# Patient Record
Sex: Male | Born: 1980 | Race: White | Hispanic: No | Marital: Single | Smoking: Current every day smoker
Health system: Southern US, Community
[De-identification: ages and names within clinical notes are randomized; demographics above are authoritative.]

## PROBLEM LIST (undated history)

## (undated) DIAGNOSIS — IMO0001 Reserved for inherently not codable concepts without codable children: Secondary | ICD-10-CM

## (undated) DIAGNOSIS — I10 Essential (primary) hypertension: Secondary | ICD-10-CM

## (undated) DIAGNOSIS — M545 Low back pain, unspecified: Secondary | ICD-10-CM

## (undated) DIAGNOSIS — J449 Chronic obstructive pulmonary disease, unspecified: Secondary | ICD-10-CM

## (undated) DIAGNOSIS — M47814 Spondylosis without myelopathy or radiculopathy, thoracic region: Secondary | ICD-10-CM

## (undated) DIAGNOSIS — F411 Generalized anxiety disorder: Secondary | ICD-10-CM

## (undated) DIAGNOSIS — E781 Pure hyperglyceridemia: Secondary | ICD-10-CM

## (undated) DIAGNOSIS — L4050 Arthropathic psoriasis, unspecified: Secondary | ICD-10-CM

## (undated) DIAGNOSIS — M47817 Spondylosis without myelopathy or radiculopathy, lumbosacral region: Secondary | ICD-10-CM

## (undated) DIAGNOSIS — F17200 Nicotine dependence, unspecified, uncomplicated: Secondary | ICD-10-CM

## (undated) DIAGNOSIS — M5380 Other specified dorsopathies, site unspecified: Secondary | ICD-10-CM

## (undated) DIAGNOSIS — M542 Cervicalgia: Secondary | ICD-10-CM

## (undated) DIAGNOSIS — M87051 Idiopathic aseptic necrosis of right femur: Secondary | ICD-10-CM

## (undated) HISTORY — DX: Spondylosis without myelopathy or radiculopathy, lumbosacral region: M47.817

## (undated) HISTORY — DX: Cervicalgia: M54.2

## (undated) HISTORY — DX: Reserved for inherently not codable concepts without codable children: IMO0001

## (undated) HISTORY — DX: Generalized anxiety disorder: F41.1

## (undated) HISTORY — DX: Spondylosis without myelopathy or radiculopathy, thoracic region: M47.814

## (undated) HISTORY — DX: Other specified dorsopathies, site unspecified: M53.80

## (undated) HISTORY — DX: Low back pain, unspecified: M54.50

## (undated) HISTORY — PX: TONSILLECTOMY AND ADENOIDECTOMY: SUR1326

## (undated) HISTORY — DX: Pure hyperglyceridemia: E78.1

## (undated) HISTORY — DX: Arthropathic psoriasis, unspecified: L40.50

## (undated) HISTORY — DX: Idiopathic aseptic necrosis of right femur: M87.051

## (undated) HISTORY — DX: Nicotine dependence, unspecified, uncomplicated: F17.200

## (undated) HISTORY — DX: Low back pain: M54.5

## (undated) HISTORY — DX: Essential (primary) hypertension: I10

---

## 2005-02-24 ENCOUNTER — Emergency Department (HOSPITAL_COMMUNITY): Admission: EM | Admit: 2005-02-24 | Discharge: 2005-02-24 | Payer: Self-pay | Admitting: *Deleted

## 2007-04-20 ENCOUNTER — Encounter: Admission: RE | Admit: 2007-04-20 | Discharge: 2007-04-20 | Payer: Self-pay | Admitting: Orthopedic Surgery

## 2008-04-27 ENCOUNTER — Ambulatory Visit (HOSPITAL_COMMUNITY): Admission: RE | Admit: 2008-04-27 | Discharge: 2008-04-28 | Payer: Self-pay | Admitting: Orthopedic Surgery

## 2008-04-29 HISTORY — PX: ROTATOR CUFF REPAIR: SHX139

## 2010-09-11 NOTE — Op Note (Signed)
[name], Tim                ACCOUNT NO.:  [id number]   MEDICAL RECORD NO.:  [id number]          PATIENT TYPE:  AMB   LOCATION:  DAY                          FACILITY:  [facility]   PHYSICIAN:  Ronald A. Gioffre, M.D.DATE OF BIRTH:  1980-05-07   DATE OF PROCEDURE:  04/27/2008  DATE OF DISCHARGE:                               OPERATIVE REPORT   SURGEON:  Georges Lynch. Darrelyn Hillock, M.D.   ASSISTANT:  Jamelle Rushing, P.A.   PREOPERATIVE DIAGNOSES:  1. Torn rotator cuff tendon, left shoulder.  2. Severe impingement syndrome, left shoulder.   POSTOPERATIVE DIAGNOSES:  1. Torn rotator cuff tendon, left shoulder.  2. Severe impingement syndrome, left shoulder.   OPERATION:  1. Partial acromionectomy and acromioplasty, open, left shoulder.  2. Repair of a torn rotator cuff tendon utilizing the TissueMend graft      with one anchor.   PROCEDURE:  Under general anesthesia, routine orthopedic prep and drape  of the left shoulder was carried out.  The patient had 650 mg of  clindamycin IV preop.  He also preop had an interscalene nerve block.  An incision was made over the anterior aspect of the left shoulder,  bleeders identified and cauterized.  I then inserted self-retaining  retractors.  I went down and partially detached the deltoid tendon by  sharp dissection from the acromion.  I noted he had really marked  downsloping of the acromion, the acromion was thickened as well.  I  split a small proximal part of the deltoid tendon and muscle as well.  At this time the [facility] retractor was inserted under the  acromion.  The rotator cuff was protected and I then utilized the oscillating saw  and did a partial acromionectomy and utilized the bur to even out the  undersurface of the acromion.  I then thoroughly irrigated the area and  then bone waxed the undersurface of the acromion.  I went down and  inspected the tendon.  The tendon was markedly thinned out as the MRI  stated.  There was at  least 50% thickness literally absent in regards to  the tendon.  Because of this I then utilized an overlay TissueMend graft  with 1 anchor and had a nice reinforcement of the tendon.  We did  reestablish the subacromial space.  We irrigated the area.  I then  loosely packed some thrombin-soaked Gelfoam up into the subacromial  space.  I reapproximated the deltoid tendon and muscle in the usual  fashion.  Subcu was closed with 0 Vicryl, the skin with metal staples.  Sterile Neosporin dressing was applied.  He was placed in a shoulder  immobilizer.           ______________________________  Georges Lynch Darrelyn Hillock, M.D.     RAG/MEDQ  D:  04/27/2008  T:  04/27/2008  Job:  [id number]

## 2010-10-11 ENCOUNTER — Other Ambulatory Visit (HOSPITAL_COMMUNITY): Payer: Self-pay | Admitting: Neurosurgery

## 2010-10-11 DIAGNOSIS — M542 Cervicalgia: Secondary | ICD-10-CM

## 2010-10-11 DIAGNOSIS — M545 Low back pain, unspecified: Secondary | ICD-10-CM

## 2010-10-11 DIAGNOSIS — M546 Pain in thoracic spine: Secondary | ICD-10-CM

## 2010-10-16 ENCOUNTER — Ambulatory Visit (HOSPITAL_COMMUNITY): Admission: RE | Admit: 2010-10-16 | Payer: Self-pay | Source: Ambulatory Visit

## 2010-10-16 ENCOUNTER — Ambulatory Visit (HOSPITAL_COMMUNITY)
Admission: RE | Admit: 2010-10-16 | Discharge: 2010-10-16 | Disposition: A | Discharge status: Home / Self Care | Payer: BC Managed Care – PPO | Source: Ambulatory Visit | Attending: Neurosurgery | Admitting: Neurosurgery

## 2010-10-16 ENCOUNTER — Ambulatory Visit (HOSPITAL_COMMUNITY): Payer: Self-pay

## 2010-10-16 DIAGNOSIS — M5020 Other cervical disc displacement, unspecified cervical region: Secondary | ICD-10-CM | POA: Insufficient documentation

## 2010-10-16 DIAGNOSIS — M542 Cervicalgia: Secondary | ICD-10-CM | POA: Insufficient documentation

## 2010-10-16 DIAGNOSIS — R209 Unspecified disturbances of skin sensation: Secondary | ICD-10-CM | POA: Insufficient documentation

## 2010-10-16 DIAGNOSIS — M545 Low back pain, unspecified: Secondary | ICD-10-CM

## 2010-10-16 DIAGNOSIS — M546 Pain in thoracic spine: Secondary | ICD-10-CM

## 2010-10-16 DIAGNOSIS — M4120 Other idiopathic scoliosis, site unspecified: Secondary | ICD-10-CM | POA: Insufficient documentation

## 2010-10-16 DIAGNOSIS — M5137 Other intervertebral disc degeneration, lumbosacral region: Secondary | ICD-10-CM | POA: Insufficient documentation

## 2010-10-16 DIAGNOSIS — M48061 Spinal stenosis, lumbar region without neurogenic claudication: Secondary | ICD-10-CM | POA: Insufficient documentation

## 2010-10-16 DIAGNOSIS — M4804 Spinal stenosis, thoracic region: Secondary | ICD-10-CM | POA: Insufficient documentation

## 2010-10-16 DIAGNOSIS — M5090 Cervical disc disorder, unspecified, unspecified cervical region: Secondary | ICD-10-CM | POA: Insufficient documentation

## 2010-10-16 DIAGNOSIS — R0789 Other chest pain: Secondary | ICD-10-CM | POA: Insufficient documentation

## 2010-10-16 DIAGNOSIS — M51379 Other intervertebral disc degeneration, lumbosacral region without mention of lumbar back pain or lower extremity pain: Secondary | ICD-10-CM | POA: Insufficient documentation

## 2010-10-16 DIAGNOSIS — M5124 Other intervertebral disc displacement, thoracic region: Secondary | ICD-10-CM | POA: Insufficient documentation

## 2010-10-16 DIAGNOSIS — M519 Unspecified thoracic, thoracolumbar and lumbosacral intervertebral disc disorder: Secondary | ICD-10-CM | POA: Insufficient documentation

## 2010-10-16 DIAGNOSIS — D1779 Benign lipomatous neoplasm of other sites: Secondary | ICD-10-CM | POA: Insufficient documentation

## 2010-10-18 ENCOUNTER — Encounter: Payer: Self-pay | Admitting: Family Medicine

## 2010-10-18 DIAGNOSIS — I10 Essential (primary) hypertension: Secondary | ICD-10-CM

## 2010-10-18 DIAGNOSIS — L4050 Arthropathic psoriasis, unspecified: Secondary | ICD-10-CM | POA: Insufficient documentation

## 2010-10-18 DIAGNOSIS — F411 Generalized anxiety disorder: Secondary | ICD-10-CM | POA: Insufficient documentation

## 2010-11-16 ENCOUNTER — Ambulatory Visit: Payer: BC Managed Care – PPO | Admitting: Neurosurgery

## 2010-11-21 ENCOUNTER — Encounter: Payer: BC Managed Care – PPO | Attending: Neurosurgery | Admitting: Neurosurgery

## 2010-11-21 DIAGNOSIS — M5020 Other cervical disc displacement, unspecified cervical region: Secondary | ICD-10-CM | POA: Insufficient documentation

## 2010-11-21 DIAGNOSIS — M545 Low back pain, unspecified: Secondary | ICD-10-CM | POA: Insufficient documentation

## 2010-11-21 DIAGNOSIS — M542 Cervicalgia: Secondary | ICD-10-CM | POA: Insufficient documentation

## 2010-11-21 DIAGNOSIS — M546 Pain in thoracic spine: Secondary | ICD-10-CM | POA: Insufficient documentation

## 2010-11-21 NOTE — Progress Notes (Signed)
ACCOUNT:  [id number].  Tim [name] is a 30 year old gentleman referred to [place] through Dr. Lowry Bowl office.  He was evaluated by Dr. Franky Macho for surgical intervention for complaints of back and neck pain.  He has been treated in the past at [facility] by Dr. Ethelene Hal where he received epidural steroid injections.  He tells me he got about a months' relief there, but no more.  Dr. Franky Macho evaluated him and referred him over for pain management due to the fact that he has been treated with narcotics in the past up to and including Dilaudid to another doctor and at some point in the past it had positive drug screen for cocaine.  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  He states that the last drug screen was just before his previous job.  He had passed the drug screen for his job and subsequently was given another drug screen by someone that showed cocaine, and the patient states he does not know how it got into his system.  Therefore, Dr. Franky Macho in conjunction with telling the patient he did not have anything surgical often that he also did not conduct pain management and would not prescribed narcotics.  He was accepted to this clinic on a nonnarcotic basis only.  The patient tells me today that he was not aware of nonnarcotic treatment and he feels like that narcotics should the only thing that helps.  The patient describes pain in his thoracic spine as well as his mid back and low back that radiates into his shoulders.  He rates his pain about 8.  It is a sharp, stabbing, constant pain, with paresthesia like activity.  He rates his activity level as 7.  Pain is the same 24 hours a day.  Sleep patterns are poor.  Bending, sitting, and most activities aggravate.  Heat and medication tend to help along with some injection.  Mobility, walks without assistance.  He climb steps and drive.   He does appear to be able to slowly rising from a seated position.  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  REVIEW OF SYSTEMS:  Notable for the difficulties as described above as well as some weakness, spasms, anxiety.  No suicidal thoughts.  His past physicians are Dr. Tanya Nones, his primary care at [facility], Dr. Franky Macho at [facility], as well as Dr. Ethelene Hal at [facility].  PAST MEDICAL HISTORY:  Significant for high blood pressure.  He has had trigger point injections in the past, epidurals, as well as rotator cuff surgery with Dr. Tinnie Gens in 2007.  SOCIAL HISTORY:  Single.  He drinks alcohol in the amount of one to two beverages a month.  He smokes about pack a day.  FAMILY HISTORY:  Significant for hypertension.  PHYSICAL EXAM:  VITAL SIGNS:  His blood pressure 161/85, pulse 90, respirations 18, O2 sats 99 on room air. CONSTITUTIONAL:  He appears to be within normal limits.  He is alert and oriented x3.  He has a normal gait even though it is a little slow to arise.  He does show me a ganglion cyst on his left wrist that he states it is fairly new and not bothersome to him. NEUROLOGIC:  His motor strength is 5/5 in the upper and lower extremities including deltoid, biceps, triceps, intrinsic,  grip, iliopsoas, quadriceps, dorsiflexors, EHL, and plantar flexors.  He can flex to about 60 degrees, extend about 10 degrees with pain.  His sensation is intact in the upper and lower extremities. He has point tender throughout the entire spine and the paraspinous musculature.  His reflexes are trace at the triceps; absent at the brachioradialis, triceps, biceps bilaterally; he has 2 at the quadriceps, 1 at the gastrocs.  Toes are downgoing.  He has a normal gait and stance.  ASSESSMENT: 1. Cervicalgia, unknown etiology. 2. Thoracic and low back pain.  MRIs dated October 16, 2010  were     reviewed.  Cervical spine shows small central protrusion at C5-6     with no annular tear.  No stenosis and neural foraminal     compression.  Thoracic spine is also negative.  He has a mild     thoracic scoliosis without any acute osseous abnormalities.  Lumbar     spine shows mild lumbar disease and facet degeneration, no spinal     stenosis, recess stenosis, or compression.  RECOMMENDATIONS: 1. We did talk about treatment options that are nonnarcotic.  The     patient states that he was unaware that he was going to be under     nonnarcotic treatment and the narcotics are the only thing that     helps.  We did talk about the future injections as well as     medications. The patient states that if I am not going to prescribe     narcotics "can I give him the name of the pain clinic in town."  I     told the patient that I do not know such an establishment, so I     would not give him the name. 2. He has agreed to nonnarcotic treatment for the time being, so I did     prescribe Soma 350 mg one p.o. b.i.d., #60 with no refill. 3. Celebrex 200 mg one p.o. daily, #30 with no refill, and we are     going to obtain urinary drug screen even though he is nonnarcotic     management.  The patient's questions were encouraged and answered.     We will see him back in the clinic with Dr. Wynn [name] in 1 month.     Tami Barren L. Blima Dessert Electronically Signed    RLW/MedQ D:  11/21/2010 09:50:05  T:  11/21/2010 10:57:51  Job #:  [id number]

## 2010-11-21 NOTE — Progress Notes (Signed)
ADDENDUM  Please add under his current medications amlodipine 10 mg daily and he was also prescribed hydrocodone 5/325 daily and Soma 350 mg 3 times a day.  He states the Tresa Garter was through Dr. Tanya Nones and the hydrocodone was through Dr. Franky Macho and neither physician will prescribe either drug again.  He does report drug allergies to SULFA drugs and it is hard to read this, but it looks like Ceclor.     Darlene Brozowski L. Blima Dessert Electronically Signed    RLW/MedQ D:  11/21/2010 09:51:30  T:  11/21/2010 10:53:27  Job #:  [id number]

## 2010-11-28 ENCOUNTER — Emergency Department (HOSPITAL_COMMUNITY)
Admission: EM | Admit: 2010-11-28 | Discharge: 2010-11-28 | Disposition: A | Discharge status: Home / Self Care | Payer: Worker's Compensation | Attending: Emergency Medicine | Admitting: Emergency Medicine

## 2010-11-28 DIAGNOSIS — R110 Nausea: Secondary | ICD-10-CM | POA: Insufficient documentation

## 2010-11-28 DIAGNOSIS — Z77098 Contact with and (suspected) exposure to other hazardous, chiefly nonmedicinal, chemicals: Secondary | ICD-10-CM | POA: Insufficient documentation

## 2010-11-28 DIAGNOSIS — I10 Essential (primary) hypertension: Secondary | ICD-10-CM | POA: Insufficient documentation

## 2010-12-28 ENCOUNTER — Encounter
Payer: BC Managed Care – PPO | Attending: Physical Medicine & Rehabilitation | Admitting: Physical Medicine & Rehabilitation

## 2010-12-28 DIAGNOSIS — M5380 Other specified dorsopathies, site unspecified: Secondary | ICD-10-CM

## 2010-12-28 DIAGNOSIS — M549 Dorsalgia, unspecified: Secondary | ICD-10-CM | POA: Insufficient documentation

## 2010-12-28 DIAGNOSIS — M5020 Other cervical disc displacement, unspecified cervical region: Secondary | ICD-10-CM | POA: Insufficient documentation

## 2010-12-28 DIAGNOSIS — M51379 Other intervertebral disc degeneration, lumbosacral region without mention of lumbar back pain or lower extremity pain: Secondary | ICD-10-CM | POA: Insufficient documentation

## 2010-12-28 DIAGNOSIS — M5124 Other intervertebral disc displacement, thoracic region: Secondary | ICD-10-CM | POA: Insufficient documentation

## 2010-12-28 DIAGNOSIS — M47814 Spondylosis without myelopathy or radiculopathy, thoracic region: Secondary | ICD-10-CM

## 2010-12-28 DIAGNOSIS — IMO0001 Reserved for inherently not codable concepts without codable children: Secondary | ICD-10-CM

## 2010-12-28 DIAGNOSIS — M47817 Spondylosis without myelopathy or radiculopathy, lumbosacral region: Secondary | ICD-10-CM

## 2010-12-28 DIAGNOSIS — M5137 Other intervertebral disc degeneration, lumbosacral region: Secondary | ICD-10-CM | POA: Insufficient documentation

## 2010-12-28 DIAGNOSIS — M62838 Other muscle spasm: Secondary | ICD-10-CM | POA: Insufficient documentation

## 2010-12-28 NOTE — Assessment & Plan Note (Signed)
HISTORY:  Tim [name] is back regarding his back pain.  He saw my nurse practitioner last month.  Nonnarcotic management was offered due to his past history of cocaine abuse, which was even very recent.  The patient agreed to follow up.  I reviewed his studies and thoracic spine is notable for multilevel facet disease as well as small disk protrusions at the T5-T6, T6-C7 and T8-T9 levels.  The lumbar spine is notable for some mild disk disease, eccentric to the left as the L4-L5 as well as facet hypertrophy at L4-L5, L5-S1.  The patient rates his pain 8/10, states it is stabbing, sharp, and aching.  It is related to his upper mid back as well as his low back. It seems to be worse with working as well as standing and can be worse if he sits for long periods of time.  Sleep is poor.  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  He does not have much time to rest with his work.  He was placed on Celebrex and Soma.  He takes Celebrex once a day and Soma twice a day and states he would do better with more frequent Soma.  REVIEW OF SYSTEMS:  Notable for the above.  He does have some anxiety. Full 12-point review is in the written health and history section of the chart.  SOCIAL HISTORY:  The patient is single.  Working as above.  PHYSICAL EXAMINATION:  VITAL SIGNS:  Blood pressure is 144/79, pulse is 80, respiratory rate 16, and satting 98% on room air. MUSCULOSKELETAL:  The patient has pain to palpation over the mid lumbar spine particularly at the paraspinals.  He has prominence of the paraspinal musculature, particularly at T6, T7, and T8.  He has pain in both paraspinal region at L4-S1.  He has a clockwise rotation of the thoracic and lumbar spine, perhaps by about 10 degrees.  Pain was noted with flexion, although more severe with extension and facet maneuvers of the lumbar spine.  Rotation caused minimal discomfort, but  side bending did cause pain both lower lumbar spine segments.  Strength is 5/5 in all four limbs.  Reflexes are 2+ and sensory exam is intact.  Overall posture is fair except for the spinal rotation.  ASSESSMENT:  Continues thoracic and lumbar spine pain.  It appears to be generally facet mediated in origin, although he has postural issues specifically the spinal clockwise rotation.  There is definite lumbar and thoracic disk disease, although I am not sure how much to role this is playing at this point specially with his lack of response to epidural injections in the past.  PLAN: 1. After informed consent, we injected four areas of spastic     muscle/trigger point in the T6-C7 paraspinals as well as lumbar     paraspinal muscles on either side.  A total of four injections of 2     mL of 1% lidocaine.  The patient tolerated well. 2. Continue his Soma q.8 h and Celebrex to b.i.d. for now. 3. Discussed appropriate stretching techniques particularly bending,     flexion, exercises during work.  He needs to be sensible about his     activities and how he does about things and he is to build in some     time for break some stretching while he works.  The type of work he     is doing is only  going to severely exacerbate the symptoms.     Ideally therapy would be offered here, although the patient states     he has a 50$ copay. 4. We will see the patient back in about 6 weeks' time.  He will call     with any problems or questions.     Ranelle Oyster, M.D. Electronically Signed    ZTS/MedQ D:  12/28/2010 10:30:23  T:  12/28/2010 11:23:30  Job #:  [id number]  cc:   Coletta Memos, M.D. Fax: [phone]

## 2011-02-01 LAB — DIFFERENTIAL
Basophils Absolute: 0 10*3/uL (ref 0.0–0.1)
Basophils Relative: 1 % (ref 0–1)
Eosinophils Absolute: 0.2 10*3/uL (ref 0.0–0.7)
Eosinophils Relative: 3 % (ref 0–5)
Lymphocytes Relative: 29 % (ref 12–46)
Lymphs Abs: 1.9 10*3/uL (ref 0.7–4.0)
Monocytes Absolute: 0.4 10*3/uL (ref 0.1–1.0)
Monocytes Relative: 6 % (ref 3–12)
Neutro Abs: 4 10*3/uL (ref 1.7–7.7)
Neutrophils Relative %: 62 % (ref 43–77)

## 2011-02-01 LAB — APTT: aPTT: 28 seconds (ref 24–37)

## 2011-02-01 LAB — COMPREHENSIVE METABOLIC PANEL
ALT: 82 U/L — ABNORMAL HIGH (ref 0–53)
AST: 44 U/L — ABNORMAL HIGH (ref 0–37)
Albumin: 4.2 g/dL (ref 3.5–5.2)
Alkaline Phosphatase: 79 U/L (ref 39–117)
BUN: 10 mg/dL (ref 6–23)
CO2: 24 mEq/L (ref 19–32)
Calcium: 9.8 mg/dL (ref 8.4–10.5)
Chloride: 103 mEq/L (ref 96–112)
Creatinine, Ser: 0.77 mg/dL (ref 0.4–1.5)
GFR calc Af Amer: >60 mL/min (ref >60)
GFR calc non Af Amer: >60 mL/min (ref >60)
Glucose, Bld: 104 mg/dL — ABNORMAL HIGH (ref 70–99)
Potassium: 3.9 mEq/L (ref 3.5–5.1)
Sodium: 137 mEq/L (ref 135–145)
Total Bilirubin: 0.8 mg/dL (ref 0.3–1.2)
Total Protein: 6.7 g/dL (ref 6.0–8.3)

## 2011-02-01 LAB — URINALYSIS, ROUTINE W REFLEX MICROSCOPIC
Bilirubin Urine: NEGATIVE
Glucose, UA: NEGATIVE mg/dL
Hgb urine dipstick: NEGATIVE
Ketones, ur: NEGATIVE mg/dL
Nitrite: NEGATIVE
Protein, ur: NEGATIVE mg/dL
Specific Gravity, Urine: 1.019 (ref 1.005–1.030)
Urobilinogen, UA: 0.2 mg/dL (ref 0.0–1.0)
pH: 6 (ref 5.0–8.0)

## 2011-02-01 LAB — URINE CULTURE
Colony Count: NO GROWTH
Culture: NO GROWTH
Special Requests: NEGATIVE

## 2011-02-01 LAB — TYPE AND SCREEN
ABO/RH(D): O POS
Antibody Screen: NEGATIVE

## 2011-02-01 LAB — CBC
HCT: 42.5 % (ref 39.0–52.0)
Hemoglobin: 14.9 g/dL (ref 13.0–17.0)
MCHC: 35 g/dL (ref 30.0–36.0)
MCV: 90.3 fL (ref 78.0–100.0)
Platelets: 239 10*3/uL (ref 150–400)
RBC: 4.7 MIL/uL (ref 4.22–5.81)
RDW: 12.6 % (ref 11.5–15.5)
WBC: 6.5 10*3/uL (ref 4.0–10.5)

## 2011-02-01 LAB — ABO/RH: ABO/RH(D): O POS

## 2011-02-01 LAB — PROTIME-INR
INR: 1 (ref 0.00–1.49)
Prothrombin Time: 12.8 seconds (ref 11.6–15.2)

## 2011-02-06 ENCOUNTER — Encounter
Payer: BC Managed Care – PPO | Attending: Physical Medicine & Rehabilitation | Admitting: Physical Medicine & Rehabilitation

## 2011-02-06 DIAGNOSIS — M62838 Other muscle spasm: Secondary | ICD-10-CM | POA: Insufficient documentation

## 2011-02-06 DIAGNOSIS — F411 Generalized anxiety disorder: Secondary | ICD-10-CM | POA: Insufficient documentation

## 2011-02-06 DIAGNOSIS — R110 Nausea: Secondary | ICD-10-CM | POA: Insufficient documentation

## 2011-02-06 DIAGNOSIS — M47814 Spondylosis without myelopathy or radiculopathy, thoracic region: Secondary | ICD-10-CM

## 2011-02-06 DIAGNOSIS — IMO0001 Reserved for inherently not codable concepts without codable children: Secondary | ICD-10-CM

## 2011-02-06 DIAGNOSIS — M545 Low back pain, unspecified: Secondary | ICD-10-CM | POA: Insufficient documentation

## 2011-02-06 DIAGNOSIS — M546 Pain in thoracic spine: Secondary | ICD-10-CM | POA: Insufficient documentation

## 2011-02-06 DIAGNOSIS — M47812 Spondylosis without myelopathy or radiculopathy, cervical region: Secondary | ICD-10-CM

## 2011-02-06 DIAGNOSIS — F17200 Nicotine dependence, unspecified, uncomplicated: Secondary | ICD-10-CM | POA: Insufficient documentation

## 2011-02-06 NOTE — Assessment & Plan Note (Signed)
HISTORY OF PRESENT ILLNESS:  Pilot is back regarding his thoracic low back pain.  We injected the thoracic paraspinals at last visit, as well as lumbar paraspinals and he had good results.  He is having some new neck pain now.  The injections lasted for mid back around 3 weeks.  Low back injection perhaps a bit longer.  Neck spasm when he looks up as well with rotation sometimes.  Pain overall is 9/10.  He is taking his Celebrex twice a day.  He does not notice a dramatic difference with that.  He has had no GI side effects.  REVIEW OF SYSTEMS:  Notable for spasms in the back as well as anxiety issues and nausea.  Full 12-point review is in the written health and history section of the chart.  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  He is smoking 1 pack of cigarettes per day and rarely is drinking.  PHYSICAL EXAMINATION:  VITAL SIGNS:  Blood pressure is 148/81, pulse is 96, respiratory 16, and saturating 100% on room air. GENERAL:  The patient is pleasant, alert, and oriented x3.  He is still large build and abdominally obese. NEUROLOGIC:  Neck is notable for some tightness over the paraspinals at the C6-C7 area.  It is involving perhaps the trapezius at the level as well.  He has familiar areas of spasm along the T6 and T7 paraspinal muscles.  Posture in the sitting was fair.  Strength is 5/5.  Normal sensory function.  Some mild rotator cuff signs in the shoulders. Cognitively, he is alert and appropriate.  Behavior was appropriate as well.  ASSESSMENT:  Axial pain involving the cervical thoracic and lumbar spine.  Some of this again is myofascial.  His MRI of the cervical spine was really unremarkable from June 2012, except for a small disk protrusion at C5-C6.  He had more notable findings in the thoracic spine at multiple levels from T1-T9.  At the T6 level, he has small disk protrusion as well as facet arthropathy.  Currently, his back  pain is multifactorial.  His work is not helping matters.  He has lower lumbar findings on his MRI as well.  PLAN: 1. After informed consent, we injected the T6-T7 paraspinals x2 with 2     mL 1% lidocaine.  Also injected the C6-C7 level with 1 trigger     point injections consisting of 2 mL of 1% lidocaine.  The patient     it tolerated well.  He did have some mild anxiety prior to the     injections but tolerated them nicely as a whole. 2. I placed him on a prednisone taper over 2 weeks time. 3. Consider thoracic epidural versus facet blocks. 4. Consider therapy trial. 5. Continue Soma q.8 hours.  He will still stop Celebrex for the time     being. 6. He is to work on weight loss. 7. He also needs to consider other alternatives for work as his job is     not helping matters. 8. At this point, I will schedule him back for 6 weeks.     Ranelle Oyster, M.D. Electronically Signed    ZTS/MedQ D:  02/06/2011 10:15:39  T:  02/06/2011 11:12:49  Job #:  [id number]

## 2011-03-19 ENCOUNTER — Encounter
Payer: BC Managed Care – PPO | Attending: Physical Medicine & Rehabilitation | Admitting: Physical Medicine & Rehabilitation

## 2011-03-19 DIAGNOSIS — X500XXA Overexertion from strenuous movement or load, initial encounter: Secondary | ICD-10-CM | POA: Insufficient documentation

## 2011-03-19 DIAGNOSIS — M545 Low back pain, unspecified: Secondary | ICD-10-CM

## 2011-03-19 DIAGNOSIS — M7530 Calcific tendinitis of unspecified shoulder: Secondary | ICD-10-CM

## 2011-03-19 DIAGNOSIS — M47814 Spondylosis without myelopathy or radiculopathy, thoracic region: Secondary | ICD-10-CM

## 2011-03-19 DIAGNOSIS — M129 Arthropathy, unspecified: Secondary | ICD-10-CM | POA: Insufficient documentation

## 2011-03-19 DIAGNOSIS — IMO0002 Reserved for concepts with insufficient information to code with codable children: Secondary | ICD-10-CM | POA: Insufficient documentation

## 2011-03-19 DIAGNOSIS — M5380 Other specified dorsopathies, site unspecified: Secondary | ICD-10-CM

## 2011-03-19 NOTE — Assessment & Plan Note (Signed)
HISTORY:  Tim [name] is back regarding his back pain.  He was putting up Christmas lights this weekend and strained his right shoulder.  His mid back has been doing a bit better after trigger point injections.  His low back is acting up again.  At last visit, we injected multiple spots on the mid thoracic and lower cervical spine and paraspinals.  He has responded well to these.  He has had problems with left shoulder before with rotator cuff labrum.  Pain is 10/10 today.  He is using his Celebrex as well as Herbalist.  He is not using the Zanaflex.  He did notice a big difference with the prednisone taper.  REVIEW OF SYSTEMS:  Notable for spasms, depression, anxiety.  Full 12- point review is in the written health and history section of the chart.  SOCIAL HISTORY:  Unchanged.  He lives with a 10-year-old son.  PHYSICAL EXAMINATION:  VITAL SIGNS:  Blood pressure is 146/84, pulse is 105, respiratory 16, and he is satting 98% on room air. GENERAL: The patient is pleasant, alert, oriented. MUSCULOSKELETAL:  He is unchanged from a postural standpoint.  Right shoulder is painful with rotator cuff impingement.  And also with an impingement maneuver seems to be positive.  He does not have any functional weakness of the right shoulder except for the pain in inner seeds.  Sensory exam is grossly intact.  He has tenderness over the lumbar paraspinals particularly at the L4-L5, L5-S1 levels.  Thoracic spine is less tender today with palpation seemed to have better scapular movement on exam.  ASSESSMENT: 1. Continued axial spine pain.  Lumbar spine seems to be most     asymptomatic at this point.  He does have a disk bulge eccentric to     the left at L4-L5 with facet arthropathy as well.  At that level as     well as L5-S1.  Exam is most consistent with a facet problem 2. Right shoulder injury consistent with rotator cuff pathology versus     labral pathology.  PLANS: 1. We will set up the patient  for medial branch blocks at bilaterally     at L4-L5, L5-S1 per Dr. Wynn Banker. 2. After informed consent, I injected the trigger points in the lower     lumbar spine and the lumbar spine paraspinals with the L4-L5 levels     as well.  The patient tolerated well and had some relief before he     left the office today. 3. After informed consent, we injected the right shoulder via     posterior lateral approach with 40 mg Kenalog and 3 mL of 1%     lidocaine.  The patient tolerated it well.  I asked the patient to     be careful with activity initially and slowly resume active range     of motion as tolerated with the shoulder.  If he has further     difficulties may need further workup here. 4. The patient again asked regarding narcotic medications and I have     told him that would not be prescribing them for him given his risk     profile. 5. Again he needs to consider other job alternatives as his job     certainly continue to exacerbate his symptoms.  He is a Glass blower/designer. 6. I will see the patient back after he completes injection.     Ranelle Oyster, M.D. Electronically Signed   ZTS/MedQ D:  03/19/2011 12:14:42  T:  03/19/2011 19:59:23  Job #:  [id number]

## 2011-04-15 ENCOUNTER — Encounter: Payer: BC Managed Care – PPO | Attending: Physical Medicine & Rehabilitation

## 2011-04-15 ENCOUNTER — Encounter (HOSPITAL_BASED_OUTPATIENT_CLINIC_OR_DEPARTMENT_OTHER): Payer: BC Managed Care – PPO | Admitting: Physical Medicine & Rehabilitation

## 2011-04-15 DIAGNOSIS — M545 Low back pain, unspecified: Secondary | ICD-10-CM

## 2011-04-15 DIAGNOSIS — R110 Nausea: Secondary | ICD-10-CM | POA: Insufficient documentation

## 2011-04-15 DIAGNOSIS — F17200 Nicotine dependence, unspecified, uncomplicated: Secondary | ICD-10-CM | POA: Insufficient documentation

## 2011-04-15 DIAGNOSIS — M62838 Other muscle spasm: Secondary | ICD-10-CM | POA: Insufficient documentation

## 2011-04-15 DIAGNOSIS — F411 Generalized anxiety disorder: Secondary | ICD-10-CM | POA: Insufficient documentation

## 2011-04-15 DIAGNOSIS — M5380 Other specified dorsopathies, site unspecified: Secondary | ICD-10-CM

## 2011-04-15 DIAGNOSIS — M546 Pain in thoracic spine: Secondary | ICD-10-CM | POA: Insufficient documentation

## 2011-04-15 NOTE — Procedures (Signed)
NAMEALFIO, LOESCHER                ACCOUNT NO.:  [id number]  MEDICAL RECORD NO.:  [id number]           PATIENT TYPE:  O  LOCATION:  [facility]                          FACILITY:  [facility]  PHYSICIAN:  Erick Colace, M.D.DATE OF BIRTH:  02-19-1981  DATE OF PROCEDURE:  04/15/2011 DATE OF DISCHARGE:                              OPERATIVE REPORT  PROCEDURE:  Bilateral L5 dorsal ramus injection, bilateral L4 and bilateral L3 medial branch blocks under fluoroscopic guidance.  INDICATION:  Lumbar pain which is only partially response to medication management, rated as 9/10, interfering with walking, bending, sitting, standing.  Informed consent was obtained after describing risks and benefits of the procedure.  I used a spine model to explain the procedure in detail.  He elects to proceed and has given written consent.  The patient placed prone on fluoroscopy table.  Betadine prep, sterile drape, 25-gauge inch and a half needle was used to anesthetize skin and subcu tissue 1% lidocaine x2 mL.  Then, a 22-gauge 3-1/2-inch spinal needle was inserted under fluoroscopic guidance starting the S1 SAP sacral ala junction bone contact made, confirmed with Omnipaque 180 x0.5 mL demonstrated no intravascular uptake.  Then, 0.5 mL of solution containing 1 mL of 4 mg/mL dexamethasone and 2 mL of 2% MPF lidocaine was injected.  Then, left L5 SAP transverse process junction targeted, bone contact made. Omnipaque 180 x0.5 mL demonstrated no intravascular uptake.  Then, 0.5 mL of the dexamethasone-lidocaine solution was injected.  Then, the left L4 SAP transverse process junction targeted, bone contact made. Omnipaque 180 x0.5 mL demonstrated no intravascular uptake.  Then, 0.5 mL dexamethasone-lidocaine solution injected.  Then, left L4 SAP transverse process junction targeted, bone contact made.  Omnipaque 180 x0.5 mL demonstrated no intravascular uptake.  Then, 0.5 mL of dexamethasone-lidocaine solution  was injected.  The same procedure was repeated on the right side using same needle, injectate, and technique. The patient tolerated procedure well.  Pre and post injection vitals stable.  Post injection instructions given.     Erick Colace, M.D. Electronically Signed    AEK/MEDQ  D:  04/15/2011 10:50:20  T:  04/15/2011 12:35:35  Job:  [id number]

## 2011-05-20 ENCOUNTER — Encounter
Payer: BC Managed Care – PPO | Attending: Physical Medicine & Rehabilitation | Admitting: Physical Medicine & Rehabilitation

## 2011-05-20 DIAGNOSIS — IMO0001 Reserved for inherently not codable concepts without codable children: Secondary | ICD-10-CM

## 2011-05-20 DIAGNOSIS — M62838 Other muscle spasm: Secondary | ICD-10-CM | POA: Insufficient documentation

## 2011-05-20 DIAGNOSIS — M549 Dorsalgia, unspecified: Secondary | ICD-10-CM | POA: Insufficient documentation

## 2011-05-20 DIAGNOSIS — M542 Cervicalgia: Secondary | ICD-10-CM

## 2011-05-20 DIAGNOSIS — M545 Low back pain, unspecified: Secondary | ICD-10-CM

## 2011-05-20 DIAGNOSIS — M47814 Spondylosis without myelopathy or radiculopathy, thoracic region: Secondary | ICD-10-CM

## 2011-05-20 DIAGNOSIS — M47817 Spondylosis without myelopathy or radiculopathy, lumbosacral region: Secondary | ICD-10-CM | POA: Insufficient documentation

## 2011-05-20 DIAGNOSIS — M129 Arthropathy, unspecified: Secondary | ICD-10-CM | POA: Insufficient documentation

## 2011-05-20 NOTE — Assessment & Plan Note (Signed)
Tim [name] is back regarding his chronic back pain.  Pain is about 9/10. He had a medial branch blocks at L4-L5, L5-S1  per Dr. Wynn Banker at last visit, and had no benefit whatsoever.  He is having pain up on the right side of the back which is more superior than it had been previously. The Tim [name] helps him slightly.  He is not using the Zanaflex anymore.  He is off the prednisone.  He stopped the Celebrex also.  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  He states he is in constant pain.  REVIEW OF SYSTEMS:  Notable for anxiety, spasms, tingling, depression. Full 12-point review is in the written health and history section of the chart.  SOCIAL HISTORY:  As noted above.  PHYSICAL EXAMINATION:  VITAL SIGNS:  Blood pressure is 148/93, pulse 88, respiratory rate 18, he is satting 100% on room air. GENERAL:  The patient is pleasant, alert.  He is in no acute distress. MUSCULOSKELETAL:  On examining the back, there was some pain over the lower lumbar spine segment at the L5-S1 interface.  No focal spasm was appreciated there.  He did have some muscle spasm on the right paraspinals in the L2-L4 region which was pan tender with palpation.  It was limited with flexion to about 30-40 degrees and extension to about 15 degrees.  Lateral bending and rotation was achieved either side to about 25 degrees or so.  PSYCHIATRIC:  Cognitively, he was generally intact.  He is a bit nervous per baseline.  ASSESSMENT:  Continued axial spine pain.  He has more symptoms in the lower lumbar spine at this point.  I think this is multifactorial related to his spondylosis and facet arthropathy as well as myofascial pain.  The biggest factor currently is his ongoing work which is only exacerbating the problem.  PLAN: 1. I injected after informed consent, the right lumbar trigger points     with 2 injections each consisting of 2 mL of 1% lidocaine.  The     patient tolerated well. 2. He suggested to me that  he was going to purchase an inversion table     and I gave him prescription to help him on the purchase     potentially.  This could be useful given his spinal issues. 3. I gave him tramadol to use for breakthrough pain 50 mg 1 q.8 hours     p.r.n. 4. Initiate naproxen 500 mg 1 q.12 hours #60. 5. Weight loss would be helpful as well as an active stretching     program. 6. 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  I again reinforced the patient that I     would not be using narcotic medications and he understands this.  I     will consider using perhaps antidepressant or an anticonvulsant to     treat him for his pain as well given his mood issues and the     chronicity of this pain.  Otherwise, I will see him back in 2     month's time.  He will call me with any problems or questions.     Ranelle Oyster, M.D. Electronically Signed    ZTS/MedQ D:  05/20/2011 13:12:45  T:  05/20/2011 20:21:38  Job #:  [id number] 

## 2011-06-24 ENCOUNTER — Other Ambulatory Visit: Payer: Self-pay | Admitting: *Deleted

## 2011-06-24 MED ORDER — CARISOPRODOL 350 MG PO TABS: 350.0000 mg | ORAL_TABLET | Freq: Three times a day (TID) | ORAL | Status: DC | PRN

## 2011-07-09 ENCOUNTER — Other Ambulatory Visit: Payer: Self-pay | Admitting: *Deleted

## 2011-07-09 MED ORDER — TRAMADOL HCL 50 MG PO TABS: 50.0000 mg | ORAL_TABLET | Freq: Three times a day (TID) | ORAL | Status: DC | PRN

## 2011-07-22 ENCOUNTER — Ambulatory Visit: Payer: Self-pay | Admitting: Physical Medicine & Rehabilitation

## 2011-07-22 ENCOUNTER — Encounter: Payer: Self-pay | Admitting: Physical Medicine & Rehabilitation

## 2011-07-22 ENCOUNTER — Encounter
Payer: BC Managed Care – PPO | Attending: Physical Medicine & Rehabilitation | Admitting: Physical Medicine & Rehabilitation

## 2011-07-22 VITALS — BP 148/93 | HR 82 | Resp 16 | Ht 69.0 in | Wt 250.0 lb

## 2011-07-22 DIAGNOSIS — M47816 Spondylosis without myelopathy or radiculopathy, lumbar region: Secondary | ICD-10-CM

## 2011-07-22 DIAGNOSIS — M47814 Spondylosis without myelopathy or radiculopathy, thoracic region: Secondary | ICD-10-CM | POA: Insufficient documentation

## 2011-07-22 DIAGNOSIS — IMO0001 Reserved for inherently not codable concepts without codable children: Secondary | ICD-10-CM | POA: Insufficient documentation

## 2011-07-22 DIAGNOSIS — F411 Generalized anxiety disorder: Secondary | ICD-10-CM

## 2011-07-22 DIAGNOSIS — M47817 Spondylosis without myelopathy or radiculopathy, lumbosacral region: Secondary | ICD-10-CM | POA: Insufficient documentation

## 2011-07-22 MED ORDER — FENTANYL 25 MCG/HR TD PT72: 1.0000 | MEDICATED_PATCH | TRANSDERMAL | Status: DC

## 2011-07-22 NOTE — Patient Instructions (Signed)
You need to seek other avenues for training and education which are less physically intense

## 2011-07-22 NOTE — Progress Notes (Signed)
  Subjective:    Patient ID: Tim [name], male    DOB: 05-Jan-1981, 31 y.o.   MRN: [id number]  HPI  Tim [name] is back regarding his back pain. He says there are days when he can't get out of bed. He's had to call in sick to work.  He doesn't want to quit work, but it is all that he knows.   Pain remains generally in low to mid back into shoulder.  Pain is made worse by any activity really.  He had to go to the ED recently, because of increased pain.   Pain Inventory Average Pain 10 Pain Right Now 10 My pain is sharp, stabbing, tingling and aching  In the last 24 hours, has pain interfered with the following? General activity 9 Relation with others 9 Enjoyment of life 10 What TIME of day is your pain at its worst? All Day Sleep (in general) Fair  Pain is worse with: walking, bending, sitting, standing and some activites Pain improves with: rest, heat/ice, medication, TENS and injections Relief from Meds: 7  Mobility walk without assistance ability to climb steps?  yes do you drive?  yes  Function employed # of hrs/week 50+  Neuro/Psych depression anxiety  Prior Studies Any changes since last visit?  no  Physicians involved in your care Any changes since last visit?  no      Review of Systems  Constitutional: Negative.   HENT: Negative.   Eyes: Negative.   Respiratory: Negative.   Cardiovascular: Negative.   Gastrointestinal: Negative.   Genitourinary: Negative.   Musculoskeletal: Negative.   Skin: Negative.   Neurological: Negative.   Hematological: Negative.   Psychiatric/Behavioral: Negative.        Objective:   Physical Exam  Constitutional: He is oriented to person, place, and time. He appears well-developed and well-nourished.  HENT:  Head: Normocephalic.  Eyes: EOM are normal. Pupils are equal, round, and reactive to light.  Cardiovascular: Normal rate and regular rhythm.   Pulmonary/Chest: Effort normal and breath sounds normal.    Abdominal: Soft.  Musculoskeletal: Normal range of motion.       Multiple areas of spasm over right thoracic/lumbar paraspinals.  Worsened with rotation an low back pain.  No gross motor and sensory changes.  Neurological: He is alert and oriented to person, place, and time.  Skin: Skin is warm.  Psychiatric: He has a normal mood and affect. His behavior is normal. Judgment and thought content normal.          Assessment & Plan:  ASSESSMENT: Continued axial spine pain. He has more symptoms in the  lower lumbar spine at this point. I think this is multifactorial  related to his spondylosis and facet arthropathy as well as myofascial  pain. The biggest factor currently is his ongoing work which is only  exacerbating the problem.  PLAN:  1.Will initiate fentanyl patch for baseline pain control.  I cautioned the patient that this was a bandaid of sorts, and that his job would continue to exacerbate his pain condition 2. He is still seeking the inversion table 3.Tramadol helps somewhat for breakthrough pain 50 mg 1 q.8 hours  p.r.n.  4 .Naproxen 500 mg 1 q.12 hours #60.  5. Weight loss would be helpful as well as an active stretching  program.  6. He will f/u with RN next month for refills.

## 2011-07-31 ENCOUNTER — Telehealth: Payer: Self-pay | Admitting: *Deleted

## 2011-07-31 MED ORDER — TRAMADOL HCL 50 MG PO TABS: 50.0000 mg | ORAL_TABLET | Freq: Three times a day (TID) | ORAL | Status: DC | PRN

## 2011-07-31 NOTE — Telephone Encounter (Signed)
Ok, i increased to #90

## 2011-07-31 NOTE — Telephone Encounter (Signed)
Received fax request for refill on tramadol q8h prn #60. Last fill 07/09/11. Refill early or change # prescribed?

## 2011-08-01 ENCOUNTER — Telehealth: Payer: Self-pay | Admitting: *Deleted

## 2011-08-01 DIAGNOSIS — IMO0001 Reserved for inherently not codable concepts without codable children: Secondary | ICD-10-CM

## 2011-08-01 DIAGNOSIS — M47814 Spondylosis without myelopathy or radiculopathy, thoracic region: Secondary | ICD-10-CM

## 2011-08-01 DIAGNOSIS — M47816 Spondylosis without myelopathy or radiculopathy, lumbar region: Secondary | ICD-10-CM

## 2011-08-01 NOTE — Telephone Encounter (Signed)
Last appointment with Dr Riley Kill he was put on Fentanyl Patch 25 mcg.  He has not been able to get them to stick past 2 days (probably because of sweat while he was working) and now is needing ore patches and some suggestions about what he can do to make them stick.

## 2011-08-02 ENCOUNTER — Telehealth: Payer: Self-pay | Admitting: *Deleted

## 2011-08-02 NOTE — Telephone Encounter (Signed)
Duplicate encounter

## 2011-08-02 NOTE — Telephone Encounter (Signed)
We were not able to get a new Rx for him today. We will check with Dr Riley Kill on Monday. Use of Ban roll-on antiperspirant under patch is sometimes effective. Can also try use of surgical or bandage tape over the patch. Getting relief w/ the patch but can't keep it on.

## 2011-08-05 ENCOUNTER — Telehealth: Payer: Self-pay | Admitting: *Deleted

## 2011-08-05 MED ORDER — FENTANYL 25 MCG/HR TD PT72: 1.0000 | MEDICATED_PATCH | TRANSDERMAL | Status: DC

## 2011-08-05 NOTE — Telephone Encounter (Signed)
Anitperspirant, bandage.  Can also contact company about over-patch.  i refilled patch

## 2011-08-05 NOTE — Telephone Encounter (Signed)
Rx for 1 box of patches ready for pickup. Reviewed methods of helping with adhesive.

## 2011-08-06 ENCOUNTER — Telehealth: Payer: Self-pay | Admitting: Physical Medicine & Rehabilitation

## 2011-08-06 NOTE — Telephone Encounter (Signed)
Fentanyl helping tremendously.  Will Dr refill?

## 2011-08-07 NOTE — Telephone Encounter (Signed)
Pt aware that his rx is ready for pick up. He actually picked this up yesterday.

## 2011-08-14 ENCOUNTER — Other Ambulatory Visit: Payer: Self-pay | Admitting: Physical Medicine & Rehabilitation

## 2011-08-14 DIAGNOSIS — M47814 Spondylosis without myelopathy or radiculopathy, thoracic region: Secondary | ICD-10-CM

## 2011-08-14 DIAGNOSIS — IMO0001 Reserved for inherently not codable concepts without codable children: Secondary | ICD-10-CM

## 2011-08-14 DIAGNOSIS — M47816 Spondylosis without myelopathy or radiculopathy, lumbar region: Secondary | ICD-10-CM

## 2011-08-14 NOTE — Telephone Encounter (Signed)
Tim [name] has been called into [facility]. Pt aware that I will have to speak with Dr. Wynn Banker in the morning about refilling his Fentanyl since Dr. Riley Kill isn't here. He agrees.

## 2011-08-14 NOTE — Telephone Encounter (Signed)
Tim [name] told to call back concerning Fentanyl.  Also needs Soma.

## 2011-08-15 MED ORDER — FENTANYL 25 MCG/HR TD PT72: 1.0000 | MEDICATED_PATCH | TRANSDERMAL | Status: DC

## 2011-08-15 NOTE — Telephone Encounter (Signed)
Addended by: Caryl Ada on: 08/15/2011 07:59 AM   Modules accepted: Orders

## 2011-08-16 NOTE — Telephone Encounter (Signed)
Pt aware rx is ready for pickup.  

## 2011-08-28 ENCOUNTER — Encounter: Payer: Self-pay | Admitting: Physical Medicine & Rehabilitation

## 2011-08-28 ENCOUNTER — Encounter
Payer: BC Managed Care – PPO | Attending: Physical Medicine & Rehabilitation | Admitting: Physical Medicine & Rehabilitation

## 2011-08-28 VITALS — BP 156/84 | HR 95 | Resp 16 | Ht 69.0 in | Wt 244.4 lb

## 2011-08-28 DIAGNOSIS — M47816 Spondylosis without myelopathy or radiculopathy, lumbar region: Secondary | ICD-10-CM

## 2011-08-28 DIAGNOSIS — M47817 Spondylosis without myelopathy or radiculopathy, lumbosacral region: Secondary | ICD-10-CM

## 2011-08-28 DIAGNOSIS — M47814 Spondylosis without myelopathy or radiculopathy, thoracic region: Secondary | ICD-10-CM | POA: Insufficient documentation

## 2011-08-28 DIAGNOSIS — IMO0001 Reserved for inherently not codable concepts without codable children: Secondary | ICD-10-CM

## 2011-08-28 MED ORDER — FENTANYL 25 MCG/HR TD PT72: 1.0000 | MEDICATED_PATCH | TRANSDERMAL | Status: DC

## 2011-08-28 MED ORDER — CARISOPRODOL 350 MG PO TABS: 350.0000 mg | ORAL_TABLET | Freq: Three times a day (TID) | ORAL | Status: DC | PRN

## 2011-08-28 NOTE — Patient Instructions (Signed)
Continue working on your stretching, posture, weight loss, etc

## 2011-08-28 NOTE — Progress Notes (Signed)
Subjective:    Patient ID: Tim [name], male    DOB: 05-05-1980, 31 y.o.   MRN: [id number]  HPI  Tim [name] is back regarding his chronic  back pain. He finds that the fentanyl patches are helping quite a bit.  He is able to stretch in the AM and as needed during the day.  The only issue with the patch is that it sometimes comes off when he sweats.  He will get the over-patches from the manufacturer with his next script.  He has applied for a different, lower intensity job and is hopeful with that prospect.    He irritated his left shoulder working doing movement of larger objects.  He has noticed some cysts on his wrists which sometimes swell. The one on the left occasionally breaks and goes down.    Pain Inventory Average Pain 8 Pain Right Now 8 My pain is constant, sharp, stabbing and aching  In the last 24 hours, has pain interfered with the following? General activity 8 Relation with others 8 Enjoyment of life 8 What TIME of day is your pain at its worst? all of the time Sleep (in general) Fair  Pain is worse with: walking, bending, sitting, standing and some activites Pain improves with: heat/ice, medication and TENS Relief from Meds: 7  Mobility walk without assistance how many minutes can you walk? 30 ability to climb steps?  yes do you drive?  yes Do you have any goals in this area?  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  yes  Neuro/Psych spasms depression anxiety  Prior Studies Any changes since last visit?  no  Physicians involved in your care Any changes since last visit?  no     Review of Systems  Musculoskeletal:       Spasms  Psychiatric/Behavioral: Positive for dysphoric mood. The patient is nervous/anxious.   All other systems reviewed and are negative.       Objective:   Physical Exam  Constitutional: He appears well-developed and well-nourished.  HENT:  Head:  Normocephalic and atraumatic.  Eyes: Conjunctivae and EOM are normal. Pupils are equal, round, and reactive to light.  Neck: Normal range of motion. Neck supple.  Cardiovascular: Normal rate and regular rhythm.   Pulmonary/Chest: Effort normal and breath sounds normal.  Abdominal: Soft. Bowel sounds are normal.  Musculoskeletal:       Lumbar back: He exhibits decreased range of motion, tenderness, bony tenderness, pain and spasm.       A ganglion cyst is present on either wrist. These are slightly tender to touch.   Shoulder with mild pain with ER and IR.  Psychiatric: He has a normal mood and affect. His behavior is normal. Judgment and thought content normal.          Assessment & Plan:  ASSESSMENT:  1. Continued axial spine pain which is multifactorial. There is thoracic and lumbar spondylosis and myofascial pain as well. I'm encouraged by his efforts to find sedentary, lighter duty work. Marland Kitchen  PLAN:  1. Refill fentanyl 81mc/hr. He is to receive the over-patch to help with adhesion 2. He is still seeking the inversion table  3.Tramadol helps somewhat for breakthrough pain 50 mg 1 q.8 hours  p.r.n.  4 .Naproxen 500 mg 1 q.12 hours #60.  5. Continue weight loss efforts and stretching before, during and after work 6. He will f/u with my PA next month

## 2011-09-24 ENCOUNTER — Telehealth: Payer: Self-pay | Admitting: Physical Medicine & Rehabilitation

## 2011-09-24 DIAGNOSIS — M47816 Spondylosis without myelopathy or radiculopathy, lumbar region: Secondary | ICD-10-CM

## 2011-09-24 DIAGNOSIS — M47814 Spondylosis without myelopathy or radiculopathy, thoracic region: Secondary | ICD-10-CM

## 2011-09-24 DIAGNOSIS — IMO0001 Reserved for inherently not codable concepts without codable children: Secondary | ICD-10-CM

## 2011-09-24 MED ORDER — FENTANYL 25 MCG/HR TD PT72: 1.0000 | MEDICATED_PATCH | TRANSDERMAL | Status: DC

## 2011-09-24 NOTE — Telephone Encounter (Signed)
Rx is ready for pick up, pt aware. 

## 2011-09-24 NOTE — Telephone Encounter (Signed)
On last Fentanyl patch now.  These are staying on better than others.  But will need refill before appt.

## 2011-09-25 ENCOUNTER — Other Ambulatory Visit: Payer: Self-pay | Admitting: *Deleted

## 2011-09-25 MED ORDER — TRAMADOL HCL 50 MG PO TABS: 50.0000 mg | ORAL_TABLET | Freq: Three times a day (TID) | ORAL | Status: DC | PRN

## 2011-09-30 ENCOUNTER — Encounter: Payer: BC Managed Care – PPO | Admitting: Physical Medicine and Rehabilitation

## 2011-10-04 ENCOUNTER — Ambulatory Visit: Payer: BC Managed Care – PPO | Admitting: Physical Medicine and Rehabilitation

## 2011-10-18 ENCOUNTER — Encounter: Payer: Self-pay | Admitting: Physical Medicine and Rehabilitation

## 2011-10-18 ENCOUNTER — Encounter
Payer: BC Managed Care – PPO | Attending: Physical Medicine & Rehabilitation | Admitting: Physical Medicine and Rehabilitation

## 2011-10-18 VITALS — BP 157/96 | HR 66 | Resp 16 | Ht 69.0 in | Wt 244.8 lb

## 2011-10-18 DIAGNOSIS — M545 Low back pain, unspecified: Secondary | ICD-10-CM

## 2011-10-18 DIAGNOSIS — M47817 Spondylosis without myelopathy or radiculopathy, lumbosacral region: Secondary | ICD-10-CM | POA: Insufficient documentation

## 2011-10-18 DIAGNOSIS — M47816 Spondylosis without myelopathy or radiculopathy, lumbar region: Secondary | ICD-10-CM

## 2011-10-18 DIAGNOSIS — G8929 Other chronic pain: Secondary | ICD-10-CM | POA: Insufficient documentation

## 2011-10-18 DIAGNOSIS — IMO0001 Reserved for inherently not codable concepts without codable children: Secondary | ICD-10-CM

## 2011-10-18 DIAGNOSIS — M47814 Spondylosis without myelopathy or radiculopathy, thoracic region: Secondary | ICD-10-CM

## 2011-10-18 DIAGNOSIS — M546 Pain in thoracic spine: Secondary | ICD-10-CM

## 2011-10-18 MED ORDER — FENTANYL 25 MCG/HR TD PT72: 1.0000 | MEDICATED_PATCH | TRANSDERMAL | Status: DC

## 2011-10-18 NOTE — Progress Notes (Signed)
Subjective:    Patient ID: Tim [name], male    DOB: Jun 21, 1980, 31 y.o.   MRN: [id number]  HPI The patient complains about chronic low back and thoracic  pain . The patient denies any radiation. The patient also complains about some numbness at night when he lays in a certain position, but this resolves when he changes position.  The problem has been stable.  Pain Inventory Average Pain 8 Pain Right Now 8 My pain is constant, sharp, stabbing and aching  In the last 24 hours, has pain interfered with the following? General activity 7 Relation with others 7 Enjoyment of life 7 What TIME of day is your pain at its worst? constant Sleep (in general) Poor  Pain is worse with: walking, bending and standing Pain improves with: rest, heat/ice, medication and injections Relief from Meds: 5  Mobility walk without assistance ability to climb steps?  yes do you drive?  yes  Function employed # of hrs/week 50+  Neuro/Psych depression anxiety  Prior Studies Any changes since last visit?  no  Physicians involved in your care Any changes since last visit?  no   Family History  Problem Relation Age of Onset  . Hypertension Mother   . Hypertension Father   . Diabetes Father   . COPD Father    History   Social History  . Marital Status: Single    Spouse Name: N/A    Number of Children: N/A  . Years of Education: N/A   Social History Main Topics  . Smoking status: Current Everyday Smoker -- 1.0 packs/day for 15 years    Types: Cigarettes  . Smokeless tobacco: Current User    Types: Snuff  . Alcohol Use: None  . Drug Use: None  . Sexually Active: None   Other Topics Concern  . None   Social History Narrative  . None   Past Surgical History  Procedure Date  . Rotator cuff repair 2010    right   Past Medical History  Diagnosis Date  . Hypertension   . GAD (generalized anxiety disorder)   . PSA (psoriatic arthritis)   . Cervicalgia   . Lumbago   .  Lumbosacral spondylosis without myelopathy   . Thoracic spondylosis without myelopathy   . Myalgia and myositis, unspecified   . Other symptoms referable to back    BP 157/96  Pulse 66  Resp 16  Ht 5\' 9"  (1.753 m)  Wt 244 lb 12.8 oz (111.041 kg)  BMI 36.15 kg/m2  SpO2 100%     Review of Systems  Musculoskeletal: Positive for back pain.  Psychiatric/Behavioral: Positive for dysphoric mood. The patient is nervous/anxious.   All other systems reviewed and are negative.       Objective:   Physical Exam Symmetric normal motor tone is noted throughout. Normal muscle bulk. Muscle testing reveals 5/5 muscle strength of the upper extremity, and 5/5 of the lower extremity. Full range of motion in upper and lower extremities. ROM of spine is  restricted. Fine motor movements are normal in both hands. Sensory is intact and symmetric to light touch, pinprick and proprioception. DTR in the upper and lower extremity are present and symmetric 2+. No clonus is noted.  Patient arises from chair without difficulty. Narrow based gait with normal arm swing bilateral , able to walk on heels and toes . Tandem walk is stable. No pronator drift. Rhomberg negative.       Assessment & Plan:  1. Continued axial spine  pain which is multifactorial. There is thoracic and lumbar spondylosis and myofascial pain as well. I'm encouraged by his efforts to find sedentary, lighter duty work.  Marland Kitchen  PLAN:  1. Refill fentanyl 10mc/hr. He is to receive the over-patch to help with adhesion  2. He is still seeking the inversion table  3.Tramadol helps somewhat for breakthrough pain 50 mg 1 q.8 hours  p.r.n.  4 .Naproxen 500 mg 1 q.12 hours #60.  5. Continue weight loss efforts and stretching before, during and after work , advised patient to strengthen his core, demonstrated core exercises. 6. He will f/u with my PA next month

## 2011-10-18 NOTE — Patient Instructions (Signed)
Continue with stretches, start with walking program.

## 2011-11-04 ENCOUNTER — Other Ambulatory Visit: Payer: Self-pay

## 2011-11-04 DIAGNOSIS — IMO0001 Reserved for inherently not codable concepts without codable children: Secondary | ICD-10-CM

## 2011-11-04 DIAGNOSIS — M47816 Spondylosis without myelopathy or radiculopathy, lumbar region: Secondary | ICD-10-CM

## 2011-11-04 DIAGNOSIS — M47814 Spondylosis without myelopathy or radiculopathy, thoracic region: Secondary | ICD-10-CM

## 2011-11-04 MED ORDER — CARISOPRODOL 350 MG PO TABS: 350.0000 mg | ORAL_TABLET | Freq: Three times a day (TID) | ORAL | Status: DC | PRN

## 2011-11-11 ENCOUNTER — Other Ambulatory Visit: Payer: Self-pay | Admitting: *Deleted

## 2011-11-11 MED ORDER — NAPROXEN 500 MG PO TABS: 500.0000 mg | ORAL_TABLET | Freq: Two times a day (BID) | ORAL | Status: DC

## 2011-11-18 ENCOUNTER — Encounter: Payer: Self-pay | Admitting: Physical Medicine and Rehabilitation

## 2011-11-18 ENCOUNTER — Encounter
Payer: BC Managed Care – PPO | Attending: Physical Medicine & Rehabilitation | Admitting: Physical Medicine and Rehabilitation

## 2011-11-18 VITALS — BP 147/84 | HR 87 | Resp 16 | Ht 69.0 in | Wt 245.0 lb

## 2011-11-18 DIAGNOSIS — R209 Unspecified disturbances of skin sensation: Secondary | ICD-10-CM | POA: Insufficient documentation

## 2011-11-18 DIAGNOSIS — M546 Pain in thoracic spine: Secondary | ICD-10-CM | POA: Insufficient documentation

## 2011-11-18 DIAGNOSIS — IMO0001 Reserved for inherently not codable concepts without codable children: Secondary | ICD-10-CM

## 2011-11-18 DIAGNOSIS — I10 Essential (primary) hypertension: Secondary | ICD-10-CM | POA: Insufficient documentation

## 2011-11-18 DIAGNOSIS — G8929 Other chronic pain: Secondary | ICD-10-CM | POA: Insufficient documentation

## 2011-11-18 DIAGNOSIS — M545 Low back pain, unspecified: Secondary | ICD-10-CM | POA: Insufficient documentation

## 2011-11-18 DIAGNOSIS — M47814 Spondylosis without myelopathy or radiculopathy, thoracic region: Secondary | ICD-10-CM

## 2011-11-18 DIAGNOSIS — M47816 Spondylosis without myelopathy or radiculopathy, lumbar region: Secondary | ICD-10-CM

## 2011-11-18 DIAGNOSIS — M47817 Spondylosis without myelopathy or radiculopathy, lumbosacral region: Secondary | ICD-10-CM

## 2011-11-18 DIAGNOSIS — F17200 Nicotine dependence, unspecified, uncomplicated: Secondary | ICD-10-CM | POA: Insufficient documentation

## 2011-11-18 MED ORDER — FENTANYL 25 MCG/HR TD PT72: 1.0000 | MEDICATED_PATCH | TRANSDERMAL | Status: DC

## 2011-11-18 NOTE — Progress Notes (Signed)
Subjective:    Patient ID: Tim [name], male    DOB: [birth year], 31 y.o.   MRN: [id number]  HPI The patient complains about chronic low back and thoracic pain . The patient denies any radiation. The patient also complains about some numbness at night when he lays in a certain position, but this resolves when he changes position.  The problem has been stable.   Pain Inventory Average Pain 7 Pain Right Now 8 My pain is sharp, stabbing, tingling and aching  In the last 24 hours, has pain interfered with the following? General activity 7 Relation with others 7 Enjoyment of life 7 What TIME of day is your pain at its worst? All Day Sleep (in general) Fair  Pain is worse with: walking, bending, sitting, inactivity, standing and some activites Pain improves with: rest, heat/ice, therapy/exercise, medication, TENS and injections Relief from Meds: 7  Mobility walk without assistance ability to climb steps?  yes do you drive?  yes  Function employed # of hrs/week 40  Neuro/Psych weakness spasms depression anxiety  Prior Studies Any changes since last visit?  no  Physicians involved in your care Any changes since last visit?  no   Family History  Problem Relation Age of Onset  . Hypertension Mother   . Hypertension Father   . Diabetes Father   . COPD Father    History   Social History  . Marital Status: Single    Spouse Name: N/A    Number of Children: N/A  . Years of Education: N/A   Social History Main Topics  . Smoking status: Current Everyday Smoker -- 1.0 packs/day for 15 years    Types: Cigarettes  . Smokeless tobacco: Current User    Types: Snuff  . Alcohol Use: None  . Drug Use: None  . Sexually Active: None   Other Topics Concern  . None   Social History Narrative  . None   Past Surgical History  Procedure Date  . Rotator cuff repair 2010    right   Past Medical History  Diagnosis Date  . Hypertension   . GAD (generalized anxiety  disorder)   . PSA (psoriatic arthritis)   . Cervicalgia   . Lumbago   . Lumbosacral spondylosis without myelopathy   . Thoracic spondylosis without myelopathy   . Myalgia and myositis, unspecified   . Other symptoms referable to back    BP 147/84  Pulse 87  Resp 16  Ht 5\' 9"  (1.753 m)  Wt 245 lb (111.131 kg)  BMI 36.18 kg/m2  SpO2 100%      Review of Systems  HENT: Negative.   Eyes: Negative.   Respiratory: Negative.   Cardiovascular: Negative.   Gastrointestinal: Negative.   Genitourinary: Negative.   Musculoskeletal: Positive for back pain.  Skin: Negative.   Neurological: Positive for weakness.  Hematological: Negative.   Psychiatric/Behavioral: Negative.        Objective:   Physical Exam  Constitutional: He is oriented to person, place, and time. He appears well-developed and well-nourished.  HENT:  Head: Normocephalic.  Neck: Normal range of motion.  Musculoskeletal: He exhibits tenderness.  Neurological: He is alert and oriented to person, place, and time.  Skin: Skin is warm and dry.  Psychiatric: He has a normal mood and affect.    Symmetric normal motor tone is noted throughout. Normal muscle bulk. Muscle testing reveals 5/5 muscle strength of the upper extremity, and 5/5 of the lower extremity. Full range of motion in  upper and lower extremities. ROM of spine is restricted. Fine motor movements are normal in both hands.  Sensory is intact and symmetric to light touch, pinprick and proprioception.  DTR in the upper and lower extremity are present and symmetric 2+. No clonus is noted.  Patient arises from chair without difficulty. Narrow based gait with normal arm swing bilateral , able to walk on heels and toes . Tandem walk is stable. No pronator drift. Rhomberg negative.        Assessment & Plan:  1. Continued axial spine pain which is multifactorial. There is thoracic and lumbar spondylosis and myofascial pain as well. I'm encouraged by his  efforts to find sedentary, lighter duty work.  Marland Kitchen  PLAN:  1. Refill fentanyl 37mc/hr. He is to receive the over-patch to help with adhesion  2. Tramadol helps somewhat for breakthrough pain 50 mg 1 q.8 hours  P.r.n., he takes this very sparingly.  4 .Naproxen 500 mg 1 q.12 hours #60.  5. Continue weight loss efforts and stretching before, during and after work , advised patient to strengthen his core, demonstrated core exercises, at last visit.Patient is doing those daily, and states, that he has less symptoms during his work day.  6. He will f/u with my PA in 2 month.

## 2011-11-18 NOTE — Patient Instructions (Signed)
Continue with exercising, continue with walking

## 2011-12-12 ENCOUNTER — Telehealth: Payer: Self-pay | Admitting: Physical Medicine & Rehabilitation

## 2011-12-12 DIAGNOSIS — M47814 Spondylosis without myelopathy or radiculopathy, thoracic region: Secondary | ICD-10-CM

## 2011-12-12 DIAGNOSIS — M47816 Spondylosis without myelopathy or radiculopathy, lumbar region: Secondary | ICD-10-CM

## 2011-12-12 DIAGNOSIS — IMO0001 Reserved for inherently not codable concepts without codable children: Secondary | ICD-10-CM

## 2011-12-12 MED ORDER — FENTANYL 25 MCG/HR TD PT72: 1.0000 | MEDICATED_PATCH | TRANSDERMAL | Status: DC

## 2011-12-12 NOTE — Telephone Encounter (Signed)
Script printed ok to fill early but pt must contact manufacturer to get covers to keep patch on.  He was advised of this at his last visit as well.

## 2011-12-12 NOTE — Telephone Encounter (Signed)
Fentanyl falling off in pool and shower.  Would like to refill on Friday or Monday and write on the Rx that it is "ok to fill early".  He would appreciate.

## 2012-01-08 ENCOUNTER — Other Ambulatory Visit: Payer: Self-pay

## 2012-01-08 MED ORDER — TRAMADOL HCL 50 MG PO TABS: 50.0000 mg | ORAL_TABLET | Freq: Three times a day (TID) | ORAL | Status: DC | PRN

## 2012-01-09 ENCOUNTER — Telehealth: Payer: Self-pay

## 2012-01-09 DIAGNOSIS — M47816 Spondylosis without myelopathy or radiculopathy, lumbar region: Secondary | ICD-10-CM

## 2012-01-09 DIAGNOSIS — M47814 Spondylosis without myelopathy or radiculopathy, thoracic region: Secondary | ICD-10-CM

## 2012-01-09 DIAGNOSIS — IMO0001 Reserved for inherently not codable concepts without codable children: Secondary | ICD-10-CM

## 2012-01-09 MED ORDER — CARISOPRODOL 350 MG PO TABS: 350.0000 mg | ORAL_TABLET | Freq: Three times a day (TID) | ORAL | Status: DC | PRN

## 2012-01-09 MED ORDER — FENTANYL 25 MCG/HR TD PT72: 1.0000 | MEDICATED_PATCH | TRANSDERMAL | Status: AC

## 2012-01-09 NOTE — Telephone Encounter (Signed)
Patient called requesting refill on his patches and soma.  Also wanted to let us know that his shoulder is bothering him more.  He thinks he has a torn rotator cuff.  He has a follow up appt.

## 2012-01-09 NOTE — Telephone Encounter (Signed)
Soma called in to pharmacy.  Patient aware.

## 2012-01-10 ENCOUNTER — Telehealth: Payer: Self-pay | Admitting: Physical Medicine & Rehabilitation

## 2012-01-10 NOTE — Telephone Encounter (Signed)
On refill on Fentanyl patches, please put on there it is ok to fill 2 days early.

## 2012-01-10 NOTE — Telephone Encounter (Signed)
Rx is ready for pt to pick up.

## 2012-01-13 ENCOUNTER — Telehealth: Payer: Self-pay

## 2012-01-13 NOTE — Telephone Encounter (Signed)
Please call regarding medications

## 2012-01-13 NOTE — Telephone Encounter (Signed)
Medication approved to be filled.  He is aware this is not to be filled early again.

## 2012-01-17 ENCOUNTER — Encounter: Payer: Self-pay | Admitting: Physical Medicine and Rehabilitation

## 2012-01-17 ENCOUNTER — Encounter
Payer: BC Managed Care – PPO | Attending: Physical Medicine and Rehabilitation | Admitting: Physical Medicine and Rehabilitation

## 2012-01-17 VITALS — BP 139/91 | HR 87 | Resp 14 | Ht 69.0 in | Wt 218.0 lb

## 2012-01-17 DIAGNOSIS — M25519 Pain in unspecified shoulder: Secondary | ICD-10-CM

## 2012-01-17 DIAGNOSIS — M47817 Spondylosis without myelopathy or radiculopathy, lumbosacral region: Secondary | ICD-10-CM

## 2012-01-17 DIAGNOSIS — M719 Bursopathy, unspecified: Secondary | ICD-10-CM | POA: Insufficient documentation

## 2012-01-17 DIAGNOSIS — M67919 Unspecified disorder of synovium and tendon, unspecified shoulder: Secondary | ICD-10-CM

## 2012-01-17 DIAGNOSIS — M25511 Pain in right shoulder: Secondary | ICD-10-CM

## 2012-01-17 DIAGNOSIS — M47816 Spondylosis without myelopathy or radiculopathy, lumbar region: Secondary | ICD-10-CM

## 2012-01-17 DIAGNOSIS — M47814 Spondylosis without myelopathy or radiculopathy, thoracic region: Secondary | ICD-10-CM | POA: Insufficient documentation

## 2012-01-17 DIAGNOSIS — G8929 Other chronic pain: Secondary | ICD-10-CM | POA: Insufficient documentation

## 2012-01-17 DIAGNOSIS — M7581 Other shoulder lesions, right shoulder: Secondary | ICD-10-CM

## 2012-01-17 DIAGNOSIS — M546 Pain in thoracic spine: Secondary | ICD-10-CM | POA: Insufficient documentation

## 2012-01-17 MED ORDER — DICLOFENAC SODIUM 1 % TD GEL: 2.0000 g | Freq: Four times a day (QID) | TRANSDERMAL | Status: DC

## 2012-01-17 NOTE — Patient Instructions (Signed)
Continue to apply ice or heat to your right shoulder, continue with walking program.

## 2012-01-17 NOTE — Progress Notes (Signed)
Subjective:    Patient ID: Tim [name], male    DOB: [birth year], 31 y.o.   MRN: [id number]  HPI The patient complains about chronic low back and thoracic pain . The patient denies any radiation. The patient also complains about some numbness at night when he lays in a certain position, but this resolves when he changes position.  The problem has been stable. He reports, that he has injured/overused his right shoulder while doing yard work 10 days ago. He has pain at the anterior part of his right shoulder. Lifting and moving the right shoulder makes it worse, he does not have pain resting, and there is no loss of strength.  Pain Inventory Average Pain 8 Pain Right Now 8 My pain is sharp, burning, stabbing and aching  In the last 24 hours, has pain interfered with the following? General activity 7 Relation with others 7 Enjoyment of life 7 What TIME of day is your pain at its worst? all the time Sleep (in general) Fair  Pain is worse with: walking, bending, sitting, standing and some activites Pain improves with: rest, heat/ice, medication and injections Relief from Meds: 6  Mobility walk without assistance how many minutes can you walk? walks around the block ability to climb steps?  yes do you drive?  yes Do you have any goals in this area?  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  yes  Neuro/Psych weakness spasms depression anxiety  Prior Studies Any changes since last visit?  no  Physicians involved in your care Any changes since last visit?  no   Family History  Problem Relation Age of Onset  . Hypertension Mother   . Hypertension Father   . Diabetes Father   . COPD Father    History   Social History  . Marital Status: Single    Spouse Name: N/A    Number of Children: N/A  . Years of Education: N/A   Social History Main Topics  . Smoking status: Current Every Day Smoker -- 1.0 packs/day for 15 years   Types: Cigarettes  . Smokeless tobacco: Current User    Types: Snuff  . Alcohol Use: None  . Drug Use: None  . Sexually Active: None   Other Topics Concern  . None   Social History Narrative  . None   Past Surgical History  Procedure Date  . Rotator cuff repair 2010    right   Past Medical History  Diagnosis Date  . Hypertension   . GAD (generalized anxiety disorder)   . PSA (psoriatic arthritis)   . Cervicalgia   . Lumbago   . Lumbosacral spondylosis without myelopathy   . Thoracic spondylosis without myelopathy   . Myalgia and myositis, unspecified   . Other symptoms referable to back    BP 139/91  Pulse 87  Resp 14  Ht 5\' 9"  (1.753 m)  Wt 218 lb (98.884 kg)  BMI 32.19 kg/m2  SpO2 100%     Review of Systems  Musculoskeletal: Positive for myalgias, back pain and arthralgias.  Neurological: Positive for weakness.  Psychiatric/Behavioral: Positive for dysphoric mood. The patient is nervous/anxious.   All other systems reviewed and are negative.       Objective:   Physical Exam Constitutional: He is oriented to person, place, and time. He appears well-developed and well-nourished.  HENT:  Head: Normocephalic.  Neck: Normal range of motion.  Musculoskeletal: He exhibits tenderness.  Neurological:  He is alert and oriented to person, place, and time.  Skin: Skin is warm and dry.  Psychiatric: He has a normal mood and affect.   Symmetric normal motor tone is noted throughout. Normal muscle bulk. Muscle testing reveals 5/5 muscle strength of the upper extremity, and 5/5 of the lower extremity. Full range of motion in upper and lower extremities.Except pain with full ROM of right shoulder, Abduction moderate painful against resistance, Empty can test , moderate pain , but full strength.  ROM of spine is restricted. Fine motor movements are normal in both hands.  Sensory is intact and symmetric to light touch, pinprick and proprioception.  DTR in the upper and  lower extremity are present and symmetric 2+. No clonus is noted.  Patient arises from chair without difficulty. Narrow based gait with normal arm swing bilateral , able to walk on heels and toes . Tandem walk is stable. No pronator drift. Rhomberg negative.        Assessment & Plan:  1. Continued axial spine pain which is multifactorial. There is thoracic and lumbar spondylosis and myofascial pain as well. I'm encouraged by his efforts to find sedentary, lighter duty work.   2. Tendinitis of rotator cuff on the right, patient should apply ice or heat, prescribed Voltaren gel, if his insurance does not pay for this,he could apply aspacreme. Patient is taking naproxen 500mg  bid. If his shoulder does not get better in the next 4 weeks, consider further imaging. Marland Kitchen  PLAN:  1. Refill fentanyl 57mc/hr, when due. He is to receive the over-patch to help with adhesion  2. Tramadol helps somewhat for breakthrough pain 50 mg 1 q.8 hours  P.r.n., he takes this very sparingly.  4 . Continue weight loss efforts and stretching before, during and after work , advised patient to strengthen his core, demonstrated core exercises, at last visit.Patient is doing those daily, and states, that he has less symptoms during his work day.  6. He will f/u with my PA in 2 month.

## 2012-02-10 ENCOUNTER — Telehealth: Payer: Self-pay | Admitting: *Deleted

## 2012-02-10 MED ORDER — FENTANYL 25 MCG/HR TD PT72: 1.0000 | MEDICATED_PATCH | TRANSDERMAL | Status: DC

## 2012-02-10 NOTE — Telephone Encounter (Signed)
Fentanyl refill  Printed for Riley Kill to sign

## 2012-02-10 NOTE — Telephone Encounter (Signed)
Pt aware that rx is ready to be picked up. 

## 2012-02-17 ENCOUNTER — Other Ambulatory Visit: Payer: Self-pay | Admitting: Physical Medicine & Rehabilitation

## 2012-03-06 ENCOUNTER — Telehealth: Payer: Self-pay

## 2012-03-06 NOTE — Telephone Encounter (Signed)
Patient needs a new prescription for Fentanyl Patch .

## 2012-03-06 NOTE — Telephone Encounter (Signed)
Last filled 02/10/12 request is early and this is a continuing issue will fill next week.

## 2012-03-09 ENCOUNTER — Telehealth: Payer: Self-pay | Admitting: Physical Medicine & Rehabilitation

## 2012-03-09 MED ORDER — FENTANYL 25 MCG/HR TD PT72: 1.0000 | MEDICATED_PATCH | TRANSDERMAL | Status: DC

## 2012-03-09 NOTE — Telephone Encounter (Signed)
Patch script printed waiting to be signed.

## 2012-03-09 NOTE — Telephone Encounter (Signed)
Refill on Fentanyl patch.  P/u today?

## 2012-03-09 NOTE — Telephone Encounter (Signed)
Patch script printed waiting to be signed. 

## 2012-03-10 NOTE — Telephone Encounter (Signed)
Notified that rx is available for pick up.

## 2012-03-18 ENCOUNTER — Encounter: Payer: Self-pay | Admitting: Physical Medicine and Rehabilitation

## 2012-03-18 ENCOUNTER — Encounter
Payer: BC Managed Care – PPO | Attending: Physical Medicine and Rehabilitation | Admitting: Physical Medicine and Rehabilitation

## 2012-03-18 VITALS — BP 151/97 | HR 81 | Resp 14 | Ht 69.0 in | Wt 252.0 lb

## 2012-03-18 DIAGNOSIS — F17200 Nicotine dependence, unspecified, uncomplicated: Secondary | ICD-10-CM | POA: Insufficient documentation

## 2012-03-18 DIAGNOSIS — M67919 Unspecified disorder of synovium and tendon, unspecified shoulder: Secondary | ICD-10-CM | POA: Insufficient documentation

## 2012-03-18 DIAGNOSIS — I10 Essential (primary) hypertension: Secondary | ICD-10-CM | POA: Insufficient documentation

## 2012-03-18 DIAGNOSIS — M719 Bursopathy, unspecified: Secondary | ICD-10-CM | POA: Insufficient documentation

## 2012-03-18 DIAGNOSIS — M47817 Spondylosis without myelopathy or radiculopathy, lumbosacral region: Secondary | ICD-10-CM | POA: Insufficient documentation

## 2012-03-18 DIAGNOSIS — M545 Low back pain, unspecified: Secondary | ICD-10-CM | POA: Insufficient documentation

## 2012-03-18 DIAGNOSIS — M47816 Spondylosis without myelopathy or radiculopathy, lumbar region: Secondary | ICD-10-CM

## 2012-03-18 DIAGNOSIS — M546 Pain in thoracic spine: Secondary | ICD-10-CM | POA: Insufficient documentation

## 2012-03-18 DIAGNOSIS — G8929 Other chronic pain: Secondary | ICD-10-CM | POA: Insufficient documentation

## 2012-03-18 DIAGNOSIS — M47814 Spondylosis without myelopathy or radiculopathy, thoracic region: Secondary | ICD-10-CM

## 2012-03-18 MED ORDER — FENTANYL 25 MCG/HR TD PT72: 1.0000 | MEDICATED_PATCH | TRANSDERMAL | Status: DC

## 2012-03-18 NOTE — Progress Notes (Signed)
Subjective:    Patient ID: Tim [name], male    DOB: [birth year], 31 y.o.   MRN: [id number]  HPI The patient complains about chronic low back and thoracic pain . The patient denies any radiation. The patient also complains about some numbness at night when he lays in a certain position, but this resolves when he changes position.  The problem has been stable. He reports, that he has overused his right shoulder again, while decorating his yard/ house 4 days ago. He has intermittend pain at the anterior part of his right shoulder. Lifting and moving the right shoulder makes it worse, he does not have pain resting, and there is no loss of strength.  Pain Inventory Average Pain 7 Pain Right Now 7 My pain is sharp, stabbing and aching  In the last 24 hours, has pain interfered with the following? General activity 6 Relation with others 6 Enjoyment of life 6 What TIME of day is your pain at its worst? all of the time Sleep (in general) Poor  Pain is worse with: walking, bending, sitting, standing and some activites Pain improves with: heat/ice, medication, TENS and injections Relief from Meds: 6  Mobility walk without assistance ability to climb steps?  yes do you drive?  yes  Function employed # of hrs/week 50+  Neuro/Psych weakness spasms depression anxiety  Prior Studies Any changes since last visit?  no  Physicians involved in your care Any changes since last visit?  no   Family History  Problem Relation Age of Onset  . Hypertension Mother   . Hypertension Father   . Diabetes Father   . COPD Father    History   Social History  . Marital Status: Single    Spouse Name: N/A    Number of Children: N/A  . Years of Education: N/A   Social History Main Topics  . Smoking status: Current Every Day Smoker -- 1.0 packs/day for 15 years    Types: Cigarettes  . Smokeless tobacco: Current User    Types: Snuff  . Alcohol Use: None  . Drug Use: None  . Sexually  Active: None   Other Topics Concern  . None   Social History Narrative  . None   Past Surgical History  Procedure Date  . Rotator cuff repair 2010    right   Past Medical History  Diagnosis Date  . Hypertension   . GAD (generalized anxiety disorder)   . PSA (psoriatic arthritis)   . Cervicalgia   . Lumbago   . Lumbosacral spondylosis without myelopathy   . Thoracic spondylosis without myelopathy   . Myalgia and myositis, unspecified   . Other symptoms referable to back    BP 151/97  Pulse 81  Ht 5\' 9"  (1.753 m)  Wt 252 lb (114.306 kg)  BMI 37.21 kg/m2  SpO2 100%    Review of Systems  Musculoskeletal:       Spasms  Neurological: Positive for weakness.  Psychiatric/Behavioral: Positive for dysphoric mood. The patient is nervous/anxious.   All other systems reviewed and are negative.       Objective:   Physical Exam Constitutional: He is oriented to person, place, and time. He appears well-developed and well-nourished.  HENT:  Head: Normocephalic.  Neck: Normal range of motion.  Musculoskeletal: He exhibits tenderness.  Neurological: He is alert and oriented to person, place, and time.  Skin: Skin is warm and dry.  Psychiatric: He has a normal mood and affect.  Symmetric normal motor  tone is noted throughout. Normal muscle bulk. Muscle testing reveals 5/5 muscle strength of the upper extremity, and 5/5 of the lower extremity. Full range of motion in upper and lower extremities.Except pain with full ROM of right shoulder, Abduction mild- moderate painful against resistance, Empty can test , mild- moderate pain , but full strength.  ROM of spine is restricted. Fine motor movements are normal in both hands.  Sensory is intact and symmetric to light touch, pinprick and proprioception.  DTR in the upper and lower extremity are present and symmetric 2+. No clonus is noted.  Patient arises from chair without difficulty. Narrow based gait with normal arm swing bilateral  , able to walk on heels and toes . Tandem walk is stable. No pronator drift. Rhomberg negative.        Assessment & Plan:  1. Continued axial spine pain which is multifactorial. There is thoracic and lumbar spondylosis and myofascial pain as well. I'm encouraged by his efforts to find sedentary, lighter duty work.  2. Tendinitis of rotator cuff on the right, patient should apply ice or heat, prescribed Voltaren gel, if his insurance does not pay for this,he could apply Arnica cream. Patient is taking naproxen 500mg  bid. If his shoulder does not get better in the next 4 weeks, consider further imaging, patient does not want to get X-ray at this point, his shoulder only hurts intermittendly..  .  PLAN:  1. Refill fentanyl 84mc/hr, when due. He is to receive the over-patch to help with adhesion  2. Tramadol helps somewhat for breakthrough pain 50 mg 1 q.8 hours  P.r.n., he takes this very sparingly.  4 . Continue weight loss efforts and stretching before, during and after work , advised patient to strengthen his core, demonstrated core exercises, at last visit.Patient is doing those daily, and states, that he has less symptoms during his work day.  6. He will f/u with my PA in 2 month.

## 2012-03-18 NOTE — Patient Instructions (Addendum)
Stay as active as tolerated, continue with your exercises. You could try Arnica cream for your muscle/shoulder joint cream.

## 2012-03-24 ENCOUNTER — Telehealth: Payer: Self-pay

## 2012-03-24 ENCOUNTER — Other Ambulatory Visit: Payer: Self-pay | Admitting: Physical Medicine & Rehabilitation

## 2012-03-24 DIAGNOSIS — M47814 Spondylosis without myelopathy or radiculopathy, thoracic region: Secondary | ICD-10-CM

## 2012-03-24 DIAGNOSIS — M47816 Spondylosis without myelopathy or radiculopathy, lumbar region: Secondary | ICD-10-CM

## 2012-03-24 DIAGNOSIS — IMO0001 Reserved for inherently not codable concepts without codable children: Secondary | ICD-10-CM

## 2012-03-24 NOTE — Telephone Encounter (Signed)
Patient is requesting his soma refill.  He has had if filled 9/13, 10/4, 10/21, and 11/7.  He is out early again.  This has been a constant battle with this patient. Can we give him a longer supply or just stop supplying?  Please advise whether or not to refill soma.

## 2012-03-25 ENCOUNTER — Telehealth: Payer: Self-pay

## 2012-03-25 DIAGNOSIS — IMO0001 Reserved for inherently not codable concepts without codable children: Secondary | ICD-10-CM

## 2012-03-25 DIAGNOSIS — M47816 Spondylosis without myelopathy or radiculopathy, lumbar region: Secondary | ICD-10-CM

## 2012-03-25 DIAGNOSIS — M47814 Spondylosis without myelopathy or radiculopathy, thoracic region: Secondary | ICD-10-CM

## 2012-03-25 MED ORDER — CARISOPRODOL 350 MG PO TABS: 350.0000 mg | ORAL_TABLET | Freq: Three times a day (TID) | ORAL | Status: DC | PRN

## 2012-03-25 NOTE — Telephone Encounter (Signed)
i filled #90 which i will expect to last at least a month

## 2012-03-25 NOTE — Telephone Encounter (Signed)
Soma called into [facility].  Patient aware it must last a month.

## 2012-04-14 ENCOUNTER — Ambulatory Visit (INDEPENDENT_AMBULATORY_CARE_PROVIDER_SITE_OTHER): Payer: Worker's Compensation | Admitting: Psychology

## 2012-04-14 ENCOUNTER — Telehealth (HOSPITAL_COMMUNITY): Payer: Self-pay

## 2012-04-14 ENCOUNTER — Encounter (HOSPITAL_COMMUNITY): Payer: Self-pay | Admitting: Psychology

## 2012-04-14 DIAGNOSIS — F1010 Alcohol abuse, uncomplicated: Secondary | ICD-10-CM

## 2012-04-14 DIAGNOSIS — F331 Major depressive disorder, recurrent, moderate: Secondary | ICD-10-CM | POA: Insufficient documentation

## 2012-04-14 DIAGNOSIS — F332 Major depressive disorder, recurrent severe without psychotic features: Secondary | ICD-10-CM
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
## 2012-04-14 NOTE — Telephone Encounter (Signed)
04/14/12 9:49AM PT HAS NO MONEY FOR CO-PAY./SH

## 2012-05-05 ENCOUNTER — Ambulatory Visit (HOSPITAL_COMMUNITY): Payer: Self-pay | Admitting: Psychology

## 2012-05-07 ENCOUNTER — Telehealth: Payer: Self-pay

## 2012-05-07 NOTE — Telephone Encounter (Signed)
Patient called to follow up on fentanyl request.

## 2012-05-07 NOTE — Telephone Encounter (Signed)
Patient called requesting fentanyl patch refill.  Ok per patient to leave detailed message.

## 2012-05-07 NOTE — Telephone Encounter (Signed)
Please see behavioral health note from December. Pt admits to abusing alcohol while taking narcs from [place]. (it sounds as if this took place up until late November).  i don't believe that we can continue to safely prescribe narcotics for this patient. He has also violated his CSA. i will give him a 12.5 fentanyl patch box, and then he will be dc'ed from this clinic

## 2012-05-07 NOTE — Telephone Encounter (Signed)
See staff message about this refill.

## 2012-05-08 MED ORDER — FENTANYL 12 MCG/HR TD PT72: 1.0000 | MEDICATED_PATCH | TRANSDERMAL | Status: DC

## 2012-05-08 NOTE — Telephone Encounter (Addendum)
I spoke with Tim [name] and informed him that Dr Riley Kill has decided that with the circumstances surrounding his [facility] admission and admitting that he has been abusing alcohol while under his care, that he will no longer prescribe controlled substances for him and that he will be discharged from our clinic. Tim [name] says that it has been a long time since he abused alcohol. I told him I understood but that Dr Riley Kill  is no longer willing to prescribe narcotics for him. We will give him a reduced dose of Fentanyl #5 and a list of other clinics in the area. A formal letter will be mailed. He said he felt we were just hanging him out to dry. I said I understand his feelings but that our decision is firm.  He will pick up the prescription today.

## 2012-05-08 NOTE — Telephone Encounter (Signed)
Left VM for Mr Schoenfeld to return my call.

## 2012-05-11 ENCOUNTER — Encounter: Payer: Self-pay | Admitting: Physical Medicine & Rehabilitation

## 2012-05-12 ENCOUNTER — Telehealth (HOSPITAL_COMMUNITY): Payer: Self-pay | Admitting: Psychology

## 2012-05-12 NOTE — Telephone Encounter (Signed)
Pt called and left message asking for counselor to call back due to personal problem.  Pt reported that his pain management clinic has terminated him as a pt as they reported to him that he was coming to [facility] with dx of Alcohol Abuse.  Counselor reviewed pt chart and informed that no medical records have been disclosed to this provider and reported that dx for his encounter on 04/14/12 was MDD.  Counselor also discussed how [facility] notes were considered confidential and not viewable by outside providers.  I recommended he f/u with pain management provider to seek a review of their decision and where viewed reported dx.  I also reported I would inform [facility] Office Manager of concern of breech of confidentiality.  Pt asked for return call if anything discovered.

## 2012-05-14 ENCOUNTER — Telehealth: Payer: Self-pay | Admitting: *Deleted

## 2012-05-14 NOTE — Telephone Encounter (Signed)
Would like to know more information about why he was discharged. Would like Dr Riley Kill to call him.

## 2012-05-15 NOTE — Telephone Encounter (Signed)
Pt called. No answer.Message left

## 2012-05-15 NOTE — Telephone Encounter (Signed)
Would like a call to discuss discharge.

## 2012-05-18 ENCOUNTER — Ambulatory Visit: Payer: Self-pay | Admitting: Physical Medicine and Rehabilitation

## 2012-05-18 ENCOUNTER — Telehealth: Payer: Self-pay | Admitting: *Deleted

## 2012-05-18 NOTE — Telephone Encounter (Signed)
Dr Riley Kill please call a little after 3 pm.  He was working when he called and left message and could not answer. He gets off at 3.

## 2012-05-19 NOTE — Telephone Encounter (Signed)
I spoke to Mr. Messner regarding the reason for his discharge

## 2012-05-21 ENCOUNTER — Ambulatory Visit (INDEPENDENT_AMBULATORY_CARE_PROVIDER_SITE_OTHER): Payer: BC Managed Care – PPO | Admitting: Physician Assistant

## 2012-05-21 DIAGNOSIS — F1910 Other psychoactive substance abuse, uncomplicated: Secondary | ICD-10-CM

## 2012-05-21 DIAGNOSIS — F331 Major depressive disorder, recurrent, moderate: Secondary | ICD-10-CM

## 2012-05-21 DIAGNOSIS — F39 Unspecified mood [affective] disorder: Secondary | ICD-10-CM

## 2012-05-21 DIAGNOSIS — F411 Generalized anxiety disorder: Secondary | ICD-10-CM

## 2012-05-21 MED ORDER — TRAZODONE HCL 50 MG PO TABS: 50.0000 mg | ORAL_TABLET | Freq: Every day | ORAL | Status: DC

## 2012-06-05 ENCOUNTER — Ambulatory Visit (INDEPENDENT_AMBULATORY_CARE_PROVIDER_SITE_OTHER): Payer: BC Managed Care – PPO | Admitting: Psychology

## 2012-06-05 DIAGNOSIS — F331 Major depressive disorder, recurrent, moderate: Secondary | ICD-10-CM

## 2012-06-05 NOTE — Progress Notes (Signed)
   THERAPIST PROGRESS NOTE  Session Time: 3.34pm-4:15pm  Participation Level: Active  Behavioral Response: Well GroomedAlert, Affect WNL  Type of Therapy: Individual Therapy  Treatment Goals addressed: Diagnosis: MDD and goal 1.  Interventions: CBT and Strength-based  Summary: Oluwanifemi Susman is a 32 y.o. male who presents with generally full and bright affect.  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  Pt reported initially he was working 7 days a week, pt reports just recently shifted to 10 hours days Thurs- Sunday.  Pt reported working and having an income has been beneficial for him and self worth.  Pt reported less depressed and no SI.  Pt also reports no use of alcohol since last seen.  Pt did report that has started Trazodone as prescribed by Jorje Guild and feels beneficial as helping him sleep more restful through the night- only waking one time a night now.  Pt reported no longer taking any pain meds since discharged from last provider due to provider viewing confidential information re: his alcohol use reported on intake. Pt did report that he did experience withdrawal from pain meds and hesitant to seek new pain management as feels will be judged.  Pt still feels that this was violation of his privacy and was interested in making a complaint.    Suicidal/Homicidal: Nowithout intent/plan  Therapist Response: Assessed pt current functioning per pt report.  Explored w/pt mood and contributing factors.  Discussed increased positive self worth feeling and healthy decisions for self.  Reflected positives.  Plan: Return again in 3 weeks.  Pt reported needing a 3:30 due to work schedule.    Diagnosis: Axis I: MDD    Axis II: No diagnosis    Tashay Bozich, LPC 06/05/2012

## 2012-07-07 ENCOUNTER — Ambulatory Visit (HOSPITAL_COMMUNITY): Payer: Self-pay | Admitting: Psychology

## 2012-07-28 ENCOUNTER — Encounter (HOSPITAL_COMMUNITY): Payer: Self-pay | Admitting: Physician Assistant

## 2012-07-28 NOTE — Progress Notes (Signed)
[name] Assessment Adult  Patient Identification:  Tim [name] Date of Evaluation:  07/28/2012 Chief Complaint: Patient referred by [name], [name] for treatment of depression and anxiety. History of Chief Complaint:   Chief Complaint  Patient presents with  . Depression  . Anxiety  . Establish Care    HPI Tim [name] has previously been a patient of Dr[facility] R[facility]an [name] at [name], but reports he cannot afford that facility. His last appointment there was 2 months ago. He was prescribed Cymbalta and lithium, but he stopped taking them because he did not like the way they made him feel.  My currently denies any depression, and denies that he has any suicidal or homicidal ideation. He endorses poor sleep, but reports that that is do to his pain. He will sleep for one to 2 hours, then be awake for 2 hours due to pain then go back to sleep after 10-20 minutes, and continue to wake 2-3 more times per night. He does endorse a significant amount of anxiety in that he worries excessively. He has episodes of increased anxiety once or twice per month. He denies any symptoms of OCD or PTSD. He denies any period of time with decreased need for sleep or elevated mood and energy. He denies experiencing auditory or visual hallucinations.  Tim [name] reports that he has a past history of substance abuse, and was at the [name] for residential treatment. He reports that was his last use of drugs, but reports that he drank 1/2 pint of liquor 3 months ago. He has significant pain issues, and was a patient of [name] for one year, but he was terminated from that practice last week secondary to alcohol abuse. [name] was prescribing fentanyl patches.  Review of Systems Physical Exam  Depressive Symptoms: anxiety, insomnia,  (Hypo) Manic Symptoms:   Elevated Mood:  No Irritable Mood:  Yes Grandiosity:  No Distractibility:  No Labiality of Mood:  No Delusions:   No Hallucinations:  No Impulsivity:  No Sexually Inappropriate Behavior:  No Financial Extravagance:  No Flight of Ideas:  No  Anxiety Symptoms: Excessive Worry:  Yes Panic Symptoms:  No Agoraphobia:  No Obsessive Compulsive: No  Symptoms: None, Specific Phobias:  No Social Anxiety:  No  Psychotic Symptoms:  Hallucinations: No None Delusions:  No Paranoia:  No   Ideas of Reference:  No  PTSD Symptoms: Ever had a traumatic exposure:  No  Traumatic Brain Injury: No   Past [name] History: Diagnosis: Depression and anxiety   Hospitalizations: For drug rehabilitation only   Outpatient Care: Previously with Dr. Roxan [name]   Substance Abuse Care: [name] 2007   Self-Mutilation: No   Suicidal Attempts: No   Violent Behaviors: Denies    Past Medical History:   Past Medical History  Diagnosis Date  . Hypertension   . GAD (generalized anxiety disorder)   . PSA (psoriatic arthritis)   . Cervicalgia   . Lumbago   . Lumbosacral spondylosis without myelopathy   . Thoracic spondylosis without myelopathy   . Myalgia and myositis, unspecified   . Other symptoms referable to back    History of Loss of Consciousness:  No Seizure History:  No Cardiac History:  No Allergies:   Allergies  Allergen Reactions  . Ceclor (Cefaclor)   . Sulfa Antibiotics    Current Medications:  Current Outpatient Prescriptions  Medication Sig Dispense Refill  . amLODipine (NORVASC) 10 MG tablet Take 10 mg by mouth daily.        Marland Kitchen  carisoprodol (SOMA) 350 MG tablet Take 1 tablet (350 mg total) by mouth every 8 (eight) hours as needed.  90 tablet  3  . diclofenac sodium (VOLTAREN) 1 % GEL Apply 2 g topically 4 (four) times daily.  2 Tube  2  . fenofibrate (TRICOR) 145 MG tablet Take 1 tablet by mouth Daily.      . fentaNYL (DURAGESIC - DOSED MCG/HR) 12 MCG/HR Place 1 patch (12.5 mcg total) onto the skin every 3 (three) days.  5 patch  0  . gabapentin (NEURONTIN) 100 MG capsule 1 in  am 1 in evening and 3 @@ hs      . lithium carbonate 300 MG capsule Take 1 capsule by mouth Twice daily.      Marland Kitchen loratadine (CLARITIN) 10 MG tablet Take 10 mg by mouth daily.      . nabumetone (RELAFEN) 500 MG tablet Take 500 mg by mouth 2 (two) times daily.      . naproxen (NAPROSYN) 500 MG tablet TAKE 1 TABLET BY MOUTH EVERY 12 HOURS  60 tablet  2  . traMADol (ULTRAM) 50 MG tablet Take 1 tablet (50 mg total) by mouth every 8 (eight) hours as needed.  90 tablet  3  . traZODone (DESYREL) 50 MG tablet Take 1 tablet (50 mg total) by mouth at bedtime.  30 tablet  1   No current facility-administered medications for this visit.    Previous Psychotropic Medications:  Medication Dose   Cymbalta    Lithium     Neurontin                Substance Abuse History in the last 12 months:  Patient reports drinking a half pint of liquor 3 months ago, but was terminated from [name] secondary to alcohol abuse one week ago.   Social History: Tim [name] was born and grew up as needed, [place]. He reports he had a good childhood. He has one sister who is 3 years younger than he. His parents are still married. He graduated from high school and attended [employer] studying information systems for 2 years but did not turn a degree. In high school he was a member of the [name]. 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 He has never been married he has one son who is currently 39 years old. He shares custody with his son's mother, but the patient keeps his son most of the time. He denies any current legal issues. He reports that his social support system consists of his mother, father, and sister. He currently lives with his family. He is currently working for [name] through a [name]. He reports that he believes in God but he does not practice any religion.  Family History:   Family History  Problem Relation Age of Onset  .  Hypertension Mother   . Hypertension Father   . Diabetes Father   . COPD Father   . Cancer Paternal Uncle   . Cancer Maternal Grandmother   . Cancer Paternal Grandmother   . ADD / ADHD Son     Mental Status Examination/Evaluation: Objective:  Appearance: Casual  Eye Contact::  Good  Speech:  Clear and Coherent  Volume:  Normal  Mood:  Dysphoric and mildly anxious   Affect:  Congruent  Thought Process:  Linear  Orientation:  Full (Time, Place, and Person)  Thought Content:  WDL  Suicidal Thoughts:  No  Homicidal Thoughts:  No  Judgement:  Impaired  Insight:  Lacking  Psychomotor Activity:  Normal  Akathisia:  No  Handed:    AIMS (if indicated):    Assets:  Desire for Improvement Social Support    Laboratory/X-Ray Psychological Evaluation(s)        Assessment:    AXIS I Generalized Anxiety Disorder, Mood Disorder NOS and Substance Abuse  AXIS II Deferred  AXIS III Past Medical History  Diagnosis Date  . Hypertension   . GAD (generalized anxiety disorder)   . PSA (psoriatic arthritis)   . Cervicalgia   . Lumbago   . Lumbosacral spondylosis without myelopathy   . Thoracic spondylosis without myelopathy   . Myalgia and myositis, unspecified   . Other symptoms referable to back      AXIS IV   AXIS V 51-60 moderate symptoms   Treatment Plan/Recommendations:  Plan of Care: We will prescribe trazodone to try to improve his sleep, and turned in 2 months for followup. We'll continue to monitor his mood and prescribe appropriately.   Laboratory:    Psychotherapy: Continue with [name]   Medications: Trazodone 50 mg at bedtime   Routine PRN Medications:  No  Consultations:   Safety Concerns:    Other:      Jamell Laymon, PA-C 4/1/20149:58 AM

## 2012-07-28 NOTE — Progress Notes (Deleted)
Psychiatric Assessment Adult  Patient Identification:  Tim [name] Date of Evaluation:  07/28/2012 Chief Complaint: Patient referred by [name] History of Chief Complaint:  No chief complaint on file.   HPI Review of Systems Physical Exam  Depressive Symptoms: {DEPRESSION SYMPTOMS:20000}  (Hypo) Manic Symptoms:   Elevated Mood:  {BHH YES OR NO:22294} Irritable Mood:  {BHH YES OR NO:22294} Grandiosity:  {BHH YES OR NO:22294} Distractibility:  {BHH YES OR NO:22294} Labiality of Mood:  {BHH YES OR NO:22294} Delusions:  {BHH YES OR NO:22294} Hallucinations:  {BHH YES OR NO:22294} Impulsivity:  {BHH YES OR NO:22294} Sexually Inappropriate Behavior:  {BHH YES OR NO:22294} Financial Extravagance:  {BHH YES OR NO:22294} Flight of Ideas:  {BHH YES OR NO:22294}  Anxiety Symptoms: Excessive Worry:  {BHH YES OR NO:22294} Panic Symptoms:  {BHH YES OR NO:22294} Agoraphobia:  {BHH YES OR NO:22294} Obsessive Compulsive: {BHH YES OR NO:22294}  Symptoms: {Obsessive Compulsive Symptoms:22671} Specific Phobias:  {BHH YES OR NO:22294} Social Anxiety:  {BHH YES OR NO:22294}  Psychotic Symptoms:  Hallucinations: {BHH YES OR NO:22294} {Hallucinations:22672} Delusions:  {BHH YES OR NO:22294} Paranoia:  {BHH YES OR NO:22294}   Ideas of Reference:  {BHH YES OR NO:22294}  PTSD Symptoms: Ever had a traumatic exposure:  {BHH YES OR NO:22294} Had a traumatic exposure in the last month:  {BHH YES OR NO:22294} Re-experiencing: {BHH YES OR NO:22294} {Re-experiencing:22673} Hypervigilance:  {BHH YES OR NO:22294} Hyperarousal: {BHH YES OR NO:22294} {Hyperarousal:22674} Avoidance: {BHH YES OR NO:22294} {Avoidance:22675}  Traumatic Brain Injury: {BHH YES OR NO:22294} {Traumatic Brain Injury:22676}  Past Psychiatric History: Diagnosis: ***  Hospitalizations: ***  Outpatient Care: ***  Substance Abuse Care: ***  Self-Mutilation: ***  Suicidal Attempts: ***  Violent Behaviors: ***   Past  Medical History:   Past Medical History  Diagnosis Date  . Hypertension   . GAD (generalized anxiety disorder)   . PSA (psoriatic arthritis)   . Cervicalgia   . Lumbago   . Lumbosacral spondylosis without myelopathy   . Thoracic spondylosis without myelopathy   . Myalgia and myositis, unspecified   . Other symptoms referable to back    History of Loss of Consciousness:  {BHH YES OR NO:22294} Seizure History:  {BHH YES OR NO:22294} Cardiac History:  {BHH YES OR NO:22294} Allergies:   Allergies  Allergen Reactions  . Ceclor (Cefaclor)   . Sulfa Antibiotics    Current Medications:  Current Outpatient Prescriptions  Medication Sig Dispense Refill  . amLODipine (NORVASC) 10 MG tablet Take 10 mg by mouth daily.        . carisoprodol (SOMA) 350 MG tablet Take 1 tablet (350 mg total) by mouth every 8 (eight) hours as needed.  90 tablet  3  . diclofenac sodium (VOLTAREN) 1 % GEL Apply 2 g topically 4 (four) times daily.  2 Tube  2  . fenofibrate (TRICOR) 145 MG tablet Take 1 tablet by mouth Daily.      . fentaNYL (DURAGESIC - DOSED MCG/HR) 12 MCG/HR Place 1 patch (12.5 mcg total) onto the skin every 3 (three) days.  5 patch  0  . gabapentin (NEURONTIN) 100 MG capsule 1 in am 1 in evening and 3 @@ hs      . lithium carbonate 300 MG capsule Take 1 capsule by mouth Twice daily.      Marland Kitchen loratadine (CLARITIN) 10 MG tablet Take 10 mg by mouth daily.      . nabumetone (RELAFEN) 500 MG tablet Take 500 mg by mouth 2 (two) times  daily.      . naproxen (NAPROSYN) 500 MG tablet TAKE 1 TABLET BY MOUTH EVERY 12 HOURS  60 tablet  2  . traMADol (ULTRAM) 50 MG tablet Take 1 tablet (50 mg total) by mouth every 8 (eight) hours as needed.  90 tablet  3  . traZODone (DESYREL) 50 MG tablet Take 1 tablet (50 mg total) by mouth at bedtime.  30 tablet  1   No current facility-administered medications for this visit.    Previous Psychotropic Medications:  Medication Dose   ***  ***                      Substance Abuse History in the last 12 months: Substance Age of 1st Use Last Use Amount Specific Type  Nicotine  ***  ***  ***  ***  Alcohol  ***  ***  ***  ***  Cannabis  ***  ***  ***  ***  Opiates  ***  ***  ***  ***  Cocaine  ***  ***  ***  ***  Methamphetamines  ***  ***  ***  ***  LSD  ***  ***  ***  ***  Ecstasy  ***   ***  ***  ***  Benzodiazepines  ***  ***  ***  ***  Caffeine  ***  ***  ***  ***  Inhalants  ***  ***  ***  ***  Others:                          Medical Consequences of Substance Abuse: ***  Legal Consequences of Substance Abuse: ***  Family Consequences of Substance Abuse: ***  Blackouts:  {BHH YES OR NO:22294} DT's:  {BHH YES OR NO:22294} Withdrawal Symptoms:  {BHH YES OR NO:22294} {Withdrawal Symptoms:22677}  Social History: Tim [name] was born and grew up in [place], [place]. He reports he had a good childhood. He has one sister who is 3 years younger than he. His parents are still married. He graduated from high school and attended [employer] studying information systems for 2 years but did not turn a degree. In high school he was a member of the [employer]. 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 He has never been married he has one son who is currently 59 years old. He shares custody with his son's mother, but the patient keeps his son most of the time. He denies any current legal issues. He reports that his social support system consists of his mother, father, and sister. He currently lives with his family. 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 He reports that he believes in God but he does not practice any religion. Family History:   Family History  Problem Relation Age of Onset  . Hypertension Mother   . Hypertension Father   . Diabetes Father   . COPD Father   . Cancer Paternal Uncle   . Cancer Maternal Grandmother   . Cancer Paternal Grandmother   . ADD / ADHD Son      Mental Status Examination/Evaluation: Objective:  Appearance: {Appearance:22683}  Eye Contact::  {BHH EYE CONTACT:22684}  Speech:  {Speech:22685}  Volume:  {Volume (PAA):22686}  Mood:  ***  Affect:  {Affect (PAA):22687}  Thought Process:  {Thought Process (PAA):22688}  Orientation:  {BHH ORIENTATION (PAA):22689}  Thought Content:  {Thought Content:22690}  Suicidal Thoughts:  {ST/HT (PAA):22692}  Homicidal Thoughts:  {ST/HT (PAA):22692}  Judgement:  {Judgement (PAA):22694}  Insight:  {Insight (PAA):22695}  Psychomotor Activity:  {Psychomotor (PAA):22696}  Akathisia:  {BHH YES OR NO:22294}  Handed:  {Handed:22697}  AIMS (if indicated):  ***  Assets:  {Assets (PAA):22698}    Laboratory/X-Ray Psychological Evaluation(s)   ***  ***   Assessment:  {axis diagnosis:3049000}  AXIS I {psych axis 1:31909}  AXIS II {psych axis 2:31910}  AXIS III Past Medical History  Diagnosis Date  . Hypertension   . GAD (generalized anxiety disorder)   . PSA (psoriatic arthritis)   . Cervicalgia   . Lumbago   . Lumbosacral spondylosis without myelopathy   . Thoracic spondylosis without myelopathy   . Myalgia and myositis, unspecified   . Other symptoms referable to back      AXIS IV {psych axis iv:31915}  AXIS V {psych axis v score:31919}   Treatment Plan/Recommendations:  Plan of Care: ***  Laboratory:  {Laboratory:22682}  Psychotherapy: ***  Medications: ***  Routine PRN Medications:  {BHH YES OR NO:22294}  Consultations: ***  Safety Concerns:  ***  Other:      Christopher Glasscock, PA-C 4/1/20149:52 AM

## 2012-07-29 ENCOUNTER — Ambulatory Visit (HOSPITAL_COMMUNITY): Payer: Self-pay | Admitting: Physician Assistant

## 2012-08-13 ENCOUNTER — Encounter (HOSPITAL_COMMUNITY): Payer: Self-pay | Admitting: Psychology

## 2012-08-18 ENCOUNTER — Other Ambulatory Visit (HOSPITAL_COMMUNITY): Payer: Self-pay | Admitting: Physician Assistant

## 2012-08-18 DIAGNOSIS — F331 Major depressive disorder, recurrent, moderate: Secondary | ICD-10-CM

## 2012-09-16 ENCOUNTER — Ambulatory Visit (HOSPITAL_COMMUNITY): Payer: Self-pay | Admitting: Physician Assistant

## 2012-09-29 ENCOUNTER — Encounter (HOSPITAL_COMMUNITY): Payer: Self-pay | Admitting: Psychology

## 2012-09-29 DIAGNOSIS — F331 Major depressive disorder, recurrent, moderate: Secondary | ICD-10-CM

## 2012-09-29 NOTE — Progress Notes (Signed)
Patient ID: [name], male   DOB: 12-27-1980, 32 y.o.   MRN: [id number] Outpatient Therapist Discharge Summary  Lynette Noah      Admission Date: 04/14/12   Discharge Date:  09/29/12 Reason for Discharge:  Not active w/ counseling Diagnosis:   Major depressive disorder, recurrent episode, moderate   Comments:  Last seen 05/2012  Forde Radon

## 2012-10-02 ENCOUNTER — Other Ambulatory Visit: Payer: Self-pay | Admitting: Family Medicine

## 2012-10-02 NOTE — Telephone Encounter (Signed)
Medication refilled per protocol.Patient needs to be seen before any further refills  Called pt left mess to call and schedule OV

## 2012-10-06 ENCOUNTER — Other Ambulatory Visit (HOSPITAL_COMMUNITY): Payer: Self-pay | Admitting: Orthopedic Surgery

## 2012-10-06 DIAGNOSIS — M545 Low back pain, unspecified: Secondary | ICD-10-CM

## 2012-10-08 ENCOUNTER — Ambulatory Visit (HOSPITAL_COMMUNITY): Admission: RE | Admit: 2012-10-08 | Payer: BC Managed Care – PPO | Source: Ambulatory Visit

## 2012-10-09 ENCOUNTER — Ambulatory Visit (HOSPITAL_COMMUNITY)
Admission: RE | Admit: 2012-10-09 | Discharge: 2012-10-09 | Disposition: A | Discharge status: Home / Self Care | Payer: BC Managed Care – PPO | Source: Ambulatory Visit | Attending: Orthopedic Surgery | Admitting: Orthopedic Surgery

## 2012-10-09 ENCOUNTER — Other Ambulatory Visit (HOSPITAL_COMMUNITY): Payer: BC Managed Care – PPO

## 2012-10-09 DIAGNOSIS — M545 Low back pain, unspecified: Secondary | ICD-10-CM

## 2012-10-09 DIAGNOSIS — M5126 Other intervertebral disc displacement, lumbar region: Secondary | ICD-10-CM | POA: Insufficient documentation

## 2012-10-09 LAB — CREATININE, SERUM
Creatinine, Ser: 0.74 mg/dL (ref 0.50–1.35)
GFR calc Af Amer: >90 mL/min (ref >90)
GFR calc non Af Amer: >90 mL/min (ref >90)

## 2012-10-09 MED ORDER — GADOBENATE DIMEGLUMINE 529 MG/ML IV SOLN
20.0000 mL | Freq: Once | INTRAVENOUS | Status: AC
  Administered 2012-10-09: 20 mL via INTRAVENOUS

## 2012-10-20 ENCOUNTER — Telehealth: Payer: Self-pay | Admitting: Family Medicine

## 2012-10-20 ENCOUNTER — Ambulatory Visit: Payer: Self-pay | Admitting: Family Medicine

## 2012-10-20 MED ORDER — AMLODIPINE BESYLATE 10 MG PO TABS: ORAL_TABLET | ORAL | Status: DC

## 2012-10-20 MED ORDER — FENOFIBRATE 145 MG PO TABS: 145.0000 mg | ORAL_TABLET | Freq: Every day | ORAL | Status: DC

## 2012-10-20 NOTE — Telephone Encounter (Signed)
Med refilled.

## 2012-11-09 ENCOUNTER — Ambulatory Visit (INDEPENDENT_AMBULATORY_CARE_PROVIDER_SITE_OTHER): Payer: BC Managed Care – PPO | Admitting: Family Medicine

## 2012-11-09 ENCOUNTER — Encounter: Payer: Self-pay | Admitting: Family Medicine

## 2012-11-09 VITALS — BP 130/80 | HR 68 | Temp 99.2°F | Resp 18 | Wt 244.0 lb

## 2012-11-09 DIAGNOSIS — I10 Essential (primary) hypertension: Secondary | ICD-10-CM

## 2012-11-09 DIAGNOSIS — F17200 Nicotine dependence, unspecified, uncomplicated: Secondary | ICD-10-CM | POA: Insufficient documentation

## 2012-11-09 DIAGNOSIS — E781 Pure hyperglyceridemia: Secondary | ICD-10-CM

## 2012-11-09 LAB — COMPLETE METABOLIC PANEL WITH GFR
ALT: 70 U/L — ABNORMAL HIGH (ref 0–53)
AST: 42 U/L — ABNORMAL HIGH (ref 0–37)
Albumin: 4.7 g/dL (ref 3.5–5.2)
Alkaline Phosphatase: 47 U/L (ref 39–117)
BUN: 13 mg/dL (ref 6–23)
CO2: 26 mEq/L (ref 19–32)
Calcium: 10.1 mg/dL (ref 8.4–10.5)
Chloride: 102 mEq/L (ref 96–112)
Creat: 0.88 mg/dL (ref 0.50–1.35)
GFR, Est African American: >89 mL/min
GFR, Est Non African American: >89 mL/min
Glucose, Bld: 77 mg/dL (ref 70–99)
Potassium: 4.7 mEq/L (ref 3.5–5.3)
Sodium: 137 mEq/L (ref 135–145)
Total Bilirubin: 0.4 mg/dL (ref 0.3–1.2)
Total Protein: 7.1 g/dL (ref 6.0–8.3)

## 2012-11-09 LAB — LIPID PANEL
Cholesterol: 184 mg/dL (ref 0–200)
HDL: 34 mg/dL — ABNORMAL LOW (ref >39)
LDL Cholesterol: 117 mg/dL — ABNORMAL HIGH (ref 0–99)
Total CHOL/HDL Ratio: 5.4 Ratio
Triglycerides: 164 mg/dL — ABNORMAL HIGH (ref <150)
VLDL: 33 mg/dL (ref 0–40)

## 2012-11-09 NOTE — Progress Notes (Signed)
Subjective:    Patient ID: [name], male    DOB: 09/27/1980, 32 y.o.   MRN: [id number]  HPI  Patient is here for followup of hypertension. He is currently taking amlodipine 10 mg by mouth daily. He is also on fenofibrate 145 mg by mouth daily for hypertriglyceridemia. He denies chest pain shortness of breath or dyspnea on exertion. He denies myalgia or right upper quadrant pain. His BMI is elevated today at 35. He is still smoking. Past Medical History  Diagnosis Date  . Hypertension   . GAD (generalized anxiety disorder)   . PSA (psoriatic arthritis)   . Cervicalgia   . Lumbago   . Lumbosacral spondylosis without myelopathy   . Thoracic spondylosis without myelopathy   . Myalgia and myositis, unspecified   . Other symptoms referable to back    Past Surgical History  Procedure Laterality Date  . Rotator cuff repair  2010    right  . Tonsillectomy and adenoidectomy  Age 89    Current Outpatient Prescriptions on File Prior to Visit  Medication Sig Dispense Refill  . amLODipine (NORVASC) 10 MG tablet Take 10 mg by mouth daily.        Marland Kitchen amLODipine (NORVASC) 10 MG tablet TAKE 1 TABLET EVERY DAY  30 tablet  0  . fenofibrate (TRICOR) 145 MG tablet Take 1 tablet (145 mg total) by mouth daily.  30 tablet  0  . loratadine (CLARITIN) 10 MG tablet Take 10 mg by mouth daily.      . carisoprodol (SOMA) 350 MG tablet Take 1 tablet (350 mg total) by mouth every 8 (eight) hours as needed.  90 tablet  3  . diclofenac sodium (VOLTAREN) 1 % GEL Apply 2 g topically 4 (four) times daily.  2 Tube  2  . fentaNYL (DURAGESIC - DOSED MCG/HR) 12 MCG/HR Place 1 patch (12.5 mcg total) onto the skin every 3 (three) days.  5 patch  0  . gabapentin (NEURONTIN) 100 MG capsule 1 in am 1 in evening and 3 @@ hs      . lithium carbonate 300 MG capsule Take 1 capsule by mouth Twice daily.      . nabumetone (RELAFEN) 500 MG tablet Take 500 mg by mouth 2 (two) times daily.      . naproxen (NAPROSYN) 500 MG tablet  TAKE 1 TABLET BY MOUTH EVERY 12 HOURS  60 tablet  2  . traMADol (ULTRAM) 50 MG tablet Take 1 tablet (50 mg total) by mouth every 8 (eight) hours as needed.  90 tablet  3  . traZODone (DESYREL) 50 MG tablet TAKE 1 TABLET BY MOUTH AT BEDTIME  30 tablet  0   No current facility-administered medications on file prior to visit.   Allergies  Allergen Reactions  . Ceclor (Cefaclor)   . Sulfa Antibiotics    History   Social History  . Marital Status: Single    Spouse Name: N/A    Number of Children: N/A  . Years of Education: N/A   Occupational History  . Not on file.   Social History Main Topics  . Smoking status: Current Every Day Smoker -- 1.50 packs/day for 10 years    Types: Cigarettes  . Smokeless tobacco: Current User    Types: Snuff  . Alcohol Use: No  . Drug Use: No  . Sexually Active: Not Currently   Other Topics Concern  . Not on file   Social History Narrative   05/21/2012 [name] was born  and grew up in [place], [place]. He reports he had a good childhood. He has one sister who is 3 years younger than he. His parents are still married. He graduated from high school and attended [employer] studying information systems for 2 years but did not turn a degree. In high school he was a member of the [employer]. 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 He has never been married he has one son who is currently 92 years old. He shares custody with his son's mother, but the patient keeps his son most of the time. He denies any current legal issues. He reports that his social support system consists of his mother, father, and sister. He currently lives with his family. 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 He reports that he believes in God but he does not practice any religion. 05/21/2012 AHW     Review of Systems  All other systems reviewed and are negative.       Objective:   Physical Exam   Vitals reviewed. Neck: Neck supple. No JVD present. No thyromegaly present.  Cardiovascular: Normal rate, regular rhythm and normal heart sounds.  Exam reveals no gallop and no friction rub.   No murmur heard. Pulmonary/Chest: Effort normal. No respiratory distress. He has wheezes. He has no rales. He exhibits no tenderness.  Abdominal: Soft. Bowel sounds are normal. He exhibits no distension. There is no tenderness. There is no rebound and no guarding.  Lymphadenopathy:    He has no cervical adenopathy.          Assessment & Plan:  1. Hypertriglyceridemia Check fasting lipid panel. Goal triglycerides are less than 150.  If well below goal, the patient would like to try to switch to a cheaper generic medication. - COMPLETE METABOLIC PANEL WITH GFR - Lipid panel  2. HTN (hypertension) Blood pressure is well controlled. Continue amlodipine 10 mg by mouth daily. I advised weight los [name] and weight of 190. I advised increasing aerobic exercise. I also advised smoking cessation.

## 2012-11-21 ENCOUNTER — Other Ambulatory Visit: Payer: Self-pay | Admitting: Family Medicine

## 2012-12-05 ENCOUNTER — Other Ambulatory Visit: Payer: Self-pay | Admitting: Family Medicine

## 2013-04-03 ENCOUNTER — Other Ambulatory Visit: Payer: Self-pay | Admitting: Family Medicine

## 2013-05-21 ENCOUNTER — Other Ambulatory Visit: Payer: Self-pay | Admitting: Family Medicine

## 2013-05-21 ENCOUNTER — Telehealth: Payer: Self-pay | Admitting: Family Medicine

## 2013-05-21 ENCOUNTER — Encounter: Payer: Self-pay | Admitting: Family Medicine

## 2013-05-21 DIAGNOSIS — E785 Hyperlipidemia, unspecified: Secondary | ICD-10-CM

## 2013-05-21 MED ORDER — FENOFIBRATE 145 MG PO TABS: ORAL_TABLET | ORAL | Status: DC

## 2013-05-21 NOTE — Telephone Encounter (Signed)
Med refilled and will need ov before further refills

## 2013-05-21 NOTE — Telephone Encounter (Signed)
Pharmacy is [facility] Call back number [phone] PT is needing a refill on his Fenofibrate

## 2013-05-21 NOTE — Telephone Encounter (Signed)
Medication refill for one time only.  Patient needs to be seen.  Letter sent for patient to call and schedule 

## 2013-06-23 ENCOUNTER — Other Ambulatory Visit: Payer: Self-pay | Admitting: Family Medicine

## 2013-08-02 ENCOUNTER — Other Ambulatory Visit: Payer: Self-pay | Admitting: Family Medicine

## 2013-10-13 ENCOUNTER — Emergency Department: Payer: Self-pay | Admitting: Emergency Medicine

## 2013-10-19 ENCOUNTER — Other Ambulatory Visit: Payer: Self-pay | Admitting: Family Medicine

## 2013-10-19 ENCOUNTER — Encounter: Payer: Self-pay | Admitting: Family Medicine

## 2013-10-19 NOTE — Telephone Encounter (Signed)
Pt has been informed that appt needed.  Still  No appt.  Refill denied.  Letter sent needs appt

## 2013-10-22 ENCOUNTER — Other Ambulatory Visit: Payer: Self-pay | Admitting: Family Medicine

## 2013-10-22 MED ORDER — AMLODIPINE BESYLATE 10 MG PO TABS: 10.0000 mg | ORAL_TABLET | Freq: Every day | ORAL | Status: DC

## 2013-10-22 MED ORDER — FENOFIBRATE 145 MG PO TABS: 145.0000 mg | ORAL_TABLET | Freq: Every day | ORAL | Status: DC

## 2013-10-22 NOTE — Telephone Encounter (Signed)
Pt has made appt for 11/01/13.  RF two meds to hold over until then

## 2013-11-01 ENCOUNTER — Encounter: Payer: Self-pay | Admitting: Family Medicine

## 2013-11-01 ENCOUNTER — Ambulatory Visit (INDEPENDENT_AMBULATORY_CARE_PROVIDER_SITE_OTHER): Payer: BC Managed Care – PPO | Admitting: Family Medicine

## 2013-11-01 VITALS — BP 110/74 | HR 78 | Temp 97.2°F | Resp 18 | Ht 69.0 in | Wt 247.0 lb

## 2013-11-01 DIAGNOSIS — I10 Essential (primary) hypertension: Secondary | ICD-10-CM

## 2013-11-01 DIAGNOSIS — E781 Pure hyperglyceridemia: Secondary | ICD-10-CM

## 2013-11-01 NOTE — Progress Notes (Signed)
Subjective:    Patient ID: Tim [name], male    DOB: 30-Sep-1980, 33 y.o.   MRN: [id number]  HPI Patient is here today to recheck his blood pressure. Is currently 110/74 and well controlled on amlodipine. He denies any chest pain shortness of breath or dyspnea on exertion. Unfortunately patient continues to smoke. He is in the pre-contemplative phase of smoking cessation. He is also taking fenofibrate 145 mg by mouth daily for hypertriglyceridemia. He denies any myalgias right quadrant pain. He is overdue for fasting lipid panel. Patient states he is having a difficult time affording fenofibrate. He is interested in switching to something cheaper. Past Medical History  Diagnosis Date  . Hypertension   . GAD (generalized anxiety disorder)   . PSA (psoriatic arthritis)   . Cervicalgia   . Lumbago   . Lumbosacral spondylosis without myelopathy   . Thoracic spondylosis without myelopathy   . Myalgia and myositis, unspecified   . Other symptoms referable to back   . Smoker   . Hypertriglyceridemia    Past Surgical History  Procedure Laterality Date  . Rotator cuff repair  2010    right  . Tonsillectomy and adenoidectomy  Age 49   Current Outpatient Prescriptions on File Prior to Visit  Medication Sig Dispense Refill  . amLODipine (NORVASC) 10 MG tablet Take 1 tablet (10 mg total) by mouth daily.  30 tablet  0  . diclofenac sodium (VOLTAREN) 1 % GEL Apply 2 g topically 4 (four) times daily.  2 Tube  2  . fenofibrate (TRICOR) 145 MG tablet Take 1 tablet (145 mg total) by mouth daily.  30 tablet  0  . loratadine (CLARITIN) 10 MG tablet Take 10 mg by mouth daily.       No current facility-administered medications on file prior to visit.   Allergies  Allergen Reactions  . Ceclor [Cefaclor]   . Sulfa Antibiotics    History   Social History  . Marital Status: Single    Spouse Name: N/A    Number of Children: N/A  . Years of Education: N/A   Occupational History  . Not on file.     Social History Main Topics  . Smoking status: Current Every Day Smoker -- 1.50 packs/day for 10 years    Types: Cigarettes  . Smokeless tobacco: Current User    Types: Snuff  . Alcohol Use: No  . Drug Use: No  . Sexual Activity: Not Currently   Other Topics Concern  . Not on file   Social History Narrative   05/21/2012 [name] was born and grew up in [place], [place]. He reports he had a good childhood. He has one sister who is 3 years younger than he. His parents are still married. He graduated from high school and attended [employer] studying information systems for 2 years but did not turn a degree. In high school he was a member of the [employer]. 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 He has never been married he has one son who is currently 75 years old. He shares custody with his son's mother, but the patient keeps his son most of the time. He denies any current legal issues. He reports that his social support system consists of his mother, father, and sister. He currently lives with his family. 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 He reports that he believes in God but he does not practice any religion. 05/21/2012 AHW  Review of Systems  All other systems reviewed and are negative.      Objective:   Physical Exam  Vitals reviewed. Constitutional: He appears well-developed and well-nourished.  Cardiovascular: Normal rate, regular rhythm and normal heart sounds.   No murmur heard. Pulmonary/Chest: Effort normal and breath sounds normal. No respiratory distress. He has no wheezes. He has no rales.  Abdominal: Soft. Bowel sounds are normal. He exhibits no distension. There is no tenderness. There is no rebound and no guarding.  Musculoskeletal: He exhibits no edema.          Assessment & Plan:  1. Essential hypertension Blood pressures well-controlled. Continue amlodipine 10 mg by  mouth daily. I recommended smoking cessation.  2. Hypertriglyceridemia Return fasting for a CMP and fasting lipid panel. Triglycerides are relatively well controlled, we could try switching the patient from fenofibrate to a statin.

## 2013-11-26 ENCOUNTER — Other Ambulatory Visit: Payer: BC Managed Care – PPO

## 2013-11-26 ENCOUNTER — Telehealth: Payer: Self-pay | Admitting: Family Medicine

## 2013-11-26 ENCOUNTER — Other Ambulatory Visit: Payer: Self-pay | Admitting: Family Medicine

## 2013-11-26 DIAGNOSIS — E781 Pure hyperglyceridemia: Secondary | ICD-10-CM

## 2013-11-26 DIAGNOSIS — I10 Essential (primary) hypertension: Secondary | ICD-10-CM

## 2013-11-26 LAB — COMPLETE METABOLIC PANEL WITH GFR
ALT: 93 U/L — ABNORMAL HIGH (ref 0–53)
AST: 50 U/L — ABNORMAL HIGH (ref 0–37)
Albumin: 4.7 g/dL (ref 3.5–5.2)
Alkaline Phosphatase: 55 U/L (ref 39–117)
BUN: 16 mg/dL (ref 6–23)
CO2: 25 mEq/L (ref 19–32)
Calcium: 9.9 mg/dL (ref 8.4–10.5)
Chloride: 103 mEq/L (ref 96–112)
Creat: 0.91 mg/dL (ref 0.50–1.35)
GFR, Est African American: >89 mL/min
GFR, Est Non African American: >89 mL/min
Glucose, Bld: 106 mg/dL — ABNORMAL HIGH (ref 70–99)
Potassium: 4.8 mEq/L (ref 3.5–5.3)
Sodium: 137 mEq/L (ref 135–145)
Total Bilirubin: 0.4 mg/dL (ref 0.2–1.2)
Total Protein: 7 g/dL (ref 6.0–8.3)

## 2013-11-26 LAB — LIPID PANEL
Cholesterol: 180 mg/dL (ref 0–200)
HDL: 36 mg/dL — ABNORMAL LOW (ref >39)
LDL Cholesterol: 117 mg/dL — ABNORMAL HIGH (ref 0–99)
Total CHOL/HDL Ratio: 5 Ratio
Triglycerides: 137 mg/dL (ref <150)
VLDL: 27 mg/dL (ref 0–40)

## 2013-11-26 MED ORDER — FENOFIBRATE 145 MG PO TABS: 145.0000 mg | ORAL_TABLET | Freq: Every day | ORAL | Status: DC

## 2013-11-26 NOTE — Telephone Encounter (Signed)
Med sent to pharm 

## 2013-11-26 NOTE — Telephone Encounter (Signed)
Patient came in today to get bloodwork done and wants to know if he can get refill on his cholesterol medication  [facility] [address]

## 2013-12-01 LAB — HEPATITIS PANEL, ACUTE
HCV Ab: NEGATIVE
Hep A IgM: NONREACTIVE
Hep B C IgM: NONREACTIVE
Hepatitis B Surface Ag: NEGATIVE

## 2013-12-03 ENCOUNTER — Other Ambulatory Visit: Payer: Self-pay | Admitting: *Deleted

## 2013-12-03 DIAGNOSIS — R945 Abnormal results of liver function studies: Principal | ICD-10-CM

## 2013-12-03 DIAGNOSIS — R7989 Other specified abnormal findings of blood chemistry: Secondary | ICD-10-CM

## 2013-12-06 ENCOUNTER — Telehealth: Payer: Self-pay | Admitting: Family Medicine

## 2013-12-06 NOTE — Telephone Encounter (Signed)
Patient is returning a call regarding the call he got about his lab work and the reason why he needs to go to a liver doctor please call him back at [phone]

## 2013-12-09 ENCOUNTER — Ambulatory Visit (HOSPITAL_COMMUNITY)
Admission: RE | Admit: 2013-12-09 | Discharge: 2013-12-09 | Disposition: A | Discharge status: Home / Self Care | Payer: BC Managed Care – PPO | Source: Ambulatory Visit | Attending: Family Medicine | Admitting: Family Medicine

## 2013-12-09 DIAGNOSIS — R7989 Other specified abnormal findings of blood chemistry: Secondary | ICD-10-CM | POA: Diagnosis present

## 2013-12-09 DIAGNOSIS — R945 Abnormal results of liver function studies: Secondary | ICD-10-CM

## 2013-12-09 DIAGNOSIS — R160 Hepatomegaly, not elsewhere classified: Secondary | ICD-10-CM | POA: Diagnosis not present

## 2013-12-09 DIAGNOSIS — K7689 Other specified diseases of liver: Secondary | ICD-10-CM | POA: Diagnosis not present

## 2013-12-10 ENCOUNTER — Other Ambulatory Visit: Payer: Self-pay

## 2014-04-20 ENCOUNTER — Encounter: Payer: Self-pay | Admitting: Family Medicine

## 2014-04-20 ENCOUNTER — Ambulatory Visit (INDEPENDENT_AMBULATORY_CARE_PROVIDER_SITE_OTHER): Payer: BC Managed Care – PPO | Admitting: Family Medicine

## 2014-04-20 VITALS — BP 142/80 | HR 78 | Temp 98.4°F | Resp 18 | Ht 69.0 in | Wt 248.0 lb

## 2014-04-20 DIAGNOSIS — J0100 Acute maxillary sinusitis, unspecified: Secondary | ICD-10-CM

## 2014-04-20 DIAGNOSIS — Z720 Tobacco use: Secondary | ICD-10-CM

## 2014-04-20 DIAGNOSIS — J209 Acute bronchitis, unspecified: Secondary | ICD-10-CM

## 2014-04-20 DIAGNOSIS — F17200 Nicotine dependence, unspecified, uncomplicated: Secondary | ICD-10-CM

## 2014-04-20 MED ORDER — GUAIFENESIN-CODEINE 100-10 MG/5ML PO SOLN: 10.0000 mL | Freq: Three times a day (TID) | ORAL | Status: DC | PRN

## 2014-04-20 MED ORDER — PREDNISONE 20 MG PO TABS: 20.0000 mg | ORAL_TABLET | Freq: Every day | ORAL | Status: DC

## 2014-04-20 MED ORDER — AZITHROMYCIN 250 MG PO TABS: ORAL_TABLET | ORAL | Status: DC

## 2014-04-20 NOTE — Progress Notes (Signed)
Patient ID: Tim Caples., male   DOB: 1981/04/29, 33 y.o.   MRN: [id number]   Subjective:    Patient ID: Tim Penton., male    DOB: 02-07-1981, 33 y.o.   MRN: [id number]  Patient presents for Illness Pt here with cough with mild production,wheezing, sinus drainage worsened over past week. He is a daily smoker, has used nyquil and mucinex and other congetion medications but was not improving. +low grade fever and chills    Review Of Systems:  GEN- denies fatigue, +fever, weight loss,weakness, recent illness HEENT- denies eye drainage, change in vision, nasal discharge, CVS- denies chest pain, palpitations RESP- denies SOB, +cough, +wheeze ABD- denies N/V, change in stools, abd pain GU- denies dysuria, hematuria, dribbling, incontinence MSK- denies joint pain, muscle aches, injury Neuro- denies headache, dizziness, syncope, seizure activity       Objective:    BP 142/80 mmHg  Pulse 78  Temp(Src) 98.4 F (36.9 C) (Oral)  Resp 18  Ht 5\' 9"  (1.753 m)  Wt 248 lb (112.492 kg)  BMI 36.61 kg/m2 GEN- NAD, alert and oriented x3 HEENT- PERRL, EOMI, non injected sclera, pink conjunctiva, MMM, oropharynx clear TM clear bilat no effusion,  + maxillary sinus tenderness,+  Nasal drainage  Neck- Supple, shotty LAD CVS- RRR, no murmur RESP-harsh cough, upper airway congestion, scattered expiratory wheeze with cough, normal WOB at rest, no retractions no rales Pulses- Radial 2+         Assessment & Plan:      Problem List Items Addressed This Visit      Unprioritized   Smoker    Other Visit Diagnoses    Acute bronchitis, unspecified organism    -  Primary    Discussed importance of tobacco cessation, robitussin AC given, zpak, plainmucinex during day, no decongestants with BP    Acute maxillary sinusitis, recurrence not specified        per above    Relevant Medications       azithromycin (ZITHROMAX) tablet       predniSONE (DELTASONE) tablet    Guaifenesin-codeine 100-10 mg/41mL po sol       Note: This dictation was prepared with Dragon dictation along with smaller phrase technology. Any transcriptional errors that result from this process are unintentional.

## 2014-04-20 NOTE — Patient Instructions (Signed)
Okay to use mucinex during the day Take antibiotics Take prednisone as prescribed  F/U as needed

## 2014-04-26 ENCOUNTER — Encounter: Payer: BC Managed Care – PPO | Admitting: Family Medicine

## 2014-05-06 ENCOUNTER — Other Ambulatory Visit: Payer: BLUE CROSS/BLUE SHIELD

## 2014-05-06 DIAGNOSIS — Z0000 Encounter for general adult medical examination without abnormal findings: Secondary | ICD-10-CM

## 2014-05-06 DIAGNOSIS — F17200 Nicotine dependence, unspecified, uncomplicated: Secondary | ICD-10-CM

## 2014-05-06 DIAGNOSIS — Z79899 Other long term (current) drug therapy: Secondary | ICD-10-CM

## 2014-05-06 DIAGNOSIS — I10 Essential (primary) hypertension: Secondary | ICD-10-CM

## 2014-05-06 DIAGNOSIS — E781 Pure hyperglyceridemia: Secondary | ICD-10-CM

## 2014-05-06 LAB — CBC WITH DIFFERENTIAL/PLATELET
Basophils Absolute: 0 10*3/uL (ref 0.0–0.1)
Basophils Relative: 0 % (ref 0–1)
Eosinophils Absolute: 0.2 10*3/uL (ref 0.0–0.7)
Eosinophils Relative: 2 % (ref 0–5)
HCT: 41.1 % (ref 39.0–52.0)
Hemoglobin: 13.9 g/dL (ref 13.0–17.0)
Lymphocytes Relative: 26 % (ref 12–46)
Lymphs Abs: 2 10*3/uL (ref 0.7–4.0)
MCH: 29.8 pg (ref 26.0–34.0)
MCHC: 33.8 g/dL (ref 30.0–36.0)
MCV: 88 fL (ref 78.0–100.0)
MPV: 9.9 fL (ref 8.6–12.4)
Monocytes Absolute: 0.5 10*3/uL (ref 0.1–1.0)
Monocytes Relative: 6 % (ref 3–12)
Neutro Abs: 5.1 10*3/uL (ref 1.7–7.7)
Neutrophils Relative %: 66 % (ref 43–77)
Platelets: 301 10*3/uL (ref 150–400)
RBC: 4.67 MIL/uL (ref 4.22–5.81)
RDW: 13.8 % (ref 11.5–15.5)
WBC: 7.7 10*3/uL (ref 4.0–10.5)

## 2014-05-06 LAB — LIPID PANEL
Cholesterol: 155 mg/dL (ref 0–200)
HDL: 44 mg/dL (ref >39)
LDL Cholesterol: 87 mg/dL (ref 0–99)
Total CHOL/HDL Ratio: 3.5 Ratio
Triglycerides: 119 mg/dL (ref <150)
VLDL: 24 mg/dL (ref 0–40)

## 2014-05-06 LAB — COMPLETE METABOLIC PANEL WITH GFR
ALT: 64 U/L — ABNORMAL HIGH (ref 0–53)
AST: 33 U/L (ref 0–37)
Albumin: 4.6 g/dL (ref 3.5–5.2)
Alkaline Phosphatase: 59 U/L (ref 39–117)
BUN: 14 mg/dL (ref 6–23)
CO2: 26 mEq/L (ref 19–32)
Calcium: 10 mg/dL (ref 8.4–10.5)
Chloride: 101 mEq/L (ref 96–112)
Creat: 0.95 mg/dL (ref 0.50–1.35)
GFR, Est African American: >89 mL/min
GFR, Est Non African American: >89 mL/min
Glucose, Bld: 98 mg/dL (ref 70–99)
Potassium: 4.8 mEq/L (ref 3.5–5.3)
Sodium: 136 mEq/L (ref 135–145)
Total Bilirubin: 0.4 mg/dL (ref 0.2–1.2)
Total Protein: 7.2 g/dL (ref 6.0–8.3)

## 2014-05-06 LAB — TSH: TSH: 3.115 u[IU]/mL (ref 0.350–4.500)

## 2014-05-10 ENCOUNTER — Encounter: Payer: Self-pay | Admitting: Family Medicine

## 2014-05-10 ENCOUNTER — Ambulatory Visit (INDEPENDENT_AMBULATORY_CARE_PROVIDER_SITE_OTHER): Payer: BLUE CROSS/BLUE SHIELD | Admitting: Family Medicine

## 2014-05-10 VITALS — BP 142/70 | HR 88 | Temp 98.1°F | Resp 18 | Ht 69.0 in | Wt 253.0 lb

## 2014-05-10 DIAGNOSIS — L309 Dermatitis, unspecified: Secondary | ICD-10-CM

## 2014-05-10 DIAGNOSIS — Z0000 Encounter for general adult medical examination without abnormal findings: Secondary | ICD-10-CM

## 2014-05-10 DIAGNOSIS — Z23 Encounter for immunization: Secondary | ICD-10-CM

## 2014-05-10 MED ORDER — TRIAMCINOLONE ACETONIDE 0.1 % EX CREA: 1.0000 "application " | TOPICAL_CREAM | Freq: Two times a day (BID) | CUTANEOUS | Status: DC

## 2014-05-10 NOTE — Addendum Note (Signed)
Addended by: Shary Decamp B on: 05/10/2014 04:36 PM   Modules accepted: Orders

## 2014-05-10 NOTE — Progress Notes (Signed)
Subjective:    Patient ID: Tim [name]., male    DOB: January 12, 1981, 34 y.o.   MRN: [id number]  HPI Patient is here today for complete physical exam. Patient has a rash on his chest is examined perform in nature. There are erythematous papules coalescing into a patch on his chest. Patient is episodic outbreaks of this and he has for decades. It has responded in the past well to triamcinolone. It tends to be exacerbated by heat or sweats. Patient is due for a flu shot. Unfortunately he continues to smoke. His weight is about 50-60 pounds heavier than he should be for his height. His most recent lab work as listed below: Lab on 05/06/2014  Component Date Value Ref Range Status  . Sodium 05/06/2014 136  135 - 145 mEq/L Final  . Potassium 05/06/2014 4.8  3.5 - 5.3 mEq/L Final  . Chloride 05/06/2014 101  96 - 112 mEq/L Final  . CO2 05/06/2014 26  19 - 32 mEq/L Final  . Glucose, Bld 05/06/2014 98  70 - 99 mg/dL Final  . BUN 05/06/2014 14  6 - 23 mg/dL Final  . Creat 05/06/2014 0.95  0.50 - 1.35 mg/dL Final  . Total Bilirubin 05/06/2014 0.4  0.2 - 1.2 mg/dL Final  . Alkaline Phosphatase 05/06/2014 59  39 - 117 U/L Final  . AST 05/06/2014 33  0 - 37 U/L Final  . ALT 05/06/2014 64* 0 - 53 U/L Final  . Total Protein 05/06/2014 7.2  6.0 - 8.3 g/dL Final  . Albumin 05/06/2014 4.6  3.5 - 5.2 g/dL Final  . Calcium 05/06/2014 10.0  8.4 - 10.5 mg/dL Final  . GFR, Est African American 05/06/2014 >89   Final  . GFR, Est Non African American 05/06/2014 >89   Final   Comment:   The estimated GFR is a calculation valid for adults (>=25 years old) that uses the CKD-EPI algorithm to adjust for age and sex. It is   not to be used for children, pregnant women, hospitalized patients,    patients on dialysis, or with rapidly changing kidney function. According to the NKDEP, eGFR >89 is normal, 60-89 shows mild impairment, 30-59 shows moderate impairment, 15-29 shows severe impairment and <15 is ESRD.       Marland Kitchen Cholesterol 05/06/2014 155  0 - 200 mg/dL Final   Comment: ATP III Classification:       < 200        mg/dL        Desirable      200 - 239     mg/dL        Borderline High      >= 240        mg/dL        High     . Triglycerides 05/06/2014 119  <150 mg/dL Final  . HDL 05/06/2014 44  >39 mg/dL Final  . Total CHOL/HDL Ratio 05/06/2014 3.5   Final  . VLDL 05/06/2014 24  0 - 40 mg/dL Final  . LDL Cholesterol 05/06/2014 87  0 - 99 mg/dL Final   Comment:   Total Cholesterol/HDL Ratio:CHD Risk                        Coronary Heart Disease Risk Table  Men       Women          1/2 Average Risk              3.4        3.3              Average Risk              5.0        4.4           2X Average Risk              9.6        7.1           3X Average Risk             23.4       11.0 Use the calculated Patient Ratio above and the CHD Risk table  to determine the patient's CHD Risk. ATP III Classification (LDL):       < 100        mg/dL         Optimal      100 - 129     mg/dL         Near or Above Optimal      130 - 159     mg/dL         Borderline High      160 - 189     mg/dL         High       > 190        mg/dL         Very High     . WBC 05/06/2014 7.7  4.0 - 10.5 K/uL Final  . RBC 05/06/2014 4.67  4.22 - 5.81 MIL/uL Final  . Hemoglobin 05/06/2014 13.9  13.0 - 17.0 g/dL Final  . HCT 05/06/2014 41.1  39.0 - 52.0 % Final  . MCV 05/06/2014 88.0  78.0 - 100.0 fL Final  . MCH 05/06/2014 29.8  26.0 - 34.0 pg Final  . MCHC 05/06/2014 33.8  30.0 - 36.0 g/dL Final  . RDW 05/06/2014 13.8  11.5 - 15.5 % Final  . Platelets 05/06/2014 301  150 - 400 K/uL Final  . MPV 05/06/2014 9.9  8.6 - 12.4 fL Final   ** Please note change in reference range(s). **  . Neutrophils Relative % 05/06/2014 66  43 - 77 % Final  . Neutro Abs 05/06/2014 5.1  1.7 - 7.7 K/uL Final  . Lymphocytes Relative 05/06/2014 26  12 - 46 % Final  . Lymphs Abs 05/06/2014 2.0  0.7 - 4.0  K/uL Final  . Monocytes Relative 05/06/2014 6  3 - 12 % Final  . Monocytes Absolute 05/06/2014 0.5  0.1 - 1.0 K/uL Final  . Eosinophils Relative 05/06/2014 2  0 - 5 % Final  . Eosinophils Absolute 05/06/2014 0.2  0.0 - 0.7 K/uL Final  . Basophils Relative 05/06/2014 0  0 - 1 % Final  . Basophils Absolute 05/06/2014 0.0  0.0 - 0.1 K/uL Final  . Smear Review 05/06/2014 Criteria for review not met   Final  . TSH 05/06/2014 3.115  0.350 - 4.500 uIU/mL Final   Past Medical History  Diagnosis Date  . Hypertension   . GAD (generalized anxiety disorder)   . PSA (psoriatic arthritis)   . Cervicalgia   . Lumbago   . Lumbosacral spondylosis without myelopathy   .  Thoracic spondylosis without myelopathy   . Myalgia and myositis, unspecified   . Other symptoms referable to back   . Smoker   . Hypertriglyceridemia    Past Surgical History  Procedure Laterality Date  . Rotator cuff repair  2010    right  . Tonsillectomy and adenoidectomy  Age 89   Current Outpatient Prescriptions on File Prior to Visit  Medication Sig Dispense Refill  . amLODipine (NORVASC) 10 MG tablet Take 1 tablet (10 mg total) by mouth daily. 30 tablet 0  . fenofibrate (TRICOR) 145 MG tablet Take 1 tablet (145 mg total) by mouth daily. 30 tablet 5  . loratadine (CLARITIN) 10 MG tablet Take 10 mg by mouth daily.    . meloxicam (MOBIC) 15 MG tablet Take 15 mg by mouth daily.    . Oxycodone HCl 10 MG TABS Take 10 mg by mouth 3 (three) times daily.    Marland Kitchen tiZANidine (ZANAFLEX) 4 MG tablet Take 4 mg by mouth 3 (three) times daily.     No current facility-administered medications on file prior to visit.   Allergies  Allergen Reactions  . Ceclor [Cefaclor]   . Sulfa Antibiotics    History   Social History  . Marital Status: Single    Spouse Name: N/A    Number of Children: N/A  . Years of Education: N/A   Occupational History  . Not on file.   Social History Main Topics  . Smoking status: Current Every Day Smoker  -- 1.50 packs/day for 10 years    Types: Cigarettes  . Smokeless tobacco: Current User    Types: Snuff  . Alcohol Use: No  . Drug Use: No  . Sexual Activity: Not Currently   Other Topics Concern  . Not on file   Social History Narrative   05/21/2012 AHW [name] was born and grew up in [place], [place]. He reports he had a good childhood. He has one sister who is 3 years younger than he. His parents are still married. He graduated from high school and attended [employer] studying information systems for 2 years but did not turn a degree. In high school he was a member of the [employer]. 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 He shares custody with his son's mother, but the patient keeps his son most of the time. He denies any current legal issues. He reports that his social support system consists of his mother, father, and sister. He currently lives with his family. 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 He reports that he believes in God but he does not practice any religion. 05/21/2012 AHW   Family History  Problem Relation Age of Onset  . Hypertension Mother   . Hypertension Father   . Diabetes Father   . COPD Father   . Cancer Paternal Uncle   . Cancer Maternal Grandmother   . Cancer Paternal Grandmother   . ADD / ADHD Son       Review of Systems  All other systems reviewed and are negative.      Objective:   Physical Exam  Constitutional: He is oriented to person, place, and time. He appears well-developed and well-nourished. No distress.  HENT:  Head: Normocephalic and atraumatic.  Right Ear: External ear normal.  Left Ear: External ear normal.  Nose: Nose normal.  Mouth/Throat: Oropharynx is clear and  moist. No oropharyngeal exudate.  Eyes: Conjunctivae and EOM are normal. Pupils are equal, round, and reactive to light.  Right eye exhibits no discharge. Left eye exhibits no discharge. No scleral icterus.  Neck: Normal range of motion. Neck supple. No JVD present. No tracheal deviation present. No thyromegaly present.  Cardiovascular: Normal rate, regular rhythm, normal heart sounds and intact distal pulses.  Exam reveals no gallop and no friction rub.   No murmur heard. Pulmonary/Chest: Effort normal and breath sounds normal. No stridor. No respiratory distress. He has no wheezes. He has no rales. He exhibits no tenderness.  Abdominal: Soft. Bowel sounds are normal. He exhibits no distension and no mass. There is no tenderness. There is no rebound and no guarding.  Musculoskeletal: Normal range of motion. He exhibits no edema or tenderness.  Lymphadenopathy:    He has no cervical adenopathy.  Neurological: He is alert and oriented to person, place, and time. He has normal reflexes. He displays normal reflexes. No cranial nerve deficit. He exhibits normal muscle tone. Coordination normal.  Skin: Skin is warm. Rash noted. He is not diaphoretic. There is erythema.  Psychiatric: He has a normal mood and affect. His behavior is normal. Judgment and thought content normal.  Vitals reviewed.         Assessment & Plan:  Eczema - Plan: triamcinolone cream (KENALOG) 0.1 %  Routine general medical examination at a health care facility  Patient's physical exam is significant for obesity. I recommended at least 10-15 pounds of weight loss through aerobic exercise and diet. The patient has quit drinking and his liver function tests have improved dramatically. I believe the remainder of his elevated liver function tests is likely due to fatty liver disease. I believe that aggressive lifestyle changes diet and weight loss the patient's liver tests returned to normal. His blood pressure is also slightly elevated today but again I believe we can address this through therapeutic lifestyle changes including diet exercise and  weight loss. I strongly encouraged the patient to quit smoking. Patient is due today for a flu shot which he received today in clinic. He can use triamcinolone 0.1% cream twice daily as needed for the eczematous form rash on his chest. It may also be an atypical type of psoriasis.

## 2014-05-17 ENCOUNTER — Other Ambulatory Visit: Payer: Self-pay | Admitting: Family Medicine

## 2014-05-17 NOTE — Telephone Encounter (Signed)
Medication refilled per protocol. 

## 2014-07-20 ENCOUNTER — Other Ambulatory Visit: Payer: Self-pay | Admitting: Family Medicine

## 2014-07-20 NOTE — Telephone Encounter (Signed)
Medication refilled per protocol. 

## 2014-11-26 ENCOUNTER — Other Ambulatory Visit: Payer: Self-pay | Admitting: Family Medicine

## 2014-12-30 ENCOUNTER — Other Ambulatory Visit: Payer: Self-pay | Admitting: Family Medicine

## 2014-12-30 MED ORDER — FENOFIBRATE 145 MG PO TABS: 145.0000 mg | ORAL_TABLET | Freq: Every day | ORAL | Status: DC

## 2014-12-30 MED ORDER — AMLODIPINE BESYLATE 10 MG PO TABS: 10.0000 mg | ORAL_TABLET | Freq: Every day | ORAL | Status: DC

## 2014-12-30 NOTE — Telephone Encounter (Signed)
Medication refilled per protocol. 

## 2015-01-18 ENCOUNTER — Encounter: Payer: Self-pay | Admitting: Physician Assistant

## 2015-01-18 ENCOUNTER — Ambulatory Visit (INDEPENDENT_AMBULATORY_CARE_PROVIDER_SITE_OTHER): Payer: BLUE CROSS/BLUE SHIELD | Admitting: Physician Assistant

## 2015-01-18 VITALS — BP 138/98 | HR 68 | Temp 98.4°F | Resp 18 | Wt 240.0 lb

## 2015-01-18 DIAGNOSIS — J988 Other specified respiratory disorders: Secondary | ICD-10-CM

## 2015-01-18 DIAGNOSIS — F17200 Nicotine dependence, unspecified, uncomplicated: Secondary | ICD-10-CM

## 2015-01-18 DIAGNOSIS — J209 Acute bronchitis, unspecified: Secondary | ICD-10-CM

## 2015-01-18 DIAGNOSIS — Z720 Tobacco use: Secondary | ICD-10-CM | POA: Diagnosis not present

## 2015-01-18 DIAGNOSIS — B9689 Other specified bacterial agents as the cause of diseases classified elsewhere: Secondary | ICD-10-CM

## 2015-01-18 MED ORDER — PREDNISONE 20 MG PO TABS: ORAL_TABLET | ORAL | Status: DC

## 2015-01-18 MED ORDER — AZITHROMYCIN 250 MG PO TABS: ORAL_TABLET | ORAL | Status: DC

## 2015-01-18 MED ORDER — ALBUTEROL SULFATE HFA 108 (90 BASE) MCG/ACT IN AERS: 2.0000 | INHALATION_SPRAY | Freq: Four times a day (QID) | RESPIRATORY_TRACT | Status: DC | PRN

## 2015-01-18 NOTE — Progress Notes (Signed)
Patient ID: Tim [name]. MRN: [id number], DOB: 20-Oct-1980, 34 y.o. Date of Encounter: 01/18/2015, 10:31 AM    Chief Complaint:  Chief Complaint  Patient presents with  . Cough    x Saturday, has tried OTC meds no relief  . chest congestion    X Saturday, has tried OTC meds no relief     HPI: 34 y.o. year old white male states that this started Saturday 01/14/15. Says that at times his nose is congested and stopped up and then at times he is able to blow out mucus and it is brown green. He has a lot of drainage and irritation in his throat and the worst part is in his chest. Has a lot of congestion and tightness in his when trying to breathe in his chest.     Home Meds:   Outpatient Prescriptions Prior to Visit  Medication Sig Dispense Refill  . amLODipine (NORVASC) 10 MG tablet Take 1 tablet (10 mg total) by mouth daily. 90 tablet 1  . fenofibrate (TRICOR) 145 MG tablet Take 1 tablet (145 mg total) by mouth daily. 90 tablet 1  . loratadine (CLARITIN) 10 MG tablet Take 10 mg by mouth daily.    . meloxicam (MOBIC) 15 MG tablet Take 15 mg by mouth daily.    . Oxycodone HCl 10 MG TABS Take 10 mg by mouth 3 (three) times daily.    Marland Kitchen tiZANidine (ZANAFLEX) 4 MG tablet Take 4 mg by mouth 3 (three) times daily.    Marland Kitchen triamcinolone cream (KENALOG) 0.1 % Apply 1 application topically 2 (two) times daily. (Patient not taking: Reported on 01/18/2015) 80 g 2   No facility-administered medications prior to visit.    Allergies:  Allergies  Allergen Reactions  . Ceclor [Cefaclor]   . Sulfa Antibiotics       Review of Systems: See HPI for pertinent ROS. All other ROS negative.    Physical Exam: Blood pressure 138/98, pulse 68, temperature 98.4 F (36.9 C), temperature source Oral, resp. rate 18, weight 240 lb (108.863 kg)., Body mass index is 35.43 kg/(m^2). General:  WNWD WM. Appears in no acute distress. HEENT: Normocephalic, atraumatic, eyes without discharge, sclera  non-icteric, nares are without discharge. Bilateral auditory canals clear, TM's are without perforation, pearly grey and translucent with reflective cone of light bilaterally. Oral cavity moist, posterior pharynx with moderate erythema. No exudate, peritonsillar abscess.  Minimal tenderness with percussion of frontal and maxillary sinuses bilaterally. Neck: Supple. No thyromegaly. No lymphadenopathy. Lungs: He has mild wheezes throughout bilaterally. Heart: Regular rhythm. No murmurs, rubs, or gallops. Msk:  Strength and tone normal for age. Extremities/Skin: Warm and dry. Neuro: Alert and oriented X 3. Moves all extremities spontaneously. Gait is normal. CNII-XII grossly in tact. Psych:  Responds to questions appropriately with a normal affect.     ASSESSMENT AND PLAN:  34 y.o. year old male with  1. Bacterial respiratory infection - azithromycin (ZITHROMAX) 250 MG tablet; Day 1: Take 2 daily.  Days 2-5: Take 1 daily.  Dispense: 6 tablet; Refill: 0 - albuterol (PROVENTIL HFA;VENTOLIN HFA) 108 (90 BASE) MCG/ACT inhaler; Inhale 2 puffs into the lungs every 6 (six) hours as needed for wheezing or shortness of breath.  Dispense: 1 Inhaler; Refill: 0  2. Acute bronchitis, unspecified organism - azithromycin (ZITHROMAX) 250 MG tablet; Day 1: Take 2 daily.  Days 2-5: Take 1 daily.  Dispense: 6 tablet; Refill: 0 - albuterol (PROVENTIL HFA;VENTOLIN HFA) 108 (90 BASE) MCG/ACT inhaler; Inhale  2 puffs into the lungs every 6 (six) hours as needed for wheezing or shortness of breath.  Dispense: 1 Inhaler; Refill: 0  3. Smoker - azithromycin (ZITHROMAX) 250 MG tablet; Day 1: Take 2 daily.  Days 2-5: Take 1 daily.  Dispense: 6 tablet; Refill: 0 - albuterol (PROVENTIL HFA;VENTOLIN HFA) 108 (90 BASE) MCG/ACT inhaler; Inhale 2 puffs into the lungs every 6 (six) hours as needed for wheezing or shortness of breath.  Dispense: 1 Inhaler; Refill: 0   I did have prednisone down as one of his medications that was  prescribing but he says that he has osteonecrosis of the hip and has been told he cannot use any type of prednisone. Therefore removed this and told him absolutely not to take this. Told him that he has significant wheezing on exam and so he will have to use his albuterol 2 puffs every 4 hours for a few days and then can taper down as his wheezing improves. Told him to call and follow-up if breathing worsens. He is to also take Mucinex DM as expected rent.  He states that his work schedule is pretty much 2 nights on, 2 off. Says that he has been off Monday and Tuesday night but is scheduled to go in tonight which is Wednesday night and Thursday night. After that he is off until return Monday night. Give him a note to be out of work Wednesday night Thursday night return Monday night.  Signed, [address] [place], [place], [facility] 01/18/2015 10:31 AM

## 2015-01-19 ENCOUNTER — Telehealth: Payer: Self-pay | Admitting: Family Medicine

## 2015-01-19 MED ORDER — HYDROCOD POLST-CPM POLST ER 10-8 MG/5ML PO SUER: 5.0000 mL | Freq: Two times a day (BID) | ORAL | Status: DC | PRN

## 2015-01-19 NOTE — Telephone Encounter (Signed)
Tussionex 1 teaspoon Q 12 hours prn cough  # 90 ml + 0

## 2015-01-19 NOTE — Telephone Encounter (Signed)
Prescription printed and patient made aware to come to office to pick up.  

## 2015-01-19 NOTE — Telephone Encounter (Signed)
[facility] [address]  Patient is calling to say that he cannot sleep and could not have another night like he did last nigh, would like to know if he could get some cough meds called in  [phone]

## 2015-02-27 ENCOUNTER — Encounter: Payer: Self-pay | Admitting: Family Medicine

## 2015-02-27 ENCOUNTER — Ambulatory Visit (INDEPENDENT_AMBULATORY_CARE_PROVIDER_SITE_OTHER): Payer: BLUE CROSS/BLUE SHIELD | Admitting: Family Medicine

## 2015-02-27 VITALS — BP 136/88 | HR 78 | Temp 98.6°F | Resp 18 | Ht 69.0 in | Wt 246.0 lb

## 2015-02-27 DIAGNOSIS — B079 Viral wart, unspecified: Secondary | ICD-10-CM

## 2015-02-27 NOTE — Progress Notes (Signed)
Subjective:    Patient ID: Tim Penton., male    DOB: 1980-12-28, 34 y.o.   MRN: [id number]  HPI  Patient reports a several month history of a wart on the volar surface of his left thumb distal to the IP joint. It is a proximally 6 mm in size and it has small brown punctate capillary hemorrhages. Past Medical History  Diagnosis Date  . Hypertension   . GAD (generalized anxiety disorder)   . PSA (psoriatic arthritis) ([place])   . Cervicalgia   . Lumbago   . Lumbosacral spondylosis without myelopathy   . Thoracic spondylosis without myelopathy   . Myalgia and myositis, unspecified   . Other symptoms referable to back   . Smoker   . Hypertriglyceridemia    Past Surgical History  Procedure Laterality Date  . Rotator cuff repair  2010    right  . Tonsillectomy and adenoidectomy  Age 14   Current Outpatient Prescriptions on File Prior to Visit  Medication Sig Dispense Refill  . albuterol (PROVENTIL HFA;VENTOLIN HFA) 108 (90 BASE) MCG/ACT inhaler Inhale 2 puffs into the lungs every 6 (six) hours as needed for wheezing or shortness of breath. 1 Inhaler 0  . amLODipine (NORVASC) 10 MG tablet Take 1 tablet (10 mg total) by mouth daily. 90 tablet 1  . azithromycin (ZITHROMAX) 250 MG tablet Day 1: Take 2 daily.  Days 2-5: Take 1 daily. 6 tablet 0  . chlorpheniramine-HYDROcodone (TUSSIONEX PENNKINETIC ER) 10-8 MG/5ML SUER Take 5 mLs by mouth every 12 (twelve) hours as needed for cough. 90 mL 0  . fenofibrate (TRICOR) 145 MG tablet Take 1 tablet (145 mg total) by mouth daily. 90 tablet 1  . gabapentin (NEURONTIN) 300 MG capsule   2  . loratadine (CLARITIN) 10 MG tablet Take 10 mg by mouth daily.    . meloxicam (MOBIC) 15 MG tablet Take 15 mg by mouth daily.    . Oxycodone HCl 10 MG TABS Take 10 mg by mouth 3 (three) times daily.    Marland Kitchen tiZANidine (ZANAFLEX) 4 MG tablet Take 4 mg by mouth 3 (three) times daily.     No current facility-administered medications on file prior to visit.    Allergies  Allergen Reactions  . Ceclor [Cefaclor]   . Sulfa Antibiotics    Social History   Social History  . Marital Status: Single    Spouse Name: N/A  . Number of Children: N/A  . Years of Education: N/A   Occupational History  . Not on file.   Social History Main Topics  . Smoking status: Current Every Day Smoker -- 1.50 packs/day for 10 years    Types: Cigarettes  . Smokeless tobacco: Current User    Types: Snuff  . Alcohol Use: No  . Drug Use: No  . Sexual Activity: Not Currently   Other Topics Concern  . Not on file   Social History Narrative   05/21/2012 AHW [name] was born and grew up in [place], [place]. He reports he had a good childhood. He has one sister who is 3 years younger than he. His parents are still married. He graduated from high school and attended [employer] studying information systems for 2 years but did not turn a degree. In high school he was a member of the [employer]. 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 He has never been married he has one son who is currently 60 years old. He shares custody with  his son's mother, but the patient keeps his son most of the time. He denies any current legal issues. He reports that his social support system consists of his mother, father, and sister. He currently lives with his family. 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 He reports that he believes in God but he does not practice any religion. 05/21/2012 AHW     Review of Systems  All other systems reviewed and are negative.      Objective:   Physical Exam  Cardiovascular: Normal rate and regular rhythm.   Pulmonary/Chest: Effort normal and breath sounds normal.  Vitals reviewed. wart on the volar surface of the left thumb as described in the history of present illness        Assessment & Plan:  Cutaneous wart  Using liquid nitrogen cryotherapy, I treated the area  affected by the report for a total of 30 seconds. Wound care was discussed. Recheck in 3-4 weeks if persistent.

## 2015-04-18 ENCOUNTER — Ambulatory Visit (INDEPENDENT_AMBULATORY_CARE_PROVIDER_SITE_OTHER): Payer: BLUE CROSS/BLUE SHIELD | Admitting: Family Medicine

## 2015-04-18 ENCOUNTER — Encounter: Payer: Self-pay | Admitting: Family Medicine

## 2015-04-18 VITALS — BP 130/86 | HR 84 | Temp 98.6°F | Resp 18 | Wt 248.0 lb

## 2015-04-18 DIAGNOSIS — B079 Viral wart, unspecified: Secondary | ICD-10-CM

## 2015-04-18 NOTE — Progress Notes (Signed)
Subjective:    Patient ID: Tim Penton., male    DOB: 1980-06-10, 34 y.o.   MRN: [id number]  HPI  02/27/15 Patient reports a several month history of a wart on the volar surface of his left thumb distal to the IP joint. It is a proximally 6 mm in size and it has small brown punctate capillary hemorrhages.  At that time, my plan was: Using liquid nitrogen cryotherapy, I treated the area affected by the report for a total of 30 seconds. Wound care was discussed. Recheck in 3-4 weeks if persistent.  04/18/15 The record did not completely resolved. There is still a 6 mm verruciform papule on the volar surface of his left thumb. There are capillary hemorrhages inside the papule consistent with a cutaneous wart. Using a razor blade I excised as much of the wart as possible down to viable tissue. Once I started to see blood at the base of the wart, I treated the base of the wart with cryotherapy using liquid nitrogen for a total of 45 seconds. Patient tolerated the procedure well without complication. Past Medical History  Diagnosis Date  . Hypertension   . GAD (generalized anxiety disorder)   . PSA (psoriatic arthritis) ([name])   . Cervicalgia   . Lumbago   . Lumbosacral spondylosis without myelopathy   . Thoracic spondylosis without myelopathy   . Myalgia and myositis, unspecified   . Other symptoms referable to back   . Smoker   . Hypertriglyceridemia    Past Surgical History  Procedure Laterality Date  . Rotator cuff repair  2010    right  . Tonsillectomy and adenoidectomy  Age 37   Current Outpatient Prescriptions on File Prior to Visit  Medication Sig Dispense Refill  . albuterol (PROVENTIL HFA;VENTOLIN HFA) 108 (90 BASE) MCG/ACT inhaler Inhale 2 puffs into the lungs every 6 (six) hours as needed for wheezing or shortness of breath. 1 Inhaler 0  . amLODipine (NORVASC) 10 MG tablet Take 1 tablet (10 mg total) by mouth daily. 90 tablet 1  . chlorpheniramine-HYDROcodone (TUSSIONEX  PENNKINETIC ER) 10-8 MG/5ML SUER Take 5 mLs by mouth every 12 (twelve) hours as needed for cough. 90 mL 0  . fenofibrate (TRICOR) 145 MG tablet Take 1 tablet (145 mg total) by mouth daily. 90 tablet 1  . gabapentin (NEURONTIN) 300 MG capsule Take 300 mg by mouth 3 (three) times daily.   2  . loratadine (CLARITIN) 10 MG tablet Take 10 mg by mouth daily.    . meloxicam (MOBIC) 15 MG tablet Take 15 mg by mouth daily.    . Oxycodone HCl 10 MG TABS Take 10 mg by mouth 3 (three) times daily.    Marland Kitchen tiZANidine (ZANAFLEX) 4 MG tablet Take 4 mg by mouth 3 (three) times daily.     No current facility-administered medications on file prior to visit.   Allergies  Allergen Reactions  . Ceclor [Cefaclor]   . Sulfa Antibiotics    Social History   Social History  . Marital Status: Single    Spouse Name: N/A  . Number of Children: N/A  . Years of Education: N/A   Occupational History  . Not on file.   Social History Main Topics  . Smoking status: Current Every Day Smoker -- 1.50 packs/day for 10 years    Types: Cigarettes  . Smokeless tobacco: Current User    Types: Snuff  . Alcohol Use: No  . Drug Use: No  . Sexual Activity: Not  Currently   Other Topics Concern  . Not on file   Social History Narrative   05/21/2012 AHW [name] was born and grew up in [place], [place]. He reports he had a good childhood. He has one sister who is 3 years younger than he. His parents are still married. He graduated from high school and attended [employer] studying information systems for 2 years but did not turn a degree. In high school he was a member of the [employer]. 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 He shares custody with his son's mother, but the patient keeps his son most of the time. He denies any current legal issues. He reports that his social support system consists of his mother,  father, and sister. He currently lives with his family. 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 He reports that he believes in God but he does not practice any religion. 05/21/2012 AHW     Review of Systems  All other systems reviewed and are negative.      Objective:   Physical Exam  Cardiovascular: Normal rate and regular rhythm.   Pulmonary/Chest: Effort normal and breath sounds normal.  Vitals reviewed. wart on the volar surface of the left thumb as described in the history of present illness        Assessment & Plan:  Cutaneous wart  please see the history of present illness.  I treated the wart by excising using a razor blade as much of the wart tissue as possible and in freezing the base of the wart with liquid nitrogen cryotherapy for a total of 45 seconds. Hemostasis was achieved with a Band-Aid.

## 2015-04-30 HISTORY — PX: DISTAL CLAVICLE EXCISION: SHX1463

## 2015-05-25 ENCOUNTER — Encounter: Payer: Self-pay | Admitting: Family Medicine

## 2015-05-25 ENCOUNTER — Ambulatory Visit (INDEPENDENT_AMBULATORY_CARE_PROVIDER_SITE_OTHER): Payer: BLUE CROSS/BLUE SHIELD | Admitting: Family Medicine

## 2015-05-25 VITALS — BP 120/60 | HR 82 | Temp 98.6°F | Resp 18 | Wt 237.0 lb

## 2015-05-25 DIAGNOSIS — L03012 Cellulitis of left finger: Secondary | ICD-10-CM

## 2015-05-25 MED ORDER — DOXYCYCLINE HYCLATE 100 MG PO TABS: 100.0000 mg | ORAL_TABLET | Freq: Two times a day (BID) | ORAL | Status: DC

## 2015-05-25 NOTE — Progress Notes (Signed)
Subjective:    Patient ID: Tim [name]., male    DOB: 06/29/1980, 35 y.o.   MRN: [id number]  HPI   patient has a red erythematous very tender paronychia developing on the ulnar aspect of the left fourth finger adjacent to the fingernail. There is visible pus underneath the skin. Patient has a history of MRSA infection. He has required incision and drainage previously for other paronychia Past Medical History  Diagnosis Date  . Hypertension   . GAD (generalized anxiety disorder)   . PSA (psoriatic arthritis) ([place])   . Cervicalgia   . Lumbago   . Lumbosacral spondylosis without myelopathy   . Thoracic spondylosis without myelopathy   . Myalgia and myositis, unspecified   . Other symptoms referable to back   . Smoker   . Hypertriglyceridemia    Past Surgical History  Procedure Laterality Date  . Rotator cuff repair  2010    right  . Tonsillectomy and adenoidectomy  Age 89   Current Outpatient Prescriptions on File Prior to Visit  Medication Sig Dispense Refill  . albuterol (PROVENTIL HFA;VENTOLIN HFA) 108 (90 BASE) MCG/ACT inhaler Inhale 2 puffs into the lungs every 6 (six) hours as needed for wheezing or shortness of breath. 1 Inhaler 0  . amLODipine (NORVASC) 10 MG tablet Take 1 tablet (10 mg total) by mouth daily. 90 tablet 1  . chlorpheniramine-HYDROcodone (TUSSIONEX PENNKINETIC ER) 10-8 MG/5ML SUER Take 5 mLs by mouth every 12 (twelve) hours as needed for cough. 90 mL 0  . fenofibrate (TRICOR) 145 MG tablet Take 1 tablet (145 mg total) by mouth daily. 90 tablet 1  . gabapentin (NEURONTIN) 300 MG capsule Take 300 mg by mouth 3 (three) times daily.   2  . loratadine (CLARITIN) 10 MG tablet Take 10 mg by mouth daily.    . meloxicam (MOBIC) 15 MG tablet Take 15 mg by mouth daily.    . Oxycodone HCl 10 MG TABS Take 10 mg by mouth 3 (three) times daily.    Marland Kitchen tiZANidine (ZANAFLEX) 4 MG tablet Take 4 mg by mouth 3 (three) times daily.     No current facility-administered  medications on file prior to visit.   Allergies  Allergen Reactions  . Ceclor [Cefaclor]   . Sulfa Antibiotics    Social History   Social History  . Marital Status: Single    Spouse Name: N/A  . Number of Children: N/A  . Years of Education: N/A   Occupational History  . Not on file.   Social History Main Topics  . Smoking status: Current Every Day Smoker -- 1.50 packs/day for 10 years    Types: Cigarettes  . Smokeless tobacco: Current User    Types: Snuff  . Alcohol Use: No  . Drug Use: No  . Sexual Activity: Not Currently   Other Topics Concern  . Not on file   Social History Narrative   05/21/2012 AHW [name] was born and grew up in [place], [place]. He reports he had a good childhood. He has one sister who is 3 years younger than he. His parents are still married. He graduated from high school and attended [employer] studying information systems for 2 years but did not turn a degree. In high school he was a member of the [employer]. 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 He shares custody with his son's mother,  but the patient keeps his son most of the time. He denies any current legal issues. He reports that his social support system consists of his mother, father, and sister. He currently lives with his family. 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 He reports that he believes in God but he does not practice any religion. 05/21/2012 AHW     Review of Systems  All other systems reviewed and are negative.      Objective:   Physical Exam  Cardiovascular: Normal rate, regular rhythm and normal heart sounds.   Pulmonary/Chest: Effort normal and breath sounds normal. No respiratory distress. He has no wheezes. He has no rales.  Skin: There is erythema.  Vitals reviewed.  paronychia on the tip of the left fourth finger on  the ulnar aspect of the fingernail        Assessment & Plan:  Paronychia, left - Plan: doxycycline (VIBRA-TABS) 100 MG tablet   the finger was anesthetized using a digital block with 0.1% lidocaine without epinephrine. A vertical incision  Was made and copious purulent material was drained.. I will start the patient on doxycycline 100 mg by mouth twice a day for 10 days. Recheck on Monday. Hopefully can return to work on Monday. I wound culture was sent.

## 2015-05-28 LAB — CULTURE, ROUTINE-ABSCESS: Gram Stain: NONE SEEN

## 2015-05-29 ENCOUNTER — Telehealth: Payer: Self-pay | Admitting: Family Medicine

## 2015-05-29 ENCOUNTER — Ambulatory Visit: Payer: BLUE CROSS/BLUE SHIELD | Admitting: Family Medicine

## 2015-05-29 NOTE — Telephone Encounter (Signed)
Called to cancel appt to recheck finger today.  Says it is much better.  Difficult for him to get here on night shift.  Told him about culture report and reinforced completing all antibiotics.  Call back if conditions worsens again.

## 2015-05-29 NOTE — Telephone Encounter (Signed)
Ok but he can go back to work.

## 2015-06-20 ENCOUNTER — Ambulatory Visit (INDEPENDENT_AMBULATORY_CARE_PROVIDER_SITE_OTHER): Payer: BLUE CROSS/BLUE SHIELD | Admitting: Family Medicine

## 2015-06-20 ENCOUNTER — Encounter: Payer: Self-pay | Admitting: Family Medicine

## 2015-06-20 VITALS — BP 126/68 | HR 110 | Temp 98.0°F | Resp 22 | Wt 234.0 lb

## 2015-06-20 DIAGNOSIS — J101 Influenza due to other identified influenza virus with other respiratory manifestations: Secondary | ICD-10-CM | POA: Diagnosis not present

## 2015-06-20 MED ORDER — OSELTAMIVIR PHOSPHATE 75 MG PO CAPS: 75.0000 mg | ORAL_CAPSULE | Freq: Two times a day (BID) | ORAL | Status: DC

## 2015-06-20 NOTE — Progress Notes (Signed)
Subjective:    Patient ID: Tim Penton., male    DOB: 09-06-1980, 35 y.o.   MRN: [id number]  HPI   patient developed diffuse body aches on Friday. He also developed a cough productive of green and yellow sputum. The body aches of worsening. He is also developed sore throat. He now aches in his back in his ribs and in his legs. He reports a dull headache and sinus congestion. Symptoms are consistent with viral influenza. Unfortunately we are out of flu test in the office today.. Past Medical History  Diagnosis Date  . Hypertension   . GAD (generalized anxiety disorder)   . PSA (psoriatic arthritis) ([place])   . Cervicalgia   . Lumbago   . Lumbosacral spondylosis without myelopathy   . Thoracic spondylosis without myelopathy   . Myalgia and myositis, unspecified   . Other symptoms referable to back   . Smoker   . Hypertriglyceridemia    Past Surgical History  Procedure Laterality Date  . Rotator cuff repair  2010    right  . Tonsillectomy and adenoidectomy  Age 55   Current Outpatient Prescriptions on File Prior to Visit  Medication Sig Dispense Refill  . albuterol (PROVENTIL HFA;VENTOLIN HFA) 108 (90 BASE) MCG/ACT inhaler Inhale 2 puffs into the lungs every 6 (six) hours as needed for wheezing or shortness of breath. 1 Inhaler 0  . amLODipine (NORVASC) 10 MG tablet Take 1 tablet (10 mg total) by mouth daily. 90 tablet 1  . fenofibrate (TRICOR) 145 MG tablet Take 1 tablet (145 mg total) by mouth daily. 90 tablet 1  . gabapentin (NEURONTIN) 300 MG capsule Take 300 mg by mouth 3 (three) times daily.   2  . loratadine (CLARITIN) 10 MG tablet Take 10 mg by mouth daily.    . meloxicam (MOBIC) 15 MG tablet Take 15 mg by mouth daily.    . Oxycodone HCl 10 MG TABS Take 10 mg by mouth 3 (three) times daily.    Marland Kitchen tiZANidine (ZANAFLEX) 4 MG tablet Take 4 mg by mouth 3 (three) times daily.    . chlorpheniramine-HYDROcodone (TUSSIONEX PENNKINETIC ER) 10-8 MG/5ML SUER Take 5 mLs by mouth  every 12 (twelve) hours as needed for cough. (Patient not taking: Reported on 06/20/2015) 90 mL 0   No current facility-administered medications on file prior to visit.   Allergies  Allergen Reactions  . Ceclor [Cefaclor]   . Sulfa Antibiotics    Social History   Social History  . Marital Status: Single    Spouse Name: N/A  . Number of Children: N/A  . Years of Education: N/A   Occupational History  . Not on file.   Social History Main Topics  . Smoking status: Current Every Day Smoker -- 1.50 packs/day for 10 years    Types: Cigarettes  . Smokeless tobacco: Current User    Types: Snuff  . Alcohol Use: No  . Drug Use: No  . Sexual Activity: Not Currently   Other Topics Concern  . Not on file   Social History Narrative   05/21/2012 AHW [name] was born and grew up in [place], [place]. He reports he had a good childhood. He has one sister who is 3 years younger than he. His parents are still married. He graduated from high school and attended [employer] studying information systems for 2 years but did not turn a degree. In high school he was a member of the [employer]. 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 He shares custody with his son's mother, but the patient keeps his son most of the time. He denies any current legal issues. He reports that his social support system consists of his mother, father, and sister. He currently lives with his family. 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 He reports that he believes in God but he does not practice any religion. 05/21/2012 AHW     Review of Systems  All other systems reviewed and are negative.      Objective:   Physical Exam  Constitutional: He appears well-developed and well-nourished.  HENT:  Right Ear: External ear normal.  Left Ear: External ear normal.  Nose: Mucosal  edema and rhinorrhea present.  Mouth/Throat: Oropharynx is clear and moist. No oropharyngeal exudate.  Eyes: Conjunctivae are normal. Pupils are equal, round, and reactive to light.  Neck: Neck supple.  Cardiovascular: Normal rate, regular rhythm and normal heart sounds.   Pulmonary/Chest: Effort normal and breath sounds normal. No respiratory distress. He has no wheezes. He has no rales.  Lymphadenopathy:    He has no cervical adenopathy.  Vitals reviewed.         Assessment & Plan:  Influenza A - Plan: oseltamivir (TAMIFLU) 75 MG capsule   Clinically, his symptoms are consistent with the flu. I will begin Tamiflu 75 mg by mouth twice a day for 5 days. I do not believe that his symptoms are consistent with a bacterial illness. Recheck later this week if symptoms worsen.

## 2015-06-22 ENCOUNTER — Encounter: Payer: Self-pay | Admitting: Family Medicine

## 2015-06-22 ENCOUNTER — Telehealth: Payer: Self-pay | Admitting: Family Medicine

## 2015-06-22 MED ORDER — BENZONATATE 200 MG PO CAPS: 200.0000 mg | ORAL_CAPSULE | Freq: Three times a day (TID) | ORAL | Status: DC

## 2015-06-22 NOTE — Telephone Encounter (Signed)
He can get tessalon perles 200 q 8 hrs (30)

## 2015-06-22 NOTE — Telephone Encounter (Signed)
Patient is calling to see if he can get cough med called in, he cant stop coughing  [facility]  [phone]

## 2015-06-22 NOTE — Telephone Encounter (Signed)
Medication called/sent to requested pharmacy  

## 2015-06-28 ENCOUNTER — Other Ambulatory Visit: Payer: Self-pay | Admitting: Family Medicine

## 2015-07-27 ENCOUNTER — Telehealth: Payer: Self-pay | Admitting: *Deleted

## 2015-07-27 NOTE — Telephone Encounter (Signed)
Received call from patient.   Reports that he has 3 days of sinus pressure and HA.   Advised to use OTC nasal saline for congestion. Also advised to use Sudafed x3-5 days only for sinus pressure and congestion. Advised to increase rest and to increase fluid intake. Recommended that if symptoms worsen or persist >1 week to contact office for visit.

## 2015-08-14 ENCOUNTER — Other Ambulatory Visit (HOSPITAL_COMMUNITY): Payer: Self-pay | Admitting: Physician Assistant

## 2015-08-14 ENCOUNTER — Telehealth: Payer: Self-pay | Admitting: Family Medicine

## 2015-08-14 DIAGNOSIS — M5136 Other intervertebral disc degeneration, lumbar region: Secondary | ICD-10-CM

## 2015-08-14 NOTE — Telephone Encounter (Signed)
Have no information about paperwork - will need to contact pt as to what this is in reference to, as paperwork is incomplete with no dates or reason pt was out of work.

## 2015-08-14 NOTE — Telephone Encounter (Signed)
Routed disability forms to sandy on 08/14/2015 at 9:24am

## 2015-08-15 ENCOUNTER — Other Ambulatory Visit: Payer: Self-pay | Admitting: Family Medicine

## 2015-08-15 DIAGNOSIS — E785 Hyperlipidemia, unspecified: Secondary | ICD-10-CM

## 2015-08-15 DIAGNOSIS — Z79899 Other long term (current) drug therapy: Secondary | ICD-10-CM

## 2015-08-15 DIAGNOSIS — Z0000 Encounter for general adult medical examination without abnormal findings: Secondary | ICD-10-CM

## 2015-08-15 DIAGNOSIS — I10 Essential (primary) hypertension: Secondary | ICD-10-CM

## 2015-08-15 NOTE — Telephone Encounter (Signed)
Spoke to pt and this is for his ortho md not [place] as the forms are for his hip and back. He is going to have surgery on both but we have no seen him for either of these problems. Will fax forms back with this information.

## 2015-08-16 NOTE — Telephone Encounter (Signed)
Forms Faxed

## 2015-08-22 ENCOUNTER — Ambulatory Visit (HOSPITAL_COMMUNITY): Payer: Self-pay

## 2015-08-25 ENCOUNTER — Encounter: Payer: BLUE CROSS/BLUE SHIELD | Admitting: Family Medicine

## 2015-08-28 ENCOUNTER — Other Ambulatory Visit: Payer: BLUE CROSS/BLUE SHIELD

## 2015-08-28 DIAGNOSIS — Z0000 Encounter for general adult medical examination without abnormal findings: Secondary | ICD-10-CM

## 2015-08-28 DIAGNOSIS — E785 Hyperlipidemia, unspecified: Secondary | ICD-10-CM

## 2015-08-28 DIAGNOSIS — Z79899 Other long term (current) drug therapy: Secondary | ICD-10-CM

## 2015-08-28 DIAGNOSIS — I10 Essential (primary) hypertension: Secondary | ICD-10-CM

## 2015-08-28 LAB — LIPID PANEL
Cholesterol: 169 mg/dL (ref 125–200)
HDL: 35 mg/dL — ABNORMAL LOW (ref >=40)
LDL Cholesterol: 95 mg/dL (ref <130)
Total CHOL/HDL Ratio: 4.8 Ratio (ref <=5.0)
Triglycerides: 193 mg/dL — ABNORMAL HIGH (ref <150)
VLDL: 39 mg/dL — ABNORMAL HIGH (ref <30)

## 2015-08-28 LAB — CBC WITH DIFFERENTIAL/PLATELET
Basophils Absolute: 0 cells/uL (ref 0–200)
Basophils Relative: 0 %
Eosinophils Absolute: 168 cells/uL (ref 15–500)
Eosinophils Relative: 3 %
HCT: 39.5 % (ref 38.5–50.0)
Hemoglobin: 13.4 g/dL (ref 13.0–17.0)
Lymphocytes Relative: 28 %
Lymphs Abs: 1568 cells/uL (ref 850–3900)
MCH: 29.5 pg (ref 27.0–33.0)
MCHC: 33.9 g/dL (ref 32.0–36.0)
MCV: 87 fL (ref 80.0–100.0)
MPV: 9.7 fL (ref 7.5–12.5)
Monocytes Absolute: 392 cells/uL (ref 200–950)
Monocytes Relative: 7 %
Neutro Abs: 3472 cells/uL (ref 1500–7800)
Neutrophils Relative %: 62 %
Platelets: 282 10*3/uL (ref 140–400)
RBC: 4.54 MIL/uL (ref 4.20–5.80)
RDW: 14.2 % (ref 11.0–15.0)
WBC: 5.6 10*3/uL (ref 3.8–10.8)

## 2015-08-28 LAB — COMPREHENSIVE METABOLIC PANEL
ALT: 26 U/L (ref 9–46)
AST: 19 U/L (ref 10–40)
Albumin: 4.3 g/dL (ref 3.6–5.1)
Alkaline Phosphatase: 61 U/L (ref 40–115)
BUN: 18 mg/dL (ref 7–25)
CO2: 21 mmol/L (ref 20–31)
Calcium: 9.4 mg/dL (ref 8.6–10.3)
Chloride: 103 mmol/L (ref 98–110)
Creat: 0.77 mg/dL (ref 0.60–1.35)
Glucose, Bld: 111 mg/dL — ABNORMAL HIGH (ref 70–99)
Potassium: 4.4 mmol/L (ref 3.5–5.3)
Sodium: 133 mmol/L — ABNORMAL LOW (ref 135–146)
Total Bilirubin: 0.3 mg/dL (ref 0.2–1.2)
Total Protein: 6.9 g/dL (ref 6.1–8.1)

## 2015-08-31 ENCOUNTER — Encounter: Payer: Self-pay | Admitting: Family Medicine

## 2015-08-31 ENCOUNTER — Ambulatory Visit (INDEPENDENT_AMBULATORY_CARE_PROVIDER_SITE_OTHER): Payer: BLUE CROSS/BLUE SHIELD | Admitting: Family Medicine

## 2015-08-31 VITALS — BP 162/74 | HR 88 | Temp 98.0°F | Resp 18 | Ht 69.0 in | Wt 253.0 lb

## 2015-08-31 DIAGNOSIS — I10 Essential (primary) hypertension: Secondary | ICD-10-CM

## 2015-08-31 DIAGNOSIS — R7303 Prediabetes: Secondary | ICD-10-CM

## 2015-08-31 DIAGNOSIS — Z0000 Encounter for general adult medical examination without abnormal findings: Secondary | ICD-10-CM | POA: Diagnosis not present

## 2015-08-31 MED ORDER — LOSARTAN POTASSIUM 50 MG PO TABS: 50.0000 mg | ORAL_TABLET | Freq: Every day | ORAL | Status: DC

## 2015-08-31 NOTE — Progress Notes (Signed)
Subjective:    Patient ID: Tim Penton., male    DOB: October 07, 1980, 35 y.o.   MRN: [id number]  HPI Here today for a complete physical exam. He has avascular necrosis of the right femoral head. He is scheduled to have surgery performed later this summer at [facility] where they're going to take a fibular graft and placed that in the femoral head to try to buy 10-15 years prior to hip replacement. At the present time, the patient is out on disability. He is gained significant weight. His blood pressures up to 162/74. His lab work reflects his inactivity as his fasting blood sugars are now borderline prediabetic. Please see below. Appointment on 08/28/2015  Component Date Value Ref Range Status  . WBC 08/28/2015 5.6  3.8 - 10.8 K/uL Final  . RBC 08/28/2015 4.54  4.20 - 5.80 MIL/uL Final  . Hemoglobin 08/28/2015 13.4  13.0 - 17.0 g/dL Final  . HCT 08/28/2015 39.5  38.5 - 50.0 % Final  . MCV 08/28/2015 87.0  80.0 - 100.0 fL Final  . MCH 08/28/2015 29.5  27.0 - 33.0 pg Final  . MCHC 08/28/2015 33.9  32.0 - 36.0 g/dL Final  . RDW 08/28/2015 14.2  11.0 - 15.0 % Final  . Platelets 08/28/2015 282  140 - 400 K/uL Final  . MPV 08/28/2015 9.7  7.5 - 12.5 fL Final  . Neutro Abs 08/28/2015 3472  1500 - 7800 cells/uL Final  . Lymphs Abs 08/28/2015 1568  850 - 3900 cells/uL Final  . Monocytes Absolute 08/28/2015 392  200 - 950 cells/uL Final  . Eosinophils Absolute 08/28/2015 168  15 - 500 cells/uL Final  . Basophils Absolute 08/28/2015 0  0 - 200 cells/uL Final  . Neutrophils Relative % 08/28/2015 62   Final  . Lymphocytes Relative 08/28/2015 28   Final  . Monocytes Relative 08/28/2015 7   Final  . Eosinophils Relative 08/28/2015 3   Final  . Basophils Relative 08/28/2015 0   Final  . Smear Review 08/28/2015 Criteria for review not met   Final   ** Please note change in unit of measure and reference range(s). **  . Sodium 08/28/2015 133* 135 - 146 mmol/L Final  . Potassium 08/28/2015 4.4  3.5 - 5.3  mmol/L Final  . Chloride 08/28/2015 103  98 - 110 mmol/L Final  . CO2 08/28/2015 21  20 - 31 mmol/L Final  . Glucose, Bld 08/28/2015 111* 70 - 99 mg/dL Final  . BUN 08/28/2015 18  7 - 25 mg/dL Final  . Creat 08/28/2015 0.77  0.60 - 1.35 mg/dL Final  . Total Bilirubin 08/28/2015 0.3  0.2 - 1.2 mg/dL Final  . Alkaline Phosphatase 08/28/2015 61  40 - 115 U/L Final  . AST 08/28/2015 19  10 - 40 U/L Final  . ALT 08/28/2015 26  9 - 46 U/L Final  . Total Protein 08/28/2015 6.9  6.1 - 8.1 g/dL Final  . Albumin 08/28/2015 4.3  3.6 - 5.1 g/dL Final  . Calcium 08/28/2015 9.4  8.6 - 10.3 mg/dL Final  . Cholesterol 08/28/2015 169  125 - 200 mg/dL Final  . Triglycerides 08/28/2015 193* <150 mg/dL Final  . HDL 08/28/2015 35* >=40 mg/dL Final  . Total CHOL/HDL Ratio 08/28/2015 4.8  <=5.0 Ratio Final  . VLDL 08/28/2015 39* <30 mg/dL Final  . LDL Cholesterol 08/28/2015 95  <130 mg/dL Final   Comment:   Total Cholesterol/HDL Ratio:CHD Risk  Coronary Heart Disease Risk Table                                        Men       Women          1/2 Average Risk              3.4        3.3              Average Risk              5.0        4.4           2X Average Risk              9.6        7.1           3X Average Risk             23.4       11.0 Use the calculated Patient Ratio above and the CHD Risk table  to determine the patient's CHD Risk.    Past Medical History  Diagnosis Date  . Hypertension   . GAD (generalized anxiety disorder)   . PSA (psoriatic arthritis) ([place])   . Cervicalgia   . Lumbago   . Lumbosacral spondylosis without myelopathy   . Thoracic spondylosis without myelopathy   . Myalgia and myositis, unspecified   . Other symptoms referable to back   . Smoker   . Hypertriglyceridemia    Past Surgical History  Procedure Laterality Date  . Rotator cuff repair  2010    right  . Tonsillectomy and adenoidectomy  Age 42   Current Outpatient Prescriptions on File  Prior to Visit  Medication Sig Dispense Refill  . albuterol (PROVENTIL HFA;VENTOLIN HFA) 108 (90 BASE) MCG/ACT inhaler Inhale 2 puffs into the lungs every 6 (six) hours as needed for wheezing or shortness of breath. 1 Inhaler 0  . amLODipine (NORVASC) 10 MG tablet TAKE 1 TABLET EVERY DAY 90 tablet 0  . chlorpheniramine-HYDROcodone (TUSSIONEX PENNKINETIC ER) 10-8 MG/5ML SUER Take 5 mLs by mouth every 12 (twelve) hours as needed for cough. 90 mL 0  . fenofibrate (TRICOR) 145 MG tablet TAKE 1 TABLET EVERY DAY 90 tablet 0  . gabapentin (NEURONTIN) 300 MG capsule Take 300 mg by mouth 3 (three) times daily.   2  . loratadine (CLARITIN) 10 MG tablet Take 10 mg by mouth daily.    . meloxicam (MOBIC) 15 MG tablet Take 15 mg by mouth daily.    . Oxycodone HCl 10 MG TABS Take 10 mg by mouth 3 (three) times daily.    Marland Kitchen tiZANidine (ZANAFLEX) 4 MG tablet Take 4 mg by mouth 3 (three) times daily.     No current facility-administered medications on file prior to visit.   Allergies  Allergen Reactions  . Ceclor [Cefaclor]   . Sulfa Antibiotics    Social History   Social History  . Marital Status: Single    Spouse Name: N/A  . Number of Children: N/A  . Years of Education: N/A   Occupational History  . Not on file.   Social History Main Topics  . Smoking status: Current Some Day Smoker -- 1.50 packs/day for 10 years    Types: Cigarettes  . Smokeless tobacco: Current User    Types: Snuff  .  Alcohol Use: No  . Drug Use: No  . Sexual Activity: Not Currently   Other Topics Concern  . Not on file   Social History Narrative   05/21/2012 AHW [name] was born and grew up in [place], [place]. He reports he had a good childhood. He has one sister who is 3 years younger than he. His parents are still married. He graduated from high school and attended [employer] studying information systems for 2 years but did not turn a degree. In high school he was a member of the [employer]. 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 He has never been married he has one son who is currently 106 years old. He shares custody with his son's mother, but the patient keeps his son most of the time. He denies any current legal issues. He reports that his social support system consists of his mother, father, and sister. He currently lives with his family. 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 He reports that he believes in God but he does not practice any religion. 05/21/2012 AHW   Family History  Problem Relation Age of Onset  . Hypertension Mother   . Hypertension Father   . Diabetes Father   . COPD Father   . Cancer Paternal Uncle   . Cancer Maternal Grandmother   . Cancer Paternal Grandmother   . ADD / ADHD Son       Review of Systems  All other systems reviewed and are negative.      Objective:   Physical Exam  Constitutional: He is oriented to person, place, and time. He appears well-developed and well-nourished. No distress.  HENT:  Head: Normocephalic and atraumatic.  Right Ear: External ear normal.  Left Ear: External ear normal.  Nose: Nose normal.  Mouth/Throat: Oropharynx is clear and moist. No oropharyngeal exudate.  Eyes: Conjunctivae and EOM are normal. Pupils are equal, round, and reactive to light. Right eye exhibits no discharge. Left eye exhibits no discharge. No scleral icterus.  Neck: Normal range of motion. Neck supple. No JVD present. No tracheal deviation present. No thyromegaly present.  Cardiovascular: Normal rate, regular rhythm, normal heart sounds and intact distal pulses.  Exam reveals no gallop and no friction rub.   No murmur heard. Pulmonary/Chest: Effort normal and breath sounds normal. No stridor. No respiratory distress. He has no wheezes. He has no rales. He exhibits no tenderness.  Abdominal: Soft. Bowel sounds are normal. He exhibits no distension and no mass. There is no tenderness.  There is no rebound and no guarding.  Musculoskeletal:       Right hip: He exhibits decreased range of motion, decreased strength and tenderness.  Lymphadenopathy:    He has no cervical adenopathy.  Neurological: He is alert and oriented to person, place, and time. He has normal reflexes. He displays normal reflexes. No cranial nerve deficit. He exhibits normal muscle tone. Coordination normal.  Skin: Skin is warm. No rash noted. He is not diaphoretic. No erythema. No pallor.  Psychiatric: He has a normal mood and affect. His behavior is normal. Judgment and thought content normal.  Vitals reviewed.         Assessment & Plan:  Benign essential HTN - Plan: losartan (COZAAR) 50 MG tablet  Routine general medical examination at a health care facility  Prediabetes  I strongly recommended smoking cessation. I explained to the patient that if he continues to smoke it will impair healing  of his upcoming hip surgery. His blood pressure is entirely too high. I started the patient on losartan 50 mg in addition to his amlodipine a day and I would like to recheck his blood pressure in one month. Cholesterol is acceptable. He is a prediabetic. We spent 15 minutes discussing low carbohydrate diet and therapeutic lifestyle changes to address this further. I wished him the best of luck on his upcoming surgery

## 2015-09-27 ENCOUNTER — Other Ambulatory Visit: Payer: Self-pay | Admitting: Family Medicine

## 2015-09-28 NOTE — Telephone Encounter (Signed)
Refill appropriate and filled per protocol. 

## 2015-12-26 ENCOUNTER — Other Ambulatory Visit: Payer: Self-pay | Admitting: Family Medicine

## 2016-01-22 ENCOUNTER — Other Ambulatory Visit: Payer: Self-pay | Admitting: Family Medicine

## 2016-01-24 ENCOUNTER — Telehealth: Payer: Self-pay | Admitting: Family Medicine

## 2016-01-24 NOTE — Telephone Encounter (Signed)
Tricor is no longer manufactured.  Please advise??

## 2016-01-25 MED ORDER — FENOFIBRATE 160 MG PO TABS: 160.0000 mg | ORAL_TABLET | Freq: Every day | ORAL | 3 refills | Status: DC

## 2016-01-25 NOTE — Telephone Encounter (Signed)
Switch to fenofibrate 160 po qday.

## 2016-01-25 NOTE — Telephone Encounter (Addendum)
Received PA request on Fenofibrate 160mg .   Medicaid preferred was Tricor, which is not manufactured any longer.   Preferred medications now include: Lopid Trilipix   MD please advise.

## 2016-01-25 NOTE — Telephone Encounter (Signed)
Medication called/sent to requested pharmacy  

## 2016-01-25 NOTE — Telephone Encounter (Signed)
trilipix 135 mg poqday

## 2016-01-26 MED ORDER — CHOLINE FENOFIBRATE 135 MG PO CPDR: 135.0000 mg | DELAYED_RELEASE_CAPSULE | Freq: Every day | ORAL | 3 refills | Status: DC

## 2016-01-26 NOTE — Addendum Note (Signed)
Addended by: Sheral Flow on: 01/26/2016 08:49 AM   Modules accepted: Orders

## 2016-01-26 NOTE — Telephone Encounter (Signed)
Prescription sent to pharmacy.

## 2016-02-13 MED ORDER — GEMFIBROZIL 600 MG PO TABS: 600.0000 mg | ORAL_TABLET | Freq: Two times a day (BID) | ORAL | 3 refills | Status: DC

## 2016-02-13 NOTE — Telephone Encounter (Signed)
Prescription sent to pharmacy.   Call placed to patient and patient made aware per VM. 

## 2016-02-13 NOTE — Telephone Encounter (Signed)
Switch to gemfibrozil 600 mg by mouth twice a day

## 2016-02-13 NOTE — Telephone Encounter (Signed)
Received fax from pharmacy.   Ttrilipix is on back order.   Call placed to pharmacy.   Gemfibrozil 600mg  is available and covered by insurance.   MD please advise.

## 2016-02-13 NOTE — Addendum Note (Signed)
Addended by: Sheral Flow on: 02/13/2016 03:59 PM   Modules accepted: Orders

## 2016-03-05 ENCOUNTER — Telehealth: Payer: Self-pay | Admitting: Family Medicine

## 2016-03-05 MED ORDER — LISINOPRIL 20 MG PO TABS: 20.0000 mg | ORAL_TABLET | Freq: Every day | ORAL | 3 refills | Status: DC

## 2016-03-05 NOTE — Telephone Encounter (Signed)
[facility] requires PA and trial and failure of ace inhibitor - pt has not tried an ace per [facility] must try Lisinopril 20 mg. Pt aware of med change and instructed to call back if questions?

## 2016-04-23 ENCOUNTER — Other Ambulatory Visit: Payer: Self-pay | Admitting: Family Medicine

## 2016-06-03 ENCOUNTER — Encounter: Payer: Self-pay | Admitting: Family Medicine

## 2016-06-05 ENCOUNTER — Other Ambulatory Visit: Payer: Self-pay | Admitting: Family Medicine

## 2016-10-20 ENCOUNTER — Other Ambulatory Visit: Payer: Self-pay | Admitting: Family Medicine

## 2017-01-30 ENCOUNTER — Other Ambulatory Visit: Payer: Self-pay | Admitting: Family Medicine

## 2017-01-30 NOTE — Telephone Encounter (Signed)
Medication refill for one time only.  Patient needs to be seen.  Letter sent for patient to call and schedule 

## 2017-02-24 ENCOUNTER — Other Ambulatory Visit: Payer: Self-pay | Admitting: Family Medicine

## 2017-03-06 ENCOUNTER — Other Ambulatory Visit: Payer: Self-pay | Admitting: Family Medicine

## 2017-03-24 ENCOUNTER — Telehealth: Payer: Self-pay | Admitting: Family Medicine

## 2017-03-24 NOTE — Telephone Encounter (Signed)
Pt needs [facility] [address]'t [facility]. He would like for you to call him if possible.

## 2017-03-26 NOTE — Telephone Encounter (Signed)
Called and spoke to pt's daughter and she thinks he called the wrong doctors office but if he needs to speak to me she will have him call back.

## 2017-04-04 ENCOUNTER — Other Ambulatory Visit: Payer: Self-pay | Admitting: Family Medicine

## 2017-06-02 ENCOUNTER — Ambulatory Visit (INDEPENDENT_AMBULATORY_CARE_PROVIDER_SITE_OTHER): Payer: BLUE CROSS/BLUE SHIELD | Admitting: Family Medicine

## 2017-06-02 VITALS — BP 112/74 | HR 78 | Temp 98.4°F | Resp 18 | Ht 69.0 in | Wt 276.0 lb

## 2017-06-02 DIAGNOSIS — E781 Pure hyperglyceridemia: Secondary | ICD-10-CM

## 2017-06-02 DIAGNOSIS — G4710 Hypersomnia, unspecified: Secondary | ICD-10-CM

## 2017-06-02 DIAGNOSIS — Z0000 Encounter for general adult medical examination without abnormal findings: Secondary | ICD-10-CM | POA: Diagnosis not present

## 2017-06-02 DIAGNOSIS — F17200 Nicotine dependence, unspecified, uncomplicated: Secondary | ICD-10-CM

## 2017-06-02 DIAGNOSIS — R7303 Prediabetes: Secondary | ICD-10-CM

## 2017-06-02 DIAGNOSIS — I10 Essential (primary) hypertension: Secondary | ICD-10-CM

## 2017-06-02 NOTE — Progress Notes (Signed)
Subjective:    Patient ID: Tim [name]., male    DOB: August 12, 1980, 37 y.o.   MRN: [id number]  HPI  2017 Here today for a complete physical exam. Tim [name] has avascular necrosis of the right femoral head. Tim [name] is scheduled to have surgery performed later this summer at [facility] where they're going to take a fibular graft and placed that in the femoral head to try to buy 10-15 years prior to hip replacement. At the present time, the patient is out on disability. Tim [name] is gained significant weight. His blood pressures up to 162/74. His lab work reflects his inactivity as his fasting blood sugars are now borderline prediabetic. At that time, my plan was: I strongly recommended smoking cessation. I explained to the patient that if Tim [name] continues to smoke it will impair healing of his upcoming hip surgery. His blood pressure is entirely too high. I started the patient on losartan 50 mg in addition to his amlodipine a day and I would like to recheck his blood pressure in one month. Cholesterol is acceptable. Tim [name] is a prediabetic. We spent 15 minutes discussing low carbohydrate diet and therapeutic lifestyle changes to address this further. I wished him the best of luck on his upcoming surgery  06/02/17 Has not been seen since.  Here for CPE.  Since I last saw the patient, the planned surgery at [facility], fell through.  Tim [name] has not had any surgery on his right hip.  Tim [name] is currently seeing [facility] who are planning eventually for a total hip replacement but at the present time state that his hip does not warrant a total hip replacement.  Therefore Tim [name] is receiving pain management services.  Tim [name] is currently on oxycodone in addition to muscle relaxers.  This is through [facility].  Tim [name] is also seeing psychiatry who is diagnosed him with bipolar disorder.  They have started him on Seroquel 400 mg at night in addition to trazodone 150 mg at night.  Tim [name] is also taking 2 other medications that Tim [name] does not recall the  name of during the day "to keep him happy".  I am extremely concerned by his polypharmacy.  I spent more than 20 minutes today with the patient discussing the risk of polypharmacy, sedation, and even overdose and death.  Tim [name] continues to smoke and vague.  His exam today is concerning with expiratory wheezing, prolonged expiratory phase, and diminished breath sounds.  Tim [name] declines the flu shot  Past Medical History:  Diagnosis Date  . Avascular necrosis of bone of right hip ([place])   . Cervicalgia   . GAD (generalized anxiety disorder)   . Hypertension   . Hypertriglyceridemia   . Lumbago   . Lumbosacral spondylosis without myelopathy   . Myalgia and myositis, unspecified   . Other symptoms referable to back   . PSA (psoriatic arthritis) ([place])   . Smoker   . Thoracic spondylosis without myelopathy    Past Surgical History:  Procedure Laterality Date  . ROTATOR CUFF REPAIR  2010   right  . TONSILLECTOMY AND ADENOIDECTOMY  Age 71    Allergies  Allergen Reactions  . Ceclor [Cefaclor]   . Sulfa Antibiotics    Social History   Socioeconomic History  . Marital status: Single    Spouse name: Not on file  . Number of children: Not on file  . Years of education: Not on file  . Highest education level: Not on file  Social Needs  . Emergency planning/management officer  strain: Not on file  . Food insecurity - worry: Not on file  . Food insecurity - inability: Not on file  . Transportation needs - medical: Not on file  . Transportation needs - non-medical: Not on file  Occupational History  . Not on file  Tobacco Use  . Smoking status: Current Some Day Smoker    Packs/day: 1.50    Years: 10.00    Pack years: 15.00    Types: Cigarettes  . Smokeless tobacco: Current User    Types: Snuff  Substance and Sexual Activity  . Alcohol use: No    Alcohol/week: 0.0 oz  . Drug use: No  . Sexual activity: Not Currently  Other Topics Concern  . Not on file  Social History Narrative   05/21/2012 Tim [name] Tim [name]  was born and grew up in [place], [place]. Tim [name] reports Tim [name] had a good childhood. Tim [name] has one sister who is 3 years younger than Tim [name]. His parents are still married. Tim [name] graduated from high school and attended [employer] studying information systems for 2 years but did not turn a degree. In high school Tim [name] was a member of the [employer]. 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 Tim [name] shares custody with his son's mother, but the patient keeps his son most of the time. Tim [name] denies any current legal issues. Tim [name] reports that his social support system consists of his mother, father, and sister. Tim [name] currently lives with his family. 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 Tim [name] reports that Tim [name] believes in God but Tim [name] does not practice any religion. 05/21/2012 Tim [name]   Family History  Problem Relation Age of Onset  . Hypertension Mother   . Hypertension Father   . Diabetes Father   . COPD Father   . Cancer Paternal Uncle   . Cancer Maternal Grandmother   . Cancer Paternal Grandmother   . ADD / ADHD Son       Review of Systems  All other systems reviewed and are negative.      Objective:   Physical Exam  Constitutional: Tim [name] is oriented to person, place, and time. Tim [name] appears well-developed and well-nourished. No distress.  HENT:  Head: Normocephalic and atraumatic.  Right Ear: External ear normal.  Left Ear: External ear normal.  Nose: Nose normal.  Mouth/Throat: Oropharynx is clear and moist. No oropharyngeal exudate.  Eyes: Conjunctivae and EOM are normal. Pupils are equal, round, and reactive to light. Right eye exhibits no discharge. Left eye exhibits no discharge. No scleral icterus.  Neck: Normal range of motion. Neck supple. No JVD present. No tracheal deviation present. No thyromegaly present.  Cardiovascular: Normal rate, regular rhythm, normal heart sounds and  intact distal pulses. Exam reveals no gallop and no friction rub.  No murmur heard. Pulmonary/Chest: Effort normal. No stridor. No respiratory distress. Tim [name] has wheezes. Tim [name] has no rales. Tim [name] exhibits no tenderness.  Abdominal: Soft. Bowel sounds are normal. Tim [name] exhibits no distension and no mass. There is no tenderness. There is no rebound and no guarding.  Musculoskeletal:       Right hip: Tim [name] exhibits decreased range of motion, decreased strength and tenderness.  Lymphadenopathy:    Tim [name] has no cervical adenopathy.  Neurological: Tim [name] is alert and oriented to person, place, and time. Tim [name] has normal reflexes. No cranial nerve deficit. Tim [name] exhibits normal muscle tone. Coordination normal.  Skin: Skin is warm. No rash noted. Tim [name] is not diaphoretic. No erythema. No pallor.  Psychiatric: Tim [name] has a normal mood and affect. His behavior is normal. Judgment and thought content normal.  Vitals reviewed.         Assessment & Plan:  Benign essential HTN  Routine general medical examination at a health care facility - Plan: CBC with Differential/Platelet, COMPLETE METABOLIC PANEL WITH GFR, Lipid panel  Prediabetes - Plan: CBC with Differential/Platelet, COMPLETE METABOLIC PANEL WITH GFR, Lipid panel, Hemoglobin A1c  Hypertriglyceridemia - Plan: CBC with Differential/Platelet, COMPLETE METABOLIC PANEL WITH GFR, Lipid panel  Smoker  Primary concern is the patient's smoking.  I am very concerned that the patient may be developing emphysema.  Strongly recommended against future smoking and strongly encouraged smoking cessation.  Second concern is the patient's weight.  Tim [name] was a prediabetic when I last saw him.  Since that time Tim [name] is started Seroquel and his weight has elevated.  I am concerned that Tim [name] may be a borderline diabetic.  Therefore I will check a CMP along with a hemoglobin A1c.  Third concern is his dyslipidemia and hypertriglyceridemia.  I will check a fasting lipid panel.  Fourth concern is  obstructive sleep apnea.  Patient states that Tim [name] snores loudly.  Family members have witnessed apneic spells.  Tim [name] is on numerous sedative medications at night.  Tim [name] reports hypersomnolence during the day.  Therefore I recommended a referral to a sleep specialist for a split level sleep study.  Lastly I am concerned about polypharmacy.  I recommended gradually weaning himself away from oxycodone and muscle relaxers as much as possible due to the sedative properties and his likely underlying sleep apnea and emphysema.

## 2017-06-03 LAB — COMPLETE METABOLIC PANEL WITH GFR
AG Ratio: 1.6 (calc) (ref 1.0–2.5)
ALT: 28 U/L (ref 9–46)
AST: 21 U/L (ref 10–40)
Albumin: 4.3 g/dL (ref 3.6–5.1)
Alkaline phosphatase (APISO): 46 U/L (ref 40–115)
BUN/Creatinine Ratio: 11 (calc) (ref 6–22)
BUN: 16 mg/dL (ref 7–25)
CO2: 31 mmol/L (ref 20–32)
Calcium: 9.7 mg/dL (ref 8.6–10.3)
Chloride: 101 mmol/L (ref 98–110)
Creat: 1.4 mg/dL — ABNORMAL HIGH (ref 0.60–1.35)
GFR, Est African American: 75 mL/min/{1.73_m2} (ref >=60)
GFR, Est Non African American: 65 mL/min/{1.73_m2} (ref >=60)
Globulin: 2.7 g/dL (calc) (ref 1.9–3.7)
Glucose, Bld: 83 mg/dL (ref 65–99)
Potassium: 4.6 mmol/L (ref 3.5–5.3)
Sodium: 135 mmol/L (ref 135–146)
Total Bilirubin: 0.4 mg/dL (ref 0.2–1.2)
Total Protein: 7 g/dL (ref 6.1–8.1)

## 2017-06-03 LAB — CBC WITH DIFFERENTIAL/PLATELET
Basophils Absolute: 18 cells/uL (ref 0–200)
Basophils Relative: 0.3 %
Eosinophils Absolute: 112 cells/uL (ref 15–500)
Eosinophils Relative: 1.9 %
HCT: 34.2 % — ABNORMAL LOW (ref 38.5–50.0)
Hemoglobin: 11.7 g/dL — ABNORMAL LOW (ref 13.2–17.1)
Lymphs Abs: 1587 cells/uL (ref 850–3900)
MCH: 29.3 pg (ref 27.0–33.0)
MCHC: 34.2 g/dL (ref 32.0–36.0)
MCV: 85.7 fL (ref 80.0–100.0)
MPV: 11.7 fL (ref 7.5–12.5)
Monocytes Relative: 6.1 %
Neutro Abs: 3823 cells/uL (ref 1500–7800)
Neutrophils Relative %: 64.8 %
Platelets: 210 10*3/uL (ref 140–400)
RBC: 3.99 10*6/uL — ABNORMAL LOW (ref 4.20–5.80)
RDW: 12.7 % (ref 11.0–15.0)
Total Lymphocyte: 26.9 %
WBC mixed population: 360 cells/uL (ref 200–950)
WBC: 5.9 10*3/uL (ref 3.8–10.8)

## 2017-06-03 LAB — LIPID PANEL
Cholesterol: 146 mg/dL (ref <200)
HDL: 24 mg/dL — ABNORMAL LOW (ref >40)
LDL Cholesterol (Calc): 91 mg/dL (calc)
Non-HDL Cholesterol (Calc): 122 mg/dL (calc) (ref <130)
Total CHOL/HDL Ratio: 6.1 (calc) — ABNORMAL HIGH (ref <5.0)
Triglycerides: 217 mg/dL — ABNORMAL HIGH (ref <150)

## 2017-06-03 LAB — HEMOGLOBIN A1C
Hgb A1c MFr Bld: 5.8 % of total Hgb — ABNORMAL HIGH (ref <5.7)
Mean Plasma Glucose: 120 (calc)
eAG (mmol/L): 6.6 (calc)

## 2017-06-04 ENCOUNTER — Telehealth: Payer: Self-pay | Admitting: Family Medicine

## 2017-06-04 NOTE — Telephone Encounter (Signed)
Pt called and gave the names of the 2 meds psych gave him - Med list updated. He also states that he does not have Amlodipine in his pill basket and was wondering if he should be taking that along with the Lisinopril as he has not been taking that at all????

## 2017-06-05 ENCOUNTER — Other Ambulatory Visit: Payer: Self-pay | Admitting: Family Medicine

## 2017-06-05 NOTE — Telephone Encounter (Signed)
Pt aware of recommendations

## 2017-06-05 NOTE — Telephone Encounter (Signed)
No BP was great at ov.  I removed amlodipine from med list.

## 2017-06-26 ENCOUNTER — Institutional Professional Consult (permissible substitution): Payer: BLUE CROSS/BLUE SHIELD | Admitting: Neurology

## 2017-06-26 ENCOUNTER — Telehealth: Payer: Self-pay | Admitting: Neurology

## 2017-06-26 NOTE — Telephone Encounter (Signed)
Pt called and stated that he would not make todays apt

## 2017-06-27 ENCOUNTER — Encounter: Payer: Self-pay | Admitting: Neurology

## 2017-07-28 ENCOUNTER — Encounter: Payer: Self-pay | Admitting: Physician Assistant

## 2017-07-28 ENCOUNTER — Ambulatory Visit: Payer: BLUE CROSS/BLUE SHIELD | Admitting: Physician Assistant

## 2017-07-28 VITALS — BP 104/64 | HR 68 | Temp 98.2°F | Resp 16 | Ht 69.0 in | Wt 273.6 lb

## 2017-07-28 DIAGNOSIS — F17200 Nicotine dependence, unspecified, uncomplicated: Secondary | ICD-10-CM

## 2017-07-28 DIAGNOSIS — J988 Other specified respiratory disorders: Secondary | ICD-10-CM | POA: Diagnosis not present

## 2017-07-28 DIAGNOSIS — J209 Acute bronchitis, unspecified: Secondary | ICD-10-CM | POA: Diagnosis not present

## 2017-07-28 DIAGNOSIS — B9689 Other specified bacterial agents as the cause of diseases classified elsewhere: Secondary | ICD-10-CM | POA: Diagnosis not present

## 2017-07-28 MED ORDER — ALBUTEROL SULFATE HFA 108 (90 BASE) MCG/ACT IN AERS
2.0000 | INHALATION_SPRAY | Freq: Four times a day (QID) | RESPIRATORY_TRACT | 2 refills | Status: DC | PRN
Start: 2017-07-28 — End: 2017-10-20

## 2017-07-28 MED ORDER — PREDNISONE 20 MG PO TABS: 20.0000 mg | ORAL_TABLET | Freq: Every day | ORAL | 0 refills | Status: DC

## 2017-07-28 MED ORDER — AZITHROMYCIN 250 MG PO TABS: ORAL_TABLET | ORAL | 0 refills | Status: DC

## 2017-07-28 NOTE — Progress Notes (Addendum)
Patient ID: Tim [name]. MRN: [id number], DOB: November 06, 1980, 37 y.o. Date of Encounter: 07/28/2017, 11:43 AM    Chief Complaint:  Chief Complaint  Patient presents with  . Nasal Congestion    for past week      HPI: 37 y.o. year old male he has been having a lot of congestion in his chest as well as his nose.  Says that he just took a hot shower and that seemed to clear up a lot of phlegm in his chest.  Says that he could feel a lot of phlegm and congestion in his chest.  He has been blowing green mucus out of his nose and coughing up brown to yellow phlegm.  He is a smoker and also uses vape. Asked if he is works as far as needing any note to cover her being out of work.  States that he is on disability.     Home Meds:   Outpatient Medications Prior to Visit  Medication Sig Dispense Refill  . fenofibrate (TRICOR) 145 MG tablet TAKE 1 TABLET BY MOUTH EVERY DAY 90 tablet 1  . fluvoxaMINE (LUVOX) 100 MG tablet Take 100 mg by mouth at bedtime.    . gabapentin (NEURONTIN) 300 MG capsule Take 300 mg by mouth 3 (three) times daily.   2  . lisinopril (PRINIVIL,ZESTRIL) 20 MG tablet TAKE 1 TABLET BY MOUTH EVERY DAY 90 tablet 1  . loratadine (CLARITIN) 10 MG tablet Take 10 mg by mouth daily.    . meloxicam (MOBIC) 15 MG tablet Take 15 mg by mouth daily.    . Oxycodone HCl 10 MG TABS Take 10 mg by mouth 3 (three) times daily.    . QUEtiapine (SEROQUEL) 400 MG tablet Take 400 mg by mouth at bedtime.    Marland Kitchen tiZANidine (ZANAFLEX) 4 MG tablet Take 4 mg by mouth 3 (three) times daily.    . traZODone (DESYREL) 150 MG tablet Take by mouth at bedtime.    . divalproex (DEPAKOTE ER) 500 MG 24 hr tablet Take 500 mg by mouth at bedtime.     No facility-administered medications prior to visit.     Allergies:  Allergies  Allergen Reactions  . Ceclor [Cefaclor]   . Sulfa Antibiotics       Review of Systems: See HPI for pertinent ROS. All other ROS negative.    Physical Exam: Blood pressure  104/64, pulse 68, temperature 98.2 F (36.8 C), temperature source Oral, resp. rate 16, height 5\' 9"  (1.753 m), weight 124.1 kg (273 lb 9.6 oz), SpO2 95 %., Body mass index is 40.4 kg/m. General: Obese white male. appears in no acute distress. HEENT: Normocephalic, atraumatic, eyes without discharge, sclera non-icteric, nares are without discharge. Bilateral auditory canals clear, TM's are without perforation, pearly grey and translucent with reflective cone of light bilaterally. Oral cavity moist, posterior pharynx without exudate, erythema, peritonsillar abscess.  No tenderness with percussion to frontal or maxillary sinuses bilaterally.  Neck: Supple. No thyromegaly. No lymphadenopathy. Lungs: Very minimal wheeze heard on exam at this time scattered throughout bilaterally. Heart: Regular rhythm. No murmurs, rubs, or gallops. Msk:  Strength and tone normal for age. Extremities/Skin: Warm and dry.  Neuro: Alert and oriented X 3. Moves all extremities spontaneously. Gait is normal. CNII-XII grossly in tact. Psych:  Responds to questions appropriately with a normal affect.     ASSESSMENT AND PLAN:  37 y.o. year old male with  1. Bacterial respiratory infection Discussed that I am hearing just  very minimal wheeze on exam at this time and asked if he has been feeling/hearing wheezing at home.  He reports that yes he has been having wheezing.  Says things are calmed down right now because he just took that hot shower and got a lot of phlegm out and settled. - azithromycin (ZITHROMAX) 250 MG tablet; Day 1: Take 2 daily.  Days 2-5: Take 1 daily.  Dispense: 6 tablet; Refill: 0  2. Smoker  3. Acute bronchitis, unspecified organism Discussed that I am hearing just very minimal wheeze on exam at this time and asked if he has been feeling/hearing wheezing at home.  He reports that yes he has been having wheezing.  Says things are calmed down right now because he just took that hot shower and got a lot  of phlegm out and settled. Take the azithromycin and the prednisone as directed.  Also use the albuterol as directed/as needed.  Follow-up if symptoms do not resolve within 1 week after completion of antibiotic. - azithromycin (ZITHROMAX) 250 MG tablet; Day 1: Take 2 daily.  Days 2-5: Take 1 daily.  Dispense: 6 tablet; Refill: 0 - albuterol (PROVENTIL HFA;VENTOLIN HFA) 108 (90 Base) MCG/ACT inhaler; Inhale 2 puffs into the lungs every 6 (six) hours as needed for wheezing or shortness of breath.  Dispense: 1 Inhaler; Refill: 2 - predniSONE (DELTASONE) 20 MG tablet; Take 1 tablet (20 mg total) by mouth daily with breakfast.  Dispense: 5 tablet; Refill: 0   Signed, [address] [place], [place], [facility] 07/28/2017 11:43 AM

## 2017-08-14 ENCOUNTER — Ambulatory Visit: Payer: BLUE CROSS/BLUE SHIELD | Admitting: Family Medicine

## 2017-08-18 ENCOUNTER — Ambulatory Visit: Payer: BLUE CROSS/BLUE SHIELD | Admitting: Family Medicine

## 2017-08-18 VITALS — BP 126/60 | HR 118 | Temp 97.9°F | Resp 22 | Ht 68.0 in | Wt 273.0 lb

## 2017-08-18 DIAGNOSIS — J441 Chronic obstructive pulmonary disease with (acute) exacerbation: Secondary | ICD-10-CM | POA: Diagnosis not present

## 2017-08-18 MED ORDER — PREDNISONE 20 MG PO TABS: ORAL_TABLET | ORAL | 0 refills | Status: DC

## 2017-08-18 MED ORDER — LEVOFLOXACIN 500 MG PO TABS: 500.0000 mg | ORAL_TABLET | Freq: Every day | ORAL | 0 refills | Status: DC

## 2017-08-18 NOTE — Progress Notes (Signed)
Subjective:    Patient ID: Tim [name]., male    DOB: 1980-11-08, 37 y.o.   MRN: [id number]  HPI  As stated in his last office note, I feel the patient is developing underlying copd.  Patient was recently seen by my partner and was diagnosed with an upper respiratory infection was placed on Z-Pak along with a prednisone taper pack and albuterol.  He is getting worse although he continues to smoke.  Pulse oximetry is 90% on room air today.  He has left basilar crackles.  He has rhonchorous breath sounds throughout.  He has diffuse expiratory wheezing.  He reports shortness of breath with activity.  He reports constant cough and subjective fevers.  Continues to smoke 1 pack of cigarettes per day Past Medical History:  Diagnosis Date  . Avascular necrosis of bone of right hip ([place])   . Cervicalgia   . GAD (generalized anxiety disorder)   . Hypertension   . Hypertriglyceridemia   . Lumbago   . Lumbosacral spondylosis without myelopathy   . Myalgia and myositis, unspecified   . Other symptoms referable to back   . PSA (psoriatic arthritis) ([place])   . Smoker   . Thoracic spondylosis without myelopathy    Past Surgical History:  Procedure Laterality Date  . ROTATOR CUFF REPAIR  2010   right  . TONSILLECTOMY AND ADENOIDECTOMY  Age 30    Allergies  Allergen Reactions  . Ceclor [Cefaclor]   . Sulfa Antibiotics    Social History   Socioeconomic History  . Marital status: Single    Spouse name: Not on file  . Number of children: Not on file  . Years of education: Not on file  . Highest education level: Not on file  Occupational History  . Not on file  Social Needs  . Financial resource strain: Not on file  . Food insecurity:    Worry: Not on file    Inability: Not on file  . Transportation needs:    Medical: Not on file    Non-medical: Not on file  Tobacco Use  . Smoking status: Current Some Day Smoker    Packs/day: 1.50    Years: 10.00    Pack years: 15.00    Types:  Cigarettes  . Smokeless tobacco: Current User    Types: Snuff  Substance and Sexual Activity  . Alcohol use: No    Alcohol/week: 0.0 oz  . Drug use: No  . Sexual activity: Not Currently  Lifestyle  . Physical activity:    Days per week: Not on file    Minutes per session: Not on file  . Stress: Not on file  Relationships  . Social connections:    Talks on phone: Not on file    Gets together: Not on file    Attends religious service: Not on file    Active member of club or organization: Not on file    Attends meetings of clubs or organizations: Not on file    Relationship status: Not on file  . Intimate partner violence:    Fear of current or ex partner: Not on file    Emotionally abused: Not on file    Physically abused: Not on file    Forced sexual activity: Not on file  Other Topics Concern  . Not on file  Social History Narrative   05/21/2012 [name] was born and grew up in [place], [place]. He reports he had a good childhood. He has one  sister who is 3 years younger than he. His parents are still married. He graduated from high school and attended [employer] studying information systems for 2 years but did not turn a degree. In high school he was a member of the [employer]. 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 He has never been married he has one son who is currently 34 years old. He shares custody with his son's mother, but the patient keeps his son most of the time. He denies any current legal issues. He reports that his social support system consists of his mother, father, and sister. He currently lives with his family. 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 He reports that he believes in God but he does not practice any religion. 05/21/2012 AHW   Family History  Problem Relation Age of Onset  . Hypertension Mother   . Hypertension Father   . Diabetes Father   . COPD Father   . Cancer  Paternal Uncle   . Cancer Maternal Grandmother   . Cancer Paternal Grandmother   . ADD / ADHD Son       Review of Systems  All other systems reviewed and are negative.      Objective:   Physical Exam  Constitutional: He appears well-developed and well-nourished. No distress.  HENT:  Head: Normocephalic and atraumatic.  Right Ear: External ear normal.  Left Ear: External ear normal.  Nose: Nose normal.  Mouth/Throat: Oropharynx is clear and moist. No oropharyngeal exudate.  Eyes: Pupils are equal, round, and reactive to light. Conjunctivae and EOM are normal. Right eye exhibits no discharge. Left eye exhibits no discharge. No scleral icterus.  Cardiovascular: Normal rate, regular rhythm, normal heart sounds and intact distal pulses. Exam reveals no gallop and no friction rub.  No murmur heard. Pulmonary/Chest: Effort normal. No respiratory distress. He has wheezes in the right upper field, the right lower field, the left upper field and the left lower field. He has rales in the left lower field.     He exhibits no tenderness.  Lymphadenopathy:    He has no cervical adenopathy.  Skin: He is not diaphoretic.  Vitals reviewed.         Assessment & Plan:  COPD exacerbation ([place]) - Plan: predniSONE (DELTASONE) 20 MG tablet, levofloxacin (LEVAQUIN) 500 MG tablet  I believe the patient is having a COPD exacerbation although there is no definitive diagnosis of COPD on his chart.  I will keep the patient on a prednisone taper pack.  I will extend his antibiotics to a respiratory fluoroquinolone, Levaquin, 500 mg p.o. daily for 7 days.  Use albuterol 2 puffs inhaled every 4-6 hours for wheezing.  Recommended smoking cessation.  Recheck in 48 hours if no better or sooner if worse.  I would recommend pulmonary function test when the patient is stable to determine the severity of his COPD.  If worsening, he is to go to the emergency room possible walking pneumonia

## 2017-08-18 NOTE — Addendum Note (Signed)
Addended by: Shary Decamp B on: 08/18/2017 03:37 PM   Modules accepted: Orders

## 2017-08-24 ENCOUNTER — Other Ambulatory Visit: Payer: Self-pay | Admitting: Family Medicine

## 2017-09-02 ENCOUNTER — Institutional Professional Consult (permissible substitution): Payer: BLUE CROSS/BLUE SHIELD | Admitting: Neurology

## 2017-09-11 ENCOUNTER — Encounter: Payer: Self-pay | Admitting: Neurology

## 2017-09-11 ENCOUNTER — Ambulatory Visit: Payer: BLUE CROSS/BLUE SHIELD | Admitting: Neurology

## 2017-09-11 VITALS — BP 129/82 | HR 60 | Ht 68.0 in | Wt 277.0 lb

## 2017-09-11 DIAGNOSIS — M87052 Idiopathic aseptic necrosis of left femur: Secondary | ICD-10-CM | POA: Diagnosis not present

## 2017-09-11 DIAGNOSIS — R0601 Orthopnea: Secondary | ICD-10-CM

## 2017-09-11 DIAGNOSIS — F3132 Bipolar disorder, current episode depressed, moderate: Secondary | ICD-10-CM

## 2017-09-11 DIAGNOSIS — R0683 Snoring: Secondary | ICD-10-CM

## 2017-09-11 DIAGNOSIS — G4710 Hypersomnia, unspecified: Secondary | ICD-10-CM

## 2017-09-11 DIAGNOSIS — M87051 Idiopathic aseptic necrosis of right femur: Secondary | ICD-10-CM

## 2017-09-11 DIAGNOSIS — R0681 Apnea, not elsewhere classified: Secondary | ICD-10-CM

## 2017-09-11 DIAGNOSIS — R05 Cough: Secondary | ICD-10-CM | POA: Diagnosis not present

## 2017-09-11 DIAGNOSIS — J449 Chronic obstructive pulmonary disease, unspecified: Secondary | ICD-10-CM

## 2017-09-11 DIAGNOSIS — G4730 Sleep apnea, unspecified: Secondary | ICD-10-CM | POA: Diagnosis not present

## 2017-09-11 DIAGNOSIS — R059 Cough, unspecified: Secondary | ICD-10-CM

## 2017-09-11 DIAGNOSIS — K219 Gastro-esophageal reflux disease without esophagitis: Secondary | ICD-10-CM | POA: Diagnosis not present

## 2017-09-11 NOTE — Progress Notes (Signed)
SLEEP MEDICINE CLINIC   Provider:  Larey Seat, [place] D  Primary Care Physician:  Susy Frizzle, MD   Referring Provider: Susy Frizzle, MD    Chief Complaint  Patient presents with  . New Patient (Initial Visit)    pt alone, rm 11. pt is strong smoker and his PCP thinks he has pulmonary issues, he can be short of breath. PCP referred for sleep study to rule out apnea. he wakes up gasping for air, pt has been told he snores and stops breathing.     HPI:  Tim [name]. is a 37 y.o. male , seen here in a referral  from Dr. Dennard Schaumann for a sleep evaluation.  Chief complaint according to patient : "I sweat at night, can be short of breath, wheezing , and wake gasping for air ".  Mr. Grillo is a 37 year old Caucasian right-handed male patient with a history of depression-possible bipolar disorder, a vascular necrosis beginning of the right hip but now affecting the left, delayed surgical evaluation which led to him not being a surgical case anymore.  Some cervicalgia and degenerative disc disease, hypertension, hypertriglyceridemia, psoriatic arthritis, ongoing smoker with stage I emphysema, asthmatic wheezing and by now morbid obesity.  He has been in treatment for pain control and he follows with psychiatry is currently taking Seroquel and trazodone to help him sleep at night.  Sleep habits are as follows: takes night time medication at 6 Pm, and is in bed by 8.30- his son follows at 9 PM. He is asleep within 15 minutes,  But he watches TV in his bedroom. Bedroom is cool, but not quiet. Uses a box fan, adjustable bed- raises his chest  for support. Severe GERD, acid reflux- also waking him.  Has a bathroom break every  2-3 hours  and he coughs a lot. This fragments his sleep. He has lost bladder control. Coughing has caused him to wet the bed, but often he wakes up in a wet bed and dreamt he was awake. Enuresis happens in the first 2 hours of sleep.  He goes back to sleep. He  rises at  5 AM to take his morning medication and his son ready for the school bus by 6.30 AM. He drinks a red bull for breakfast. He naps in daytime, after lunch, often 3-4 hours.    Sleep medical history and family sleep history: his medical history is described above. He has insomnia, bed wetting,  shortness of breath, sinuitis, bronchitis, post  nasal drip  Father has OSA, CPAP user. Paternal grandfather died at age 88 after a stroke, was alcoholic and " very mean " . Paternal Uncle with bipolar disease. Grandmother was ' peculiar" .  Social history: he is disabled, used to work in Marine scientist . He lives with his son 46 years old, Hetty Blend, at his parental home with both parents, his sister helps out a lot, lives with them and works from home. She is the main child care provider for [place], supervises homework , etc. He smokes- 1/2 ppd- smoked up to 3 ppd in the past, . He no longer drinks regularly, but used to drink heavily. He uses pain medications and muscle relaxants. Caffeine use; drinks sweet tea 1 liter a day, drinks a red bull for breakfast. May be one cup of coffee. He is not on good terms with his son's mother.     Review of Systems: Out of a complete 14 system review, the patient complains of  only the following symptoms, and all other reviewed systems are negative. D GERD, weight gain, poor ambulation, chronic hip pain , DDD, followed by pain management. Smoker, caffeine user,  Sleep hygiene. Eats irregular. Eats at night- one meal. Coughing, enuresis, bipolar with anxiety insomnia, depression.  Epworth score 16/ 24 -  , Fatigue severity score 48  , depression score N/A    Social History   Socioeconomic History  . Marital status: Single    Spouse name: Not on file  . Number of children: Not on file  . Years of education: Not on file  . Highest education level: Not on file  Occupational History  . Not on file  Social Needs  . Financial resource strain: Not on file  . Food  insecurity:    Worry: Not on file    Inability: Not on file  . Transportation needs:    Medical: Not on file    Non-medical: Not on file  Tobacco Use  . Smoking status: Current Every Day Smoker    Packs/day: 1.50    Years: 10.00    Pack years: 15.00    Types: Cigarettes  . Smokeless tobacco: Never Used  Substance and Sexual Activity  . Alcohol use: Yes    Alcohol/week: 0.0 oz    Comment: occasional once a month  . Drug use: No  . Sexual activity: Not Currently  Lifestyle  . Physical activity:    Days per week: Not on file    Minutes per session: Not on file  . Stress: Not on file  Relationships  . Social connections:    Talks on phone: Not on file    Gets together: Not on file    Attends religious service: Not on file    Active member of club or organization: Not on file    Attends meetings of clubs or organizations: Not on file    Relationship status: Not on file  . Intimate partner violence:    Fear of current or ex partner: Not on file    Emotionally abused: Not on file    Physically abused: Not on file    Forced sexual activity: Not on file  Other Topics Concern  . Not on file  Social History Narrative   05/21/2012 AHW [name] was born and grew up in [place], [place]. He reports he had a good childhood. He has one sister who is 3 years younger than he. His parents are still married. He graduated from high school and attended [employer] studying information systems for 2 years but did not turn a degree. In high school he was a member of the [employer]. 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 He shares custody with his son's mother, but the patient keeps his son most of the time. He denies any current legal issues. He reports that his social support system consists of his mother, father, and sister. He currently lives with his family. 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 He reports that he believes in God but he does not practice any religion. 05/21/2012 AHW    Family History  Problem Relation Age of Onset  . Hypertension Mother   . Hypertension Father   . Diabetes Father   . COPD Father   . Cancer Paternal Uncle   . Cancer Maternal Grandmother   . Cancer Paternal Grandmother   .  ADD / ADHD Son     Past Medical History:  Diagnosis Date  . Avascular necrosis of bone of right hip ([place])   . Cervicalgia   . GAD (generalized anxiety disorder)   . Hypertension   . Hypertriglyceridemia   . Lumbago   . Lumbosacral spondylosis without myelopathy   . Myalgia and myositis, unspecified   . Other symptoms referable to back   . PSA (psoriatic arthritis) ([place])   . Smoker   . Thoracic spondylosis without myelopathy     Past Surgical History:  Procedure Laterality Date  . DISTAL CLAVICLE EXCISION Right 2017  . ROTATOR CUFF REPAIR  2010   right  . TONSILLECTOMY AND ADENOIDECTOMY  Age 89    Current Outpatient Medications  Medication Sig Dispense Refill  . albuterol (PROVENTIL HFA;VENTOLIN HFA) 108 (90 Base) MCG/ACT inhaler Inhale 2 puffs into the lungs every 6 (six) hours as needed for wheezing or shortness of breath. 1 Inhaler 2  . clonazePAM (KLONOPIN) 1 MG tablet TAKE 1 TABLET BY MOUTH TWICE A DAY AS NEEDED FOR ANXIETY  5  . divalproex (DEPAKOTE) 500 MG DR tablet Take 500 mg by mouth at bedtime.    . fenofibrate (TRICOR) 145 MG tablet TAKE 1 TABLET BY MOUTH EVERY DAY 90 tablet 1  . fluvoxaMINE (LUVOX) 100 MG tablet Take 100 mg by mouth at bedtime.    . gabapentin (NEURONTIN) 300 MG capsule Take 300 mg by mouth 3 (three) times daily.   2  . lisinopril (PRINIVIL,ZESTRIL) 20 MG tablet TAKE 1 TABLET BY MOUTH EVERY DAY 90 tablet 3  . loratadine (CLARITIN) 10 MG tablet Take 10 mg by mouth daily.    . meloxicam (MOBIC) 15 MG tablet Take 15 mg by mouth daily.    . Oxycodone HCl 10 MG TABS Take 10 mg by  mouth 3 (three) times daily.    . QUEtiapine (SEROQUEL) 400 MG tablet Take 400 mg by mouth at bedtime.    Marland Kitchen tiZANidine (ZANAFLEX) 4 MG tablet Take 4 mg by mouth 3 (three) times daily.    . traZODone (DESYREL) 150 MG tablet Take by mouth at bedtime.     No current facility-administered medications for this visit.     Allergies as of 09/11/2017 - Review Complete 09/11/2017  Allergen Reaction Noted  . Ceclor [cefaclor]  10/18/2010  . Sulfa antibiotics  10/18/2010    Vitals: BP 129/82   Pulse 60   Ht 5\' 8"  (1.727 m)   Wt 277 lb (125.6 kg)   BMI 42.12 kg/m  Last Weight:  Wt Readings from Last 1 Encounters:  09/11/17 277 lb (125.6 kg)   KDT:OIZT mass index is 42.12 kg/m.     Last Height:   Ht Readings from Last 1 Encounters:  09/11/17 5\' 8"  (1.727 m)    Physical exam:  General: The patient is awake, alert and appears not in acute distress. The patient is not groomed. Head: Normocephalic, atraumatic. Facial hair, raspy voice, Neck is supple. Mallampati 4 , poor dental status.  neck circumference:19.5 ".  Nasal airflow patent , crowded lower dentition,.  Cardiovascular:  Regular rate and rhythm , without  murmurs or carotid bruit, and without distended neck veins. Respiratory: Lungs are clear to auscultation. Skin:  Without evidence of edema, or rash Trunk: BMI is 42.12 . The patient's posture is hunched.   Neurologic exam : The patient is drowsy , oriented to place and time.   Memory subjective  described as intact.  Cranial nerves: Pupils are equal and briskly reactive to light. Funduscopic exam without  evidence of pallor or edema. Extraocular movements  in vertical and horizontal planes intact and without nystagmus. Visual fields by finger perimetry are intact. Hearing to finger rub intact.   Facial sensation intact to fine touch.  Facial motor strength is symmetric and tongue and uvula move midline. Shoulder shrug was symmetrical.   Motor exam:   Normal tone, muscle  bulk and symmetric strength in all extremities. Sensory:  Deferred.  Coordination: Rapid alternating movements / Finger-to-nose maneuver normal without evidence of ataxia, dysmetria or tremor. Gait and station: Patient walks without assistive device , hunched, stooped posture with arthralgic gait. Stance is stable and normal. Tandem gait deferred . Turns with 5 Steps. Romberg testing is negative. Deep tendon reflexes: in the upper and lower extremities are symmetric and intact.   Assessment:  After physical and neurologic examination, review of laboratory studies,  Personal review of imaging studies, reports of other /same  Imaging studies, results of polysomnography and / or neurophysiology testing and pre-existing records as far as provided in visit., my assessment is   1)Sleep fragmentation: coughing, enuresis, shortness of breath, palpitations and diaphoretic spells as well as acid reflux interrupt his sleep .  Apnea can be caused by acid reflux, and hypoxemia /hypercpnia risk.   A)OSA- There is a multitude of risk factors for apnea, overlapping with COPD and obesity related hypoventilation, chronic bronchitis.  BMI, neck size and Mallampati.   B)Central sleep apnea risk due narcotics, muscle relaxants , sleep aids, psychotropic medications.   2) EDS- can be also related to 400 mg Seroquel, narcotic pain medication and bipolar depression.   3) enuresis may be related to medication- enuresis.    The patient was advised of the nature of the diagnosed disorder , the treatment options and the  risks for general health and wellness arising from not treating the condition.   I spent more than 60 minutes of face to face time with the patient.  Greater than 50% of time was spent in counseling and coordination of care. We have discussed the diagnosis and differential and I answered the patient's questions.    Plan:  Treatment plan and additional workup :  Attended Sleep study,  SPLIT at [facility]  40/h , 4 %.   CO2 for the first half of the study, peak in REM , NREM and time above 50 Torr.    Bring your night time medication.  Adhere to sleep hygiene. Get all electronics out of your bed room.  We will order a waterproof insert for the bed linen.    Larey Seat, MD 10/24/3149, 7:61 PM  Certified in Neurology by ABPN Certified in [place] by [facility] [address], [place], [place] [address]

## 2017-10-02 ENCOUNTER — Ambulatory Visit (INDEPENDENT_AMBULATORY_CARE_PROVIDER_SITE_OTHER): Payer: BLUE CROSS/BLUE SHIELD | Admitting: Neurology

## 2017-10-02 DIAGNOSIS — M87051 Idiopathic aseptic necrosis of right femur: Secondary | ICD-10-CM

## 2017-10-02 DIAGNOSIS — M87052 Idiopathic aseptic necrosis of left femur: Secondary | ICD-10-CM

## 2017-10-02 DIAGNOSIS — R0683 Snoring: Secondary | ICD-10-CM

## 2017-10-02 DIAGNOSIS — F3132 Bipolar disorder, current episode depressed, moderate: Secondary | ICD-10-CM

## 2017-10-02 DIAGNOSIS — R0681 Apnea, not elsewhere classified: Secondary | ICD-10-CM

## 2017-10-02 DIAGNOSIS — J449 Chronic obstructive pulmonary disease, unspecified: Secondary | ICD-10-CM

## 2017-10-02 DIAGNOSIS — G4730 Sleep apnea, unspecified: Secondary | ICD-10-CM

## 2017-10-02 DIAGNOSIS — R059 Cough, unspecified: Secondary | ICD-10-CM

## 2017-10-02 DIAGNOSIS — R0601 Orthopnea: Secondary | ICD-10-CM

## 2017-10-02 DIAGNOSIS — K219 Gastro-esophageal reflux disease without esophagitis: Secondary | ICD-10-CM

## 2017-10-02 DIAGNOSIS — G4710 Hypersomnia, unspecified: Secondary | ICD-10-CM | POA: Diagnosis not present

## 2017-10-02 DIAGNOSIS — R05 Cough: Secondary | ICD-10-CM

## 2017-10-03 ENCOUNTER — Other Ambulatory Visit: Payer: Self-pay | Admitting: Family Medicine

## 2017-10-06 NOTE — Procedures (Signed)
PATIENT'S NAME:  Tim [name], Tim [name] DOB:      1981-03-09      MR#:    [id number]     DATE OF RECORDING: 10/02/2017 REFERRING M.D.:  Jenna Luo, M.D. Study Performed:   Baseline Polysomnogram HISTORY: Tim [name]. is a 37 y.o. male patient with this Chief Complaint: "I sweat at night, can be short of breath, often wheezing, and I wake gasping for air ".  Tim [name] is a 37 year old Caucasian right-handed male patient with a history of depression-possible bipolar disorder, further has pain due to avascular necrosis of the right hip but now also affecting the left, cervicalgia and degenerative disc disease were listed, hypertension, hypertriglyceridemia, psoriatic arthritis, and him being an ongoing smoker with stage I emphysema, having asthmatic wheezing, and by now being morbidly obese.  He has been in specialist treatment for pain control and he follows with psychiatry; is currently taking Seroquel and trazodone to help him sleep at night.   Sleep habits are as follows: takes night time medication at 6 Pm, and is in bed by 8.30- his son follows at 9 PM. He is asleep within 15 minutes, but he watches TV in his bedroom. The bedroom is cool, but not quiet. Uses a box fan. Sleep in an adjustable bed- and raises his chest for breathing support, also controlling severe GERD, acid reflux- and these events are also waking him. Has a bathroom break every 2-3 hours and he coughs a lot. This fragments his sleep. He has lost bladder control. Coughing has caused him to wet the bed, but often he wakes up in a wet bed and dreamt he was awake. Enuresis happens in the first 2 hours of sleep. He goes back to sleep. He rises at 5 AM to take his morning medication and his son ready for the school bus by 6.30 AM. He drinks a Red Bull for breakfast. He naps in daytime, after lunch, often 3-4 hours.  The patient's weight 277 pounds with a height of 68 (inches), resulting in a BMI of 42.1 kg/m2.The patient's neck circumference  measured 19.5 inches.  The patient endorsed the Epworth Sleepiness Scale at 16/24 points.     CURRENT MEDICATIONS: Albuterol, Klonopin, Depakote, Tricor, Luvox, Neurontin, Prinivil, Claritin, Mobic, Oxycodone, Seroquel, Zanaflex, Desyrel.   PROCEDURE:  This is a multichannel digital polysomnogram utilizing the Somnostar 11.2 system.  Electrodes and sensors were applied and monitored per AASM Specifications.   EEG, EOG, Chin and Limb EMG, were sampled at 200 Hz.  ECG, Snore and Nasal Pressure, Thermal Airflow, Respiratory Effort, CPAP Flow and Pressure, Oximetry was sampled at 50 Hz. Digital video and audio were recorded.      BASELINE STUDY: Lights Out was at 20:57 and Lights On at 05:06.  Total recording time (TRT) was 489.5 minutes, with a total sleep time (TST) of 439 minutes.   The patient's sleep latency was 34.5 minutes.  REM latency was 277 minutes.  The sleep efficiency was 89.7%.     SLEEP ARCHITECTURE: WASO (Wake after sleep onset) was 15.5 minutes.  There were 4 minutes in Stage N1, 346 minutes Stage N2, 50.5 minutes Stage N3 and 38.5 minutes in Stage REM.  The percentage of Stage N1 was 0.9%, Stage N2 was 78.8%, Stage N3 was 11.5% and Stage R (REM sleep) was 8.8%.    RESPIRATORY ANALYSIS:  There were a total of 14 respiratory events:  0 obstructive apneas, 7 central apneas and 0 mixed apneas with a total of 7 apneas  and an apnea index (AI) of 1.0 /hour. There were 7 hypopneas with a hypopnea index of 1.0 /hour. The patient also had 0 respiratory event related arousals (RERAs). The total APNEA/HYPOPNEA INDEX (AHI) was 1.90/hour and the total RESPIRATORY DISTURBANCE INDEX was 1.9 /hour.  4 events occurred in REM sleep and 16 events in NREM. The REM AHI was 6.2 /hour, versus a non-REM AHI of 1.5. The patient spent 178 minutes of total sleep time in the supine position and 261 minutes in non-supine. The supine AHI was 1.3 versus a non-supine AHI of 2.3.  OXYGEN SATURATION & C02:  The Wake  baseline 02 saturation was 94%, with the lowest being 81%. Time spent below 89% saturation equaled 392 minutes. End Tidal CO2 during sleep was 51.0 torr.  During NREM, the average End Tidal CO2 during sleep was 51.0 torr.  Total sleep time greater than 50 torr was 45.80 minutes.   PERIODIC LIMB MOVEMENTS:  The patient had a total of 10 Periodic Limb Movements.  The Periodic Limb Movement (PLM) index was 1.4 and the PLM Arousal index was 0.4/hour. The arousals were noted as: 20 were spontaneous, 3 were associated with PLMs, and 5 were associated with respiratory events. Audio and video analysis did show behaviors, phonations and vocalizations. The patient moaned, snores very loud and whines in his NREM 3 sleep.  The patient took one bathroom break. Loud Snoring was noted. EKG was in keeping with normal sinus rhythm (NSR). Post-study, the patient indicated that sleep was the same as usual.   IMPRESSION:  1. No evidence of clinically significant Obstructive Sleep Apnea(OSA) 2. No evidence of clinically significant Periodic Limb Movement Disorder (PLMD) 3. Loud Snoring 4. Prolonged Hypoxemia and hypercapnia, would qualify for oxygen supplementation.    RECOMMENDATIONS:  1. Advise weight loss, complete treatment of bronchitis and acid reflux, and full-night oxygen supplementation to optimize therapy. I may have to consult with a pulmonologist to set up oxygen at home. 2. The sleep efficiency was actually very good- there may be an entrained sleep perception of Insomnia. 3. The patient was actively dreaming and vocalizing within the first 2 hours of sleep, correlating with the time of reported enuresis.   I certify that I have reviewed the entire raw data recording prior to the issuance of this report in accordance with the Standards of Accreditation of the [place] [employer] (AASM)      [x] Larey Seat, MD  10-05-2017 Diplomat, American Board of Psychiatry and Neurology   Diplomat, American Board of Sleep Medicine Medical Director, [facility]

## 2017-10-06 NOTE — Addendum Note (Signed)
Addended by: Larey Seat on: 10/06/2017 09:04 AM   Modules accepted: Orders

## 2017-10-07 ENCOUNTER — Telehealth: Payer: Self-pay | Admitting: Neurology

## 2017-10-07 NOTE — Telephone Encounter (Signed)
-----   Message from Larey Seat, MD sent at 10/06/2017  9:04 AM EDT ----- IMPRESSION:  1. No evidence of clinically significant Obstructive Sleep  Apnea(OSA) 2. No evidence of clinically significant Periodic Limb Movement  Disorder (PLMD) 3. Loud Snoring 4. Prolonged Hypoxemia and hypercapnia, would qualify for oxygen  supplementation.    RECOMMENDATIONS:  1. Advise weight loss, complete treatment of bronchitis and acid  reflux, and full-night oxygen supplementation to optimize  therapy. I may have to consult with a pulmonologist to set up  oxygen at home. 2. The sleep efficiency was actually very good- there may be an  entrained sleep perception of Insomnia. 3. The patient was actively dreaming and vocalizing within the  first 2 hours of sleep, correlating with the time of reported  enuresis.

## 2017-10-07 NOTE — Telephone Encounter (Signed)
Called patient to discuss sleep study results. No answer at this time. LVM for the patient to call back.   

## 2017-10-09 NOTE — Telephone Encounter (Signed)
Called the patient and reviewed the sleep study results. The patient was instructed to follow up with a pulmonologist to evaluate his oxygen levels and possible treatment with nocturnal oxygen. I encouraged weight loss for the pt to also help him with his breathing. Order for a pulmonology referral was placed and informed the patient he will get a call to get scheduled with them. Pt verbalized understanding.

## 2017-10-09 NOTE — Telephone Encounter (Signed)
Pt has called back to speak with RN Myriam Jacobson, please call

## 2017-10-20 ENCOUNTER — Other Ambulatory Visit: Payer: Self-pay | Admitting: Family Medicine

## 2017-10-20 DIAGNOSIS — J209 Acute bronchitis, unspecified: Secondary | ICD-10-CM

## 2017-10-20 MED ORDER — ALBUTEROL SULFATE HFA 108 (90 BASE) MCG/ACT IN AERS
2.0000 | INHALATION_SPRAY | Freq: Four times a day (QID) | RESPIRATORY_TRACT | 2 refills | Status: DC | PRN
Start: 2017-10-20 — End: 2017-12-23

## 2017-10-22 ENCOUNTER — Ambulatory Visit (INDEPENDENT_AMBULATORY_CARE_PROVIDER_SITE_OTHER)
Admission: RE | Admit: 2017-10-22 | Discharge: 2017-10-22 | Disposition: A | Discharge status: Home / Self Care | Payer: BLUE CROSS/BLUE SHIELD | Source: Ambulatory Visit | Attending: Pulmonary Disease | Admitting: Pulmonary Disease

## 2017-10-22 ENCOUNTER — Encounter: Payer: Self-pay | Admitting: Pulmonary Disease

## 2017-10-22 ENCOUNTER — Ambulatory Visit: Payer: BLUE CROSS/BLUE SHIELD | Admitting: Pulmonary Disease

## 2017-10-22 VITALS — BP 128/86 | HR 75 | Ht 68.0 in | Wt 275.5 lb

## 2017-10-22 DIAGNOSIS — R0602 Shortness of breath: Secondary | ICD-10-CM

## 2017-10-22 DIAGNOSIS — G4734 Idiopathic sleep related nonobstructive alveolar hypoventilation: Secondary | ICD-10-CM

## 2017-10-22 DIAGNOSIS — J4489 Other specified chronic obstructive pulmonary disease: Secondary | ICD-10-CM

## 2017-10-22 DIAGNOSIS — J449 Chronic obstructive pulmonary disease, unspecified: Secondary | ICD-10-CM | POA: Diagnosis not present

## 2017-10-22 MED ORDER — FLUTICASONE FUROATE-VILANTEROL 100-25 MCG/INH IN AEPB: 1.0000 | INHALATION_SPRAY | Freq: Every day | RESPIRATORY_TRACT | 0 refills | Status: DC

## 2017-10-22 NOTE — Progress Notes (Signed)
Subjective:    Patient ID: Tim Penton., male    DOB: 1980/07/07, 37 y.o.   MRN: [id number]  HPI Chief Complaint  Patient presents with  . Sleep Consult    Referred by Dr. Brett Fairy for low O2 during sleep test. Per patient, he does suffer from short term memory loss and wonders if the low O2 at night has anything to do with it.     37 year old obese man referred by neurology after sleep study for evaluation of nocturnal hypoxemia and hypercarbia.  He has bipolar disorder and anxiety and is on multiple medications for this including Seroquel, valproate fluvoxamine and clonazepam.  He takes trazodone for insomnia.  He has chronic low back pain and bilateral avascular necrosis and is maintained on oxycodone twice daily and gabapentin.  He reports a history of "asthma" without any clear triggers, did not needed inhalers as a young adult but has started on albuterol over the past 2 years He had some more short-term memory issues and loud snoring and underwent sleep evaluation by neurology. Overnight polysomnogram was obtained which surprisingly did not show significant hyper apneas or hypopneas, however it showed that oxygen level was less than 88% for 392 minutes with lowest being around 80% also there is evidence of hypoventilation with hypercarbia in the 51 range end-tidal CO2.  Hence he is referred.  Epworth sleepiness score is 9 and he reports sleepiness while watching TV, as a passenger in a car or lying down to rest in the afternoons.  Bedtime is between 9 PM and 11 PM, sleep latency can be about 30 minutes, he said he sleeps on his side with one pillow but has 3 other pillows arranged around him, reports non-refreshing sleep, 1-2 nocturnal awakenings including nocturia and is out of bed by 7 AM feeling rested with dryness of mouth but denies headaches. He has gained 50 pounds in the last 2 years since he went on disability. There is no history suggestive of cataplexy, sleep paralysis  or parasomnias   He smokes about a pack and a half a day-since age 37, more than 20 pack years.  He was waiting for 6 months and was able to drop down to 5 cigarettes a day but then relapsed again. He drinks few beers socially. He lives with his son who has ADHD and his sister.  His family history of emphysema and asthma in his dad no history of cancer on both sides of the family  Significant tests/ events reviewed  Spirometry showed ratio of 67, FEV1 of 48% and FVC of 58% consistent with moderate to severe airway obstruction.  Past Medical History:  Diagnosis Date  . Avascular necrosis of bone of right hip ([place])   . Cervicalgia   . GAD (generalized anxiety disorder)   . Hypertension   . Hypertriglyceridemia   . Lumbago   . Lumbosacral spondylosis without myelopathy   . Myalgia and myositis, unspecified   . Other symptoms referable to back   . PSA (psoriatic arthritis) ([place])   . Smoker   . Thoracic spondylosis without myelopathy     Past Surgical History:  Procedure Laterality Date  . DISTAL CLAVICLE EXCISION Right 2017  . ROTATOR CUFF REPAIR  2010   right  . TONSILLECTOMY AND ADENOIDECTOMY  Age 37    Allergies  Allergen Reactions  . Ceclor [Cefaclor]   . Sulfa Antibiotics      Social History   Socioeconomic History  . Marital status: Single  Spouse name: Not on file  . Number of children: Not on file  . Years of education: Not on file  . Highest education level: Not on file  Occupational History  . Not on file  Social Needs  . Financial resource strain: Not on file  . Food insecurity:    Worry: Not on file    Inability: Not on file  . Transportation needs:    Medical: Not on file    Non-medical: Not on file  Tobacco Use  . Smoking status: Current Every Day Smoker    Packs/day: 1.50    Years: 10.00    Pack years: 15.00    Types: Cigarettes  . Smokeless tobacco: Never Used  Substance and Sexual Activity  . Alcohol use: Yes    Alcohol/week: 0.0 oz     Comment: occasional once a month  . Drug use: No  . Sexual activity: Not Currently  Lifestyle  . Physical activity:    Days per week: Not on file    Minutes per session: Not on file  . Stress: Not on file  Relationships  . Social connections:    Talks on phone: Not on file    Gets together: Not on file    Attends religious service: Not on file    Active member of club or organization: Not on file    Attends meetings of clubs or organizations: Not on file    Relationship status: Not on file  . Intimate partner violence:    Fear of current or ex partner: Not on file    Emotionally abused: Not on file    Physically abused: Not on file    Forced sexual activity: Not on file  Other Topics Concern  . Not on file  Social History Narrative   05/21/2012 [name] was born and grew up in [place], [place]. He reports he had a good childhood. He has one sister who is 3 years younger than he. His parents are still married. He graduated from high school and attended [employer] studying information systems for 2 years but did not turn a degree. In high school he was a member of the [employer]. 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 He has never been married he has one son who is currently 12 years old. He shares custody with his son's mother, but the patient keeps his son most of the time. He denies any current legal issues. He reports that his social support system consists of his mother, father, and sister. He currently lives with his family. 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 He reports that he believes in God but he does not practice any religion. 05/21/2012 AHW           Review of Systems Positive for shortness of breath with activity, productive cough, acid heartburn, tooth problems, anxiety and depression.   Constitutional: negative for anorexia, fevers and sweats  Eyes: negative for irritation,  redness and visual disturbance  Ears, nose, mouth, throat, and face: negative for earaches, epistaxis, nasal congestion and sore throat  Respiratory: negative for cough, sputum and wheezing  Cardiovascular: negative for chest pain, lower extremity edema, orthopnea, palpitations and syncope  Gastrointestinal: negative for abdominal pain, constipation, diarrhea, melena, nausea and vomiting  Genitourinary:negative for dysuria, frequency and hematuria  Hematologic/lymphatic: negative for bleeding, easy bruising and lymphadenopathy  Musculoskeletal:negative for arthralgias, muscle weakness and stiff joints  Neurological: negative for coordination problems, gait problems,  headaches and weakness  Endocrine: negative for diabetic symptoms including polydipsia, polyuria and weight loss       Objective:   Physical Exam  Gen. Pleasant, obese, in no distress, normal affect ENT -missing upper teeth, no post nasal drip, class 2 airway Neck: No JVD, no thyromegaly, no carotid bruits Lungs: no use of accessory muscles, no dullness to percussion, decreased without rales or rhonchi  Cardiovascular: Rhythm regular, heart sounds  normal, no murmurs or gallops, no peripheral edema Abdomen: soft and non-tender, no hepatosplenomegaly, BS normal. Musculoskeletal: No deformities, no cyanosis or clubbing Neuro:  alert, non focal, no tremors        Assessment & Plan:

## 2017-10-22 NOTE — Assessment & Plan Note (Signed)
We discussed smoking cessation is the most important intervention He will try vaping again and decrease his nicotine to 3 mg and see if he can cut down on his cigarettes. Not a candidate for Chantix

## 2017-10-22 NOTE — Assessment & Plan Note (Addendum)
Trial of BREO once daily -rinse mouth after use, Call for prescription if this works.  Use albuterol 2 puffs every 6 hours as needed for shortness of breath or wheezing.  Schedule spirometry pre-and post in 1 month to check for reversibility since he also gives in a history of asthma  You have to work on quitting cigarettes!

## 2017-10-22 NOTE — Patient Instructions (Signed)
Trial of BREO once daily -rinse mouth after use, Call for prescription if this works.  Use albuterol 2 puffs every 6 hours as needed for shortness of breath or wheezing.  Schedule spirometry pre-and post in 1 month.  You have to work on quitting cigarettes!  We may have to put you on nocturnal oxygen

## 2017-10-22 NOTE — Assessment & Plan Note (Signed)
His nocturnal hypoxia and hypoventilation was probably related to obstructive airways disease but possibly also related to medications especially narcotics He feels that he cannot cut down on the dose currently. We will reassess with nocturnal oximetry once his lung function is optimized, and if required placement of nocturnal oxygen

## 2017-10-24 ENCOUNTER — Telehealth: Payer: Self-pay | Admitting: Pulmonary Disease

## 2017-10-24 NOTE — Telephone Encounter (Signed)
Notes recorded by Rigoberto Noel, MD on 10/23/2017 at 3:39 PM EDT Clear lungs  Spoke with patient. He is aware of results. Nothing else needed at time of call.

## 2017-11-20 ENCOUNTER — Ambulatory Visit: Payer: Self-pay | Admitting: Pulmonary Disease

## 2017-11-28 NOTE — Progress Notes (Signed)
I thank Dr Elsworth Soho for the  assessment and plan as directed .The patient is known to me .   Timtohy Broski, MD

## 2017-12-10 DIAGNOSIS — Z0271 Encounter for disability determination: Secondary | ICD-10-CM

## 2017-12-23 ENCOUNTER — Other Ambulatory Visit: Payer: Self-pay | Admitting: Family Medicine

## 2017-12-23 DIAGNOSIS — J209 Acute bronchitis, unspecified: Secondary | ICD-10-CM

## 2017-12-23 MED ORDER — ALBUTEROL SULFATE HFA 108 (90 BASE) MCG/ACT IN AERS: 2.0000 | INHALATION_SPRAY | Freq: Four times a day (QID) | RESPIRATORY_TRACT | 2 refills | Status: DC | PRN

## 2018-01-02 ENCOUNTER — Other Ambulatory Visit: Payer: Self-pay | Admitting: Family Medicine

## 2018-01-02 ENCOUNTER — Other Ambulatory Visit: Payer: BLUE CROSS/BLUE SHIELD

## 2018-01-02 DIAGNOSIS — R17 Unspecified jaundice: Secondary | ICD-10-CM

## 2018-01-03 LAB — COMPLETE METABOLIC PANEL WITH GFR
AG Ratio: 1.3 (calc) (ref 1.0–2.5)
ALT: 37 U/L (ref 9–46)
AST: 31 U/L (ref 10–40)
Albumin: 4.2 g/dL (ref 3.6–5.1)
Alkaline phosphatase (APISO): 53 U/L (ref 40–115)
BUN/Creatinine Ratio: 17 (calc) (ref 6–22)
BUN: 38 mg/dL — ABNORMAL HIGH (ref 7–25)
CO2: 25 mmol/L (ref 20–32)
Calcium: 10 mg/dL (ref 8.6–10.3)
Chloride: 98 mmol/L (ref 98–110)
Creat: 2.2 mg/dL — ABNORMAL HIGH (ref 0.60–1.35)
GFR, Est African American: 43 mL/min/{1.73_m2} — ABNORMAL LOW (ref >=60)
GFR, Est Non African American: 37 mL/min/{1.73_m2} — ABNORMAL LOW (ref >=60)
Globulin: 3.2 g/dL (calc) (ref 1.9–3.7)
Glucose, Bld: 107 mg/dL — ABNORMAL HIGH (ref 65–99)
Potassium: 4.6 mmol/L (ref 3.5–5.3)
Sodium: 134 mmol/L — ABNORMAL LOW (ref 135–146)
Total Bilirubin: 0.5 mg/dL (ref 0.2–1.2)
Total Protein: 7.4 g/dL (ref 6.1–8.1)

## 2018-01-06 ENCOUNTER — Encounter: Payer: Self-pay | Admitting: Family Medicine

## 2018-01-06 ENCOUNTER — Ambulatory Visit: Payer: BLUE CROSS/BLUE SHIELD | Admitting: Family Medicine

## 2018-01-06 VITALS — BP 130/84 | HR 92 | Temp 98.1°F | Resp 18 | Ht 68.0 in | Wt 266.0 lb

## 2018-01-06 DIAGNOSIS — N289 Disorder of kidney and ureter, unspecified: Secondary | ICD-10-CM | POA: Diagnosis not present

## 2018-01-06 NOTE — Progress Notes (Signed)
Subjective:    Patient ID: Tim [name]., male    DOB: 12/18/1980, 37 y.o.   MRN: [id number]  HPI  Patient was recently at my office with his father.  I was very concerned about him at that time.  He seemed to be slurring his speech.  He appeared overmedicated.  Also his skin color "did not look good".  He looks slightly jaundiced.  Therefore I recommended a CMP.  The results of his lab work thankfully did not show any signs of liver dysfunction however it did show signs of acute renal insufficiency: Appointment on 01/02/2018  Component Date Value Ref Range Status  . Glucose, Bld 01/02/2018 107* 65 - 99 mg/dL Final   Comment: .            Fasting reference interval . For someone without known diabetes, a glucose value between 100 and 125 mg/dL is consistent with prediabetes and should be confirmed with a follow-up test. .   . BUN 01/02/2018 38* 7 - 25 mg/dL Final  . Creat 01/02/2018 2.20* 0.60 - 1.35 mg/dL Final  . GFR, Est Non African American 01/02/2018 37* > OR = 60 mL/min/1.43m2 Final  . GFR, Est African American 01/02/2018 43* > OR = 60 mL/min/1.51m2 Final  . BUN/Creatinine Ratio 01/02/2018 17  6 - 22 (calc) Final  . Sodium 01/02/2018 134* 135 - 146 mmol/L Final  . Potassium 01/02/2018 4.6  3.5 - 5.3 mmol/L Final  . Chloride 01/02/2018 98  98 - 110 mmol/L Final  . CO2 01/02/2018 25  20 - 32 mmol/L Final  . Calcium 01/02/2018 10.0  8.6 - 10.3 mg/dL Final  . Total Protein 01/02/2018 7.4  6.1 - 8.1 g/dL Final  . Albumin 01/02/2018 4.2  3.6 - 5.1 g/dL Final  . Globulin 01/02/2018 3.2  1.9 - 3.7 g/dL (calc) Final  . AG Ratio 01/02/2018 1.3  1.0 - 2.5 (calc) Final  . Total Bilirubin 01/02/2018 0.5  0.2 - 1.2 mg/dL Final  . Alkaline phosphatase (APISO) 01/02/2018 53  40 - 115 U/L Final  . AST 01/02/2018 31  10 - 40 U/L Final  . ALT 01/02/2018 37  9 - 46 U/L Final   Patient's most recent creatinine was 1.45 in February.  Since that time he has been taking meloxicam every day  in addition to his lisinopril for hypertension.  He also recently has been taking 600 mg of ibuprofen 3 times a day after having dental surgery.  He was also taking Tylenol 3's couple with oxycodone coupled with muscle relaxers coupled with Klonopin for his chronic pain along with his underlying psychiatric issues.  He currently sees a psychiatrist in addition to a pain management doctor.  At his last visit with his father, I bluntly told the patient that he was overmedicated.  The oxycodone was coming from his orthopedist.  He was taking Tylenol 3's under the care of a dentist.  He was taking Klonopin under the care of a psychiatrist in addition to his baseline medication.  I believe that the additive effect of all these medication were affecting his baseline mental status, causing him to act "slowed."  Past Medical History:  Diagnosis Date  . Avascular necrosis of bone of right hip ([place])   . Cervicalgia   . GAD (generalized anxiety disorder)   . Hypertension   . Hypertriglyceridemia   . Lumbago   . Lumbosacral spondylosis without myelopathy   . Myalgia and myositis, unspecified   . Other symptoms  referable to back   . PSA (psoriatic arthritis) ([place])   . Smoker   . Thoracic spondylosis without myelopathy    Past Surgical History:  Procedure Laterality Date  . DISTAL CLAVICLE EXCISION Right 2017  . ROTATOR CUFF REPAIR  2010   right  . TONSILLECTOMY AND ADENOIDECTOMY  Age 89    Allergies  Allergen Reactions  . Ceclor [Cefaclor]   . Sulfa Antibiotics    Social History   Socioeconomic History  . Marital status: Single    Spouse name: Not on file  . Number of children: Not on file  . Years of education: Not on file  . Highest education level: Not on file  Occupational History  . Not on file  Social Needs  . Financial resource strain: Not on file  . Food insecurity:    Worry: Not on file    Inability: Not on file  . Transportation needs:    Medical: Not on file     Non-medical: Not on file  Tobacco Use  . Smoking status: Current Every Day Smoker    Packs/day: 1.50    Years: 10.00    Pack years: 15.00    Types: Cigarettes  . Smokeless tobacco: Never Used  Substance and Sexual Activity  . Alcohol use: Yes    Alcohol/week: 0.0 standard drinks    Comment: occasional once a month  . Drug use: No  . Sexual activity: Not Currently  Lifestyle  . Physical activity:    Days per week: Not on file    Minutes per session: Not on file  . Stress: Not on file  Relationships  . Social connections:    Talks on phone: Not on file    Gets together: Not on file    Attends religious service: Not on file    Active member of club or organization: Not on file    Attends meetings of clubs or organizations: Not on file    Relationship status: Not on file  . Intimate partner violence:    Fear of current or ex partner: Not on file    Emotionally abused: Not on file    Physically abused: Not on file    Forced sexual activity: Not on file  Other Topics Concern  . Not on file  Social History Narrative   05/21/2012 AHW [name] was born and grew up in [place], [place]. He reports he had a good childhood. He has one sister who is 3 years younger than he. His parents are still married. He graduated from high school and attended [employer] studying information systems for 2 years but did not turn a degree. In high school he was a member of the [employer]. 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 He has never been married he has one son who is currently 25 years old. He shares custody with his son's mother, but the patient keeps his son most of the time. He denies any current legal issues. He reports that his social support system consists of his mother, father, and sister. He currently lives with his family. 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 He reports that he believes in God but he does not  practice any religion. 05/21/2012 AHW   Family History  Problem Relation Age of Onset  . Hypertension Mother   . Hypertension Father   . Diabetes Father   . COPD Father   . Cancer Paternal Uncle   . Cancer  Maternal Grandmother   . Cancer Paternal Grandmother   . ADD / ADHD Son       Review of Systems  All other systems reviewed and are negative.      Objective:   Physical Exam  Constitutional: He appears well-developed and well-nourished. No distress.  HENT:  Head: Normocephalic and atraumatic.  Mouth/Throat: No oropharyngeal exudate.  Eyes: Pupils are equal, round, and reactive to light. Conjunctivae and EOM are normal. Right eye exhibits no discharge. Left eye exhibits no discharge. No scleral icterus.  Cardiovascular: Normal rate, regular rhythm, normal heart sounds and intact distal pulses. Exam reveals no gallop and no friction rub.  No murmur heard. Pulmonary/Chest: Effort normal. No respiratory distress. He has no wheezes. He has no rales. He exhibits no tenderness.  Abdominal: Soft. Bowel sounds are normal.  Lymphadenopathy:    He has no cervical adenopathy.  Skin: He is not diaphoretic.  Vitals reviewed.         Assessment & Plan:  Acute renal insufficiency - Plan: BASIC METABOLIC PANEL WITH GFR, Urinalysis, Routine w reflex microscopic I have recommended that the patient discontinue meloxicam indefinitely.  I recommended he discontinue ibuprofen immediately.  I recommended that he push fluids.  We will continue lisinopril at the present time and I would like the patient to return in 1 week and repeat a BMP.  If his renal function has not improved, I will also check a urinalysis and a renal ultrasound however I believe that he is currently experiencing acute on chronic underlying renal insufficiency secondary to NSAID toxicity coupled with his ACE inhibitor.  Also recommended that the patient discuss with each of his doctors the medications they are prescribing.   Previously he was overmedicated due to a combination of medications.  I explained to him the risk of this and recommended that he discuss with his doctors medications that he could wean down on so that he is taking less cumulatively.

## 2018-01-14 ENCOUNTER — Other Ambulatory Visit: Payer: BLUE CROSS/BLUE SHIELD

## 2018-01-14 DIAGNOSIS — N289 Disorder of kidney and ureter, unspecified: Secondary | ICD-10-CM

## 2018-01-14 LAB — BASIC METABOLIC PANEL
BUN: 13 mg/dL (ref 7–25)
CO2: 19 mmol/L — ABNORMAL LOW (ref 20–32)
Calcium: 9.4 mg/dL (ref 8.6–10.3)
Chloride: 103 mmol/L (ref 98–110)
Creat: 1.13 mg/dL (ref 0.60–1.35)
Glucose, Bld: 124 mg/dL — ABNORMAL HIGH (ref 65–99)
Potassium: 4.2 mmol/L (ref 3.5–5.3)
Sodium: 135 mmol/L (ref 135–146)

## 2018-03-05 ENCOUNTER — Ambulatory Visit (INDEPENDENT_AMBULATORY_CARE_PROVIDER_SITE_OTHER): Payer: BLUE CROSS/BLUE SHIELD

## 2018-03-05 DIAGNOSIS — Z23 Encounter for immunization: Secondary | ICD-10-CM | POA: Diagnosis not present

## 2018-03-05 NOTE — Progress Notes (Signed)
Patient was in office for flu vaccine patient received vaccine in his right deltoid.Patient tolerated well

## 2018-07-19 ENCOUNTER — Other Ambulatory Visit: Payer: Self-pay | Admitting: Family Medicine

## 2018-07-22 ENCOUNTER — Other Ambulatory Visit: Payer: Self-pay | Admitting: Family Medicine

## 2018-07-22 MED ORDER — FENOFIBRATE 145 MG PO TABS: 145.0000 mg | ORAL_TABLET | Freq: Every day | ORAL | 2 refills | Status: DC

## 2018-09-22 ENCOUNTER — Other Ambulatory Visit: Payer: Self-pay | Admitting: Family Medicine

## 2018-09-22 MED ORDER — LISINOPRIL 20 MG PO TABS: 20.0000 mg | ORAL_TABLET | Freq: Every day | ORAL | 3 refills | Status: DC

## 2018-11-23 ENCOUNTER — Other Ambulatory Visit: Payer: Self-pay

## 2018-11-24 ENCOUNTER — Ambulatory Visit (INDEPENDENT_AMBULATORY_CARE_PROVIDER_SITE_OTHER): Payer: Medicare Other | Admitting: Family Medicine

## 2018-11-24 ENCOUNTER — Encounter (INDEPENDENT_AMBULATORY_CARE_PROVIDER_SITE_OTHER): Payer: Self-pay

## 2018-11-24 ENCOUNTER — Encounter: Payer: Self-pay | Admitting: Family Medicine

## 2018-11-24 VITALS — BP 100/60 | HR 76 | Temp 98.0°F | Resp 16 | Ht 68.0 in | Wt 244.0 lb

## 2018-11-24 DIAGNOSIS — D649 Anemia, unspecified: Secondary | ICD-10-CM | POA: Diagnosis not present

## 2018-11-24 DIAGNOSIS — I10 Essential (primary) hypertension: Secondary | ICD-10-CM | POA: Diagnosis not present

## 2018-11-24 DIAGNOSIS — Z0001 Encounter for general adult medical examination with abnormal findings: Secondary | ICD-10-CM

## 2018-11-24 DIAGNOSIS — R6889 Other general symptoms and signs: Secondary | ICD-10-CM | POA: Diagnosis not present

## 2018-11-24 DIAGNOSIS — E785 Hyperlipidemia, unspecified: Secondary | ICD-10-CM

## 2018-11-24 DIAGNOSIS — Z0000 Encounter for general adult medical examination without abnormal findings: Secondary | ICD-10-CM

## 2018-11-24 DIAGNOSIS — R739 Hyperglycemia, unspecified: Secondary | ICD-10-CM | POA: Diagnosis not present

## 2018-11-24 NOTE — Progress Notes (Signed)
Subjective:    Patient ID: Tim Penton., male    DOB: 01/02/1981, 38 y.o.   MRN: [id number]  HPI  Patient is a very pleasant 38 year old Caucasian male here today for complete physical exam.  Unfortunately, since I last saw the patient, his father died who is also my patient.  Patient has been having a difficult time grieving for his loss father.  They were very close.  He seems to be getting better gradually.  He has an appointment to meet with his therapist in August.  He is also seeing a psychiatrist for bipolar disorder.  He takes Seroquel at night to help him sleep and fluvoxamine for depression.  His last lab work however showed an elevated blood sugar of 124.  He does not get regular aerobic exercise.  I am concerned about diabetes due to his elevated BMI coupled with his medication.  He also has a history of dyslipidemia.  He is due to recheck fasting lipid panel.  Recently he has reported orthostatic dizziness and lightheadedness with standing.  He denies any vertigo with turning his head.  It only occurs when he stands up.  Today his blood pressure is extremely low.  He is taking lisinopril for hypertension Past Medical History:  Diagnosis Date  . Avascular necrosis of bone of right hip ([place])   . Cervicalgia   . GAD (generalized anxiety disorder)   . Hypertension   . Hypertriglyceridemia   . Lumbago   . Lumbosacral spondylosis without myelopathy   . Myalgia and myositis, unspecified   . Other symptoms referable to back   . PSA (psoriatic arthritis) ([place])   . Smoker   . Thoracic spondylosis without myelopathy    Past Surgical History:  Procedure Laterality Date  . DISTAL CLAVICLE EXCISION Right 2017  . ROTATOR CUFF REPAIR  2010   right  . TONSILLECTOMY AND ADENOIDECTOMY  Age 15   Current Outpatient Medications on File Prior to Visit  Medication Sig Dispense Refill  . albuterol (PROVENTIL HFA;VENTOLIN HFA) 108 (90 Base) MCG/ACT inhaler Inhale 2 puffs into the lungs every  6 (six) hours as needed for wheezing or shortness of breath. 1 Inhaler 2  . clonazePAM (KLONOPIN) 1 MG tablet TAKE 1 TABLET BY MOUTH TWICE A DAY AS NEEDED FOR ANXIETY  5  . fenofibrate (TRICOR) 145 MG tablet Take 1 tablet (145 mg total) by mouth daily. 90 tablet 2  . fluvoxaMINE (LUVOX) 100 MG tablet Take 100 mg by mouth 2 (two) times daily.     Marland Kitchen gabapentin (NEURONTIN) 300 MG capsule Take 300 mg by mouth 3 (three) times daily.   2  . lisinopril (ZESTRIL) 20 MG tablet Take 1 tablet (20 mg total) by mouth daily. 90 tablet 3  . loratadine (CLARITIN) 10 MG tablet Take 10 mg by mouth daily.    . Oxycodone HCl 10 MG TABS Take 10 mg by mouth 4 (four) times daily.     . QUEtiapine (SEROQUEL) 400 MG tablet Take 400 mg by mouth at bedtime.    Marland Kitchen tiZANidine (ZANAFLEX) 4 MG tablet Take 4 mg by mouth 3 (three) times daily.    . traZODone (DESYREL) 150 MG tablet Take by mouth at bedtime.     No current facility-administered medications on file prior to visit.    Allergies  Allergen Reactions  . Ceclor [Cefaclor]   . Sulfa Antibiotics    Social History   Socioeconomic History  . Marital status: Single    Spouse name:  Not on file  . Number of children: Not on file  . Years of education: Not on file  . Highest education level: Not on file  Occupational History  . Not on file  Social Needs  . Financial resource strain: Not on file  . Food insecurity    Worry: Not on file    Inability: Not on file  . Transportation needs    Medical: Not on file    Non-medical: Not on file  Tobacco Use  . Smoking status: Current Every Day Smoker    Packs/day: 1.50    Years: 10.00    Pack years: 15.00    Types: Cigarettes  . Smokeless tobacco: Never Used  Substance and Sexual Activity  . Alcohol use: Yes    Alcohol/week: 0.0 standard drinks    Comment: occasional once a month  . Drug use: No  . Sexual activity: Not Currently  Lifestyle  . Physical activity    Days per week: Not on file    Minutes per  session: Not on file  . Stress: Not on file  Relationships  . Social Herbalist on phone: Not on file    Gets together: Not on file    Attends religious service: Not on file    Active member of club or organization: Not on file    Attends meetings of clubs or organizations: Not on file    Relationship status: Not on file  . Intimate partner violence    Fear of current or ex partner: Not on file    Emotionally abused: Not on file    Physically abused: Not on file    Forced sexual activity: Not on file  Other Topics Concern  . Not on file  Social History Narrative   05/21/2012 [name] was born and grew up in [place], [place]. He reports he had a good childhood. He has one sister who is 3 years younger than he. His parents are still married. He graduated from high school and attended [employer] studying information systems for 2 years but did not turn a degree. In high school he was a member of the [employer]. 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 He has never been married he has one son who is currently 55 years old. He shares custody with his son's mother, but the patient keeps his son most of the time. He denies any current legal issues. He reports that his social support system consists of his mother, father, and sister. He currently lives with his family. 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 He reports that he believes in God but he does not practice any religion. 05/21/2012 AHW   Family History  Problem Relation Age of Onset  . Hypertension Mother   . Hypertension Father   . Diabetes Father   . COPD Father   . Cancer Paternal Uncle   . Cancer Maternal Grandmother   . Cancer Paternal Grandmother   . ADD / ADHD Son      Review of Systems  All other systems reviewed and are negative.      Objective:   Physical Exam Vitals signs reviewed.  Constitutional:      General: He is not in  acute distress.    Appearance: Normal appearance. He is obese. He is not ill-appearing, toxic-appearing or diaphoretic.  HENT:     Head: Normocephalic and atraumatic.     Right Ear: Tympanic  membrane and ear canal normal. There is no impacted cerumen.     Left Ear: Tympanic membrane and ear canal normal. There is no impacted cerumen.     Nose: Nose normal. No congestion or rhinorrhea.     Mouth/Throat:     Pharynx: No oropharyngeal exudate or posterior oropharyngeal erythema.  Eyes:     General: No scleral icterus.    Extraocular Movements: Extraocular movements intact.     Conjunctiva/sclera: Conjunctivae normal.     Pupils: Pupils are equal, round, and reactive to light.  Neck:     Musculoskeletal: Neck supple.     Vascular: No carotid bruit.  Cardiovascular:     Rate and Rhythm: Normal rate and regular rhythm.     Pulses: Normal pulses.     Heart sounds: Normal heart sounds. No murmur. No friction rub. No gallop.   Pulmonary:     Effort: Pulmonary effort is normal. No respiratory distress.     Breath sounds: Normal breath sounds. No stridor. No wheezing or rhonchi.  Abdominal:     General: Bowel sounds are normal. There is no distension.     Palpations: Abdomen is soft. There is no mass.     Tenderness: There is no abdominal tenderness. There is no right CVA tenderness, left CVA tenderness, guarding or rebound.     Hernia: No hernia is present.  Musculoskeletal:     Right hip: He exhibits decreased range of motion and tenderness.     Right lower leg: No edema.     Left lower leg: No edema.  Lymphadenopathy:     Cervical: No cervical adenopathy.  Skin:    Coloration: Skin is not jaundiced or pale.     Findings: No bruising, erythema, lesion or rash.  Neurological:     General: No focal deficit present.     Mental Status: He is alert and oriented to person, place, and time.     Cranial Nerves: No cranial nerve deficit.     Sensory: No sensory deficit.     Motor: No  weakness.     Coordination: Coordination normal.     Gait: Gait normal.     Deep Tendon Reflexes: Reflexes normal.  Psychiatric:        Mood and Affect: Mood normal.        Behavior: Behavior normal.        Thought Content: Thought content normal.        Judgment: Judgment normal.           Assessment & Plan:  The primary encounter diagnosis was Dyslipidemia. Diagnoses of Elevated blood sugar, Routine general medical examination at a health care facility, and Benign essential HTN were also pertinent to this visit. Given his history of dyslipidemia, I will check a CMP, and a fasting lipid panel.  Ideally I like his triglycerides to be under 200 and I would like his HDL cholesterol to be over 40.  Given his recent elevated blood sugars I will also check a hemoglobin A1c to evaluate for type 2 diabetes mellitus.  If the patient's metabolic syndrome is worsening, we may need to discuss with a psychiatrist possibly changing his Seroquel.  Blood pressure today is extremely low.  I believe the patient is experiencing orthostatic hypotension causing his symptoms.  I recommended temporarily discontinuing lisinopril for 2 weeks and then reporting his blood pressures to [place] to determine if he needs to resume medication albeit at a lower dose.  I would also be interested  to see if his symptoms improve off the blood pressure medication.  Also encouraged the patient to drink more water.

## 2018-11-29 LAB — TEST AUTHORIZATION

## 2018-11-29 LAB — COMPLETE METABOLIC PANEL WITH GFR
AG Ratio: 1.3 (calc) (ref 1.0–2.5)
ALT: 17 U/L (ref 9–46)
AST: 16 U/L (ref 10–40)
Albumin: 4 g/dL (ref 3.6–5.1)
Alkaline phosphatase (APISO): 48 U/L (ref 36–130)
BUN/Creatinine Ratio: 14 (calc) (ref 6–22)
BUN: 21 mg/dL (ref 7–25)
CO2: 19 mmol/L — ABNORMAL LOW (ref 20–32)
Calcium: 9.3 mg/dL (ref 8.6–10.3)
Chloride: 107 mmol/L (ref 98–110)
Creat: 1.48 mg/dL — ABNORMAL HIGH (ref 0.60–1.35)
GFR, Est African American: 70 mL/min/{1.73_m2} (ref >=60)
GFR, Est Non African American: 60 mL/min/{1.73_m2} (ref >=60)
Globulin: 3 g/dL (calc) (ref 1.9–3.7)
Glucose, Bld: 83 mg/dL (ref 65–99)
Potassium: 4.4 mmol/L (ref 3.5–5.3)
Sodium: 135 mmol/L (ref 135–146)
Total Bilirubin: 0.2 mg/dL (ref 0.2–1.2)
Total Protein: 7 g/dL (ref 6.1–8.1)

## 2018-11-29 LAB — HEMOGLOBIN A1C
Hgb A1c MFr Bld: 5.5 % of total Hgb (ref <5.7)
Mean Plasma Glucose: 111 (calc)
eAG (mmol/L): 6.2 (calc)

## 2018-11-29 LAB — CBC WITH DIFFERENTIAL/PLATELET
Absolute Monocytes: 291 cells/uL (ref 200–950)
Basophils Absolute: 31 cells/uL (ref 0–200)
Basophils Relative: 0.5 %
Eosinophils Absolute: 149 cells/uL (ref 15–500)
Eosinophils Relative: 2.4 %
HCT: 29.7 % — ABNORMAL LOW (ref 38.5–50.0)
Hemoglobin: 10.1 g/dL — ABNORMAL LOW (ref 13.2–17.1)
Lymphs Abs: 1934 cells/uL (ref 850–3900)
MCH: 29.9 pg (ref 27.0–33.0)
MCHC: 34 g/dL (ref 32.0–36.0)
MCV: 87.9 fL (ref 80.0–100.0)
MPV: 10 fL (ref 7.5–12.5)
Monocytes Relative: 4.7 %
Neutro Abs: 3794 cells/uL (ref 1500–7800)
Neutrophils Relative %: 61.2 %
Platelets: 289 10*3/uL (ref 140–400)
RBC: 3.38 10*6/uL — ABNORMAL LOW (ref 4.20–5.80)
RDW: 13.7 % (ref 11.0–15.0)
Total Lymphocyte: 31.2 %
WBC: 6.2 10*3/uL (ref 3.8–10.8)

## 2018-11-29 LAB — IRON: Iron: 108 ug/dL (ref 50–180)

## 2018-11-29 LAB — LIPID PANEL
Cholesterol: 138 mg/dL (ref <200)
HDL: 23 mg/dL — ABNORMAL LOW (ref >=40)
LDL Cholesterol (Calc): 86 mg/dL (calc)
Non-HDL Cholesterol (Calc): 115 mg/dL (calc) (ref <130)
Total CHOL/HDL Ratio: 6 (calc) — ABNORMAL HIGH (ref <5.0)
Triglycerides: 192 mg/dL — ABNORMAL HIGH (ref <150)

## 2018-11-29 LAB — VITAMIN B12: Vitamin B-12: 360 pg/mL (ref 200–1100)

## 2018-12-15 DIAGNOSIS — G894 Chronic pain syndrome: Secondary | ICD-10-CM | POA: Diagnosis not present

## 2018-12-15 DIAGNOSIS — Z79891 Long term (current) use of opiate analgesic: Secondary | ICD-10-CM | POA: Diagnosis not present

## 2018-12-15 DIAGNOSIS — Z5181 Encounter for therapeutic drug level monitoring: Secondary | ICD-10-CM | POA: Diagnosis not present

## 2018-12-15 DIAGNOSIS — Z79899 Other long term (current) drug therapy: Secondary | ICD-10-CM | POA: Diagnosis not present

## 2018-12-15 DIAGNOSIS — M546 Pain in thoracic spine: Secondary | ICD-10-CM | POA: Diagnosis not present

## 2018-12-15 DIAGNOSIS — M545 Low back pain: Secondary | ICD-10-CM | POA: Diagnosis not present

## 2018-12-21 ENCOUNTER — Other Ambulatory Visit: Payer: Self-pay | Admitting: Family Medicine

## 2018-12-21 DIAGNOSIS — J209 Acute bronchitis, unspecified: Secondary | ICD-10-CM

## 2018-12-21 MED ORDER — ALBUTEROL SULFATE HFA 108 (90 BASE) MCG/ACT IN AERS: 2.0000 | INHALATION_SPRAY | Freq: Four times a day (QID) | RESPIRATORY_TRACT | 2 refills | Status: DC | PRN

## 2018-12-23 DIAGNOSIS — M546 Pain in thoracic spine: Secondary | ICD-10-CM | POA: Diagnosis not present

## 2018-12-23 DIAGNOSIS — M545 Low back pain: Secondary | ICD-10-CM | POA: Diagnosis not present

## 2018-12-31 DIAGNOSIS — G894 Chronic pain syndrome: Secondary | ICD-10-CM | POA: Diagnosis not present

## 2018-12-31 DIAGNOSIS — M545 Low back pain: Secondary | ICD-10-CM | POA: Diagnosis not present

## 2018-12-31 DIAGNOSIS — Z79891 Long term (current) use of opiate analgesic: Secondary | ICD-10-CM | POA: Diagnosis not present

## 2018-12-31 DIAGNOSIS — M47816 Spondylosis without myelopathy or radiculopathy, lumbar region: Secondary | ICD-10-CM | POA: Diagnosis not present

## 2018-12-31 DIAGNOSIS — M5136 Other intervertebral disc degeneration, lumbar region: Secondary | ICD-10-CM | POA: Diagnosis not present

## 2019-01-05 DIAGNOSIS — M87051 Idiopathic aseptic necrosis of right femur: Secondary | ICD-10-CM | POA: Diagnosis not present

## 2019-01-05 DIAGNOSIS — M87052 Idiopathic aseptic necrosis of left femur: Secondary | ICD-10-CM | POA: Diagnosis not present

## 2019-01-25 DIAGNOSIS — M87051 Idiopathic aseptic necrosis of right femur: Secondary | ICD-10-CM | POA: Diagnosis not present

## 2019-01-25 DIAGNOSIS — M5116 Intervertebral disc disorders with radiculopathy, lumbar region: Secondary | ICD-10-CM | POA: Diagnosis not present

## 2019-02-22 ENCOUNTER — Other Ambulatory Visit: Payer: Self-pay

## 2019-02-22 ENCOUNTER — Ambulatory Visit (INDEPENDENT_AMBULATORY_CARE_PROVIDER_SITE_OTHER): Payer: Medicare Other

## 2019-02-22 DIAGNOSIS — Z23 Encounter for immunization: Secondary | ICD-10-CM

## 2019-02-24 ENCOUNTER — Telehealth: Payer: Self-pay | Admitting: Family Medicine

## 2019-02-24 NOTE — Telephone Encounter (Signed)
Pt called and wanted to know if we could write him for a handicap placard for his back issues and COPD. He talked to the ortho and they told him to ask [place] or a Psychologist, sport and exercise and he does not have a Psychologist, sport and exercise.  CB# [phone]

## 2019-02-25 NOTE — Telephone Encounter (Signed)
I do not feel that this is medically necessary.  Particular if his orthopedic would not fill out a handicap placard

## 2019-03-01 NOTE — Telephone Encounter (Signed)
Informed pt and he states that ortho does not do handicap placards at all and referred him to PCP. Pt requested apt to discuss with Dr. Dennard [name]. Apt made.

## 2019-03-02 ENCOUNTER — Ambulatory Visit (INDEPENDENT_AMBULATORY_CARE_PROVIDER_SITE_OTHER): Payer: Medicare Other | Admitting: Family Medicine

## 2019-03-02 ENCOUNTER — Other Ambulatory Visit: Payer: Self-pay

## 2019-03-02 VITALS — BP 132/78 | HR 80 | Temp 97.6°F | Resp 20 | Ht 68.0 in | Wt 255.0 lb

## 2019-03-02 DIAGNOSIS — M47816 Spondylosis without myelopathy or radiculopathy, lumbar region: Secondary | ICD-10-CM

## 2019-03-02 DIAGNOSIS — M47814 Spondylosis without myelopathy or radiculopathy, thoracic region: Secondary | ICD-10-CM | POA: Diagnosis not present

## 2019-03-02 DIAGNOSIS — M87051 Idiopathic aseptic necrosis of right femur: Secondary | ICD-10-CM

## 2019-03-02 NOTE — Progress Notes (Signed)
Subjective:    Patient ID: Tim [name]., male    DOB: Apr 13, 1981, 38 y.o.   MRN: [id number]  HPI Patient is here today requesting a handicap placard for his car.  He is a 38 year old white male.  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  This is being managed by Dr. Lyla Glassing.  Currently they are treating this with a combination of pain medication.  Per the patient's report they have deferred hip replacement until further deterioration occurs.  This causes him significant pain.  He states that he is unable to walk 200 feet.  He has to stop frequently and rest.  He is not using a wheelchair, cane, or walker.  However he does have to pull himself up steps because he has a difficult time putting any weight on his right hip due to severe shooting pain in his hip.  He also has a history of degenerative disc disease.  Patient had an MRI of the thoracic spine in August of this year.  Patient had multilevel degenerative disc disease.  He had mild to moderate left T6-T7 neuroforaminal narrowing but no spinal canal stenosis.  He had mild to moderate left T5-6 neuroforaminal narrowing.  He had mild to moderate T3-T4 normal foraminal narrowing he had a small broad left subarticular T11-12 disc protrusion mildly displacing the left ventral T12 nerve root.  He had moderate dextroconvex thoracic scoliosis.  He also had an MRI of the lumbar spine in August 2020 that showed a new small right foraminal L4-L5 disc protrusion superimposed upon moderate bulging disc asymmetric to the right resulting in moderate right neuroforaminal narrowing and mild impingement of the exiting right L4 nerve roots.  They have planned epidural steroid injections in this area.  All of these caused right-sided low back pain and right hip pain and right leg pain that significantly affect his ability to walk. Past Medical History:  Diagnosis Date   Avascular necrosis of bone of right hip (HCC)    Cervicalgia    GAD  (generalized anxiety disorder)    Hypertension    Hypertriglyceridemia    Lumbago    Lumbosacral spondylosis without myelopathy    Myalgia and myositis, unspecified    Other symptoms referable to back    PSA (psoriatic arthritis) ([place])    Smoker    Thoracic spondylosis without myelopathy    Past Surgical History:  Procedure Laterality Date   DISTAL CLAVICLE EXCISION Right 2017   ROTATOR CUFF REPAIR  2010   right   TONSILLECTOMY AND ADENOIDECTOMY  Age 28   Current Outpatient Medications on File Prior to Visit  Medication Sig Dispense Refill   albuterol (VENTOLIN HFA) 108 (90 Base) MCG/ACT inhaler Inhale 2 puffs into the lungs every 6 (six) hours as needed for wheezing or shortness of breath. 18 g 2   clonazePAM (KLONOPIN) 1 MG tablet Take 1 mg by mouth 2 (two) times daily.   5   fenofibrate (TRICOR) 145 MG tablet Take 1 tablet (145 mg total) by mouth daily. 90 tablet 2   fluvoxaMINE (LUVOX) 100 MG tablet Take 100 mg by mouth 2 (two) times daily.      gabapentin (NEURONTIN) 300 MG capsule Take 300 mg by mouth 3 (three) times daily.   2   loratadine (CLARITIN) 10 MG tablet Take 10 mg by mouth daily.     Oxycodone HCl 10 MG TABS Take 10 mg by mouth 4 (four) times daily.  QUEtiapine (SEROQUEL) 400 MG tablet Take 400 mg by mouth at bedtime.     tiZANidine (ZANAFLEX) 4 MG tablet Take 4 mg by mouth 3 (three) times daily.     traZODone (DESYREL) 150 MG tablet Take by mouth at bedtime.     No current facility-administered medications on file prior to visit.    Allergies  Allergen Reactions   Ceclor [Cefaclor]    Sulfa Antibiotics    Social History   Socioeconomic History   Marital status: Single    Spouse name: Not on file   Number of children: Not on file   Years of education: Not on file   Highest education level: Not on file  Occupational History   Not on file  Social Needs   Financial resource strain: Not on file   Food insecurity     Worry: Not on file    Inability: Not on file   Transportation needs    Medical: Not on file    Non-medical: Not on file  Tobacco Use   Smoking status: Current Every Day Smoker    Packs/day: 1.50    Years: 10.00    Pack years: 15.00    Types: Cigarettes   Smokeless tobacco: Never Used  Substance and Sexual Activity   Alcohol use: Yes    Alcohol/week: 0.0 standard drinks    Comment: occasional once a month   Drug use: No   Sexual activity: Not Currently  Lifestyle   Physical activity    Days per week: Not on file    Minutes per session: Not on file   Stress: Not on file  Relationships   Social connections    Talks on phone: Not on file    Gets together: Not on file    Attends religious service: Not on file    Active member of club or organization: Not on file    Attends meetings of clubs or organizations: Not on file    Relationship status: Not on file   Intimate partner violence    Fear of current or ex partner: Not on file    Emotionally abused: Not on file    Physically abused: Not on file    Forced sexual activity: Not on file  Other Topics Concern   Not on file  Social History Narrative   05/21/2012 AHW [name] was born and grew up in [place], [place]. He reports he had a good childhood. He has one sister who is 3 years younger than he. His parents are still married. He graduated from high school and attended [employer] studying information systems for 2 years but did not turn a degree. In high school he was a member of the [employer]. 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 He has never been married he has one son who is currently 20 years old. He shares custody with his son's mother, but the patient keeps his son most of the time. He denies any current legal issues. He reports that his social support system consists of his mother, father, and sister. He currently lives with his family. 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 He reports that he believes in God but he does not practice any religion. 05/21/2012 AHW    Review of Systems  All other systems reviewed and are negative.      Objective:   Physical Exam Vitals signs reviewed.  Constitutional:      Appearance: He is obese.  Cardiovascular:     Rate and Rhythm: Normal rate and regular rhythm.  Pulmonary:     Effort: Pulmonary effort is normal.     Breath sounds: Normal breath sounds.  Musculoskeletal:     Right hip: He exhibits decreased range of motion, decreased strength and tenderness.     Lumbar back: He exhibits decreased range of motion, tenderness and pain.       Back:       Legs:  Neurological:     Mental Status: He is alert.           Assessment & Plan:  Avascular necrosis of right femur (HCC)  Lumbar spondylosis  Thoracic spondylosis  Patient has avascular necrosis of the right hip that significantly impairs his ability to walk.  This is a permanent condition that will not improve until the patient has a right hip replacement which has been delayed at present.  He also has degenerative disc disease in the thoracic and lumbar spine with new right foraminal narrowing at L4-L5 and possible nerve impingement of the right L4 nerve root that contributes to his pain in his right hip and right lower leg and right back.  This temporarily is also contributing to his difficulty walking.  These qualify the patient for handicap placard to allow him to park closer to stores because he is unable to walk more than 200 feet without having to stop to rest.

## 2019-03-09 ENCOUNTER — Other Ambulatory Visit: Payer: Self-pay | Admitting: Family Medicine

## 2019-03-09 DIAGNOSIS — J209 Acute bronchitis, unspecified: Secondary | ICD-10-CM

## 2019-03-13 ENCOUNTER — Other Ambulatory Visit: Payer: Self-pay | Admitting: Family Medicine

## 2019-04-01 DIAGNOSIS — M5136 Other intervertebral disc degeneration, lumbar region: Secondary | ICD-10-CM | POA: Diagnosis not present

## 2019-04-01 DIAGNOSIS — M549 Dorsalgia, unspecified: Secondary | ICD-10-CM | POA: Diagnosis not present

## 2019-04-01 DIAGNOSIS — M5030 Other cervical disc degeneration, unspecified cervical region: Secondary | ICD-10-CM | POA: Diagnosis not present

## 2019-04-01 DIAGNOSIS — M87859 Other osteonecrosis, unspecified femur: Secondary | ICD-10-CM | POA: Diagnosis not present

## 2019-04-01 DIAGNOSIS — Z79891 Long term (current) use of opiate analgesic: Secondary | ICD-10-CM | POA: Diagnosis not present

## 2019-05-17 ENCOUNTER — Telehealth: Payer: Self-pay | Admitting: Family Medicine

## 2019-05-17 NOTE — Telephone Encounter (Signed)
Pt called and states that his BP has been running high since stopping the Klonopin. [id number] and we had taken him off his BP meds last time cause his BP was so good but now it has jumped back up. Please advise.

## 2019-05-18 MED ORDER — LOSARTAN POTASSIUM 100 MG PO TABS: 100.0000 mg | ORAL_TABLET | Freq: Every day | ORAL | 3 refills | Status: DC

## 2019-05-18 NOTE — Telephone Encounter (Signed)
Pt aware and med sent to pharm and apt made

## 2019-05-18 NOTE — Telephone Encounter (Signed)
Given those readings, I would recommend losartan 100 mg poqday and recheck labs and bp in 3 weeks.

## 2019-05-31 ENCOUNTER — Telehealth: Payer: Self-pay | Admitting: Family Medicine

## 2019-05-31 MED ORDER — LISINOPRIL 40 MG PO TABS: 40.0000 mg | ORAL_TABLET | Freq: Every day | ORAL | 3 refills | Status: DC

## 2019-05-31 NOTE — Telephone Encounter (Signed)
Pt called and states that his BP is still elevated. He states that he was on lisinopril in the past and it controlled his BP better then the Losartan. He is having daily HA's. His BP is running 145-155 / 80's.

## 2019-05-31 NOTE — Telephone Encounter (Signed)
Pt aware and med sent to pharm 

## 2019-05-31 NOTE — Telephone Encounter (Signed)
Try switching losartan to lisinopril 40 mg poqday and recheck bp and bmp at ov in 3 weeks.

## 2019-06-06 IMAGING — DX DG CHEST 2V
2 series · 2 of 2 positions shown · non-contrast
Comparison: Multiple exams, including CT chest from 04/20/2007

CLINICAL DATA: Chronic cough. Shortness of breath. Hypertension.
Tobacco use.

EXAM:
CHEST - 2 VIEW

[chest pa]
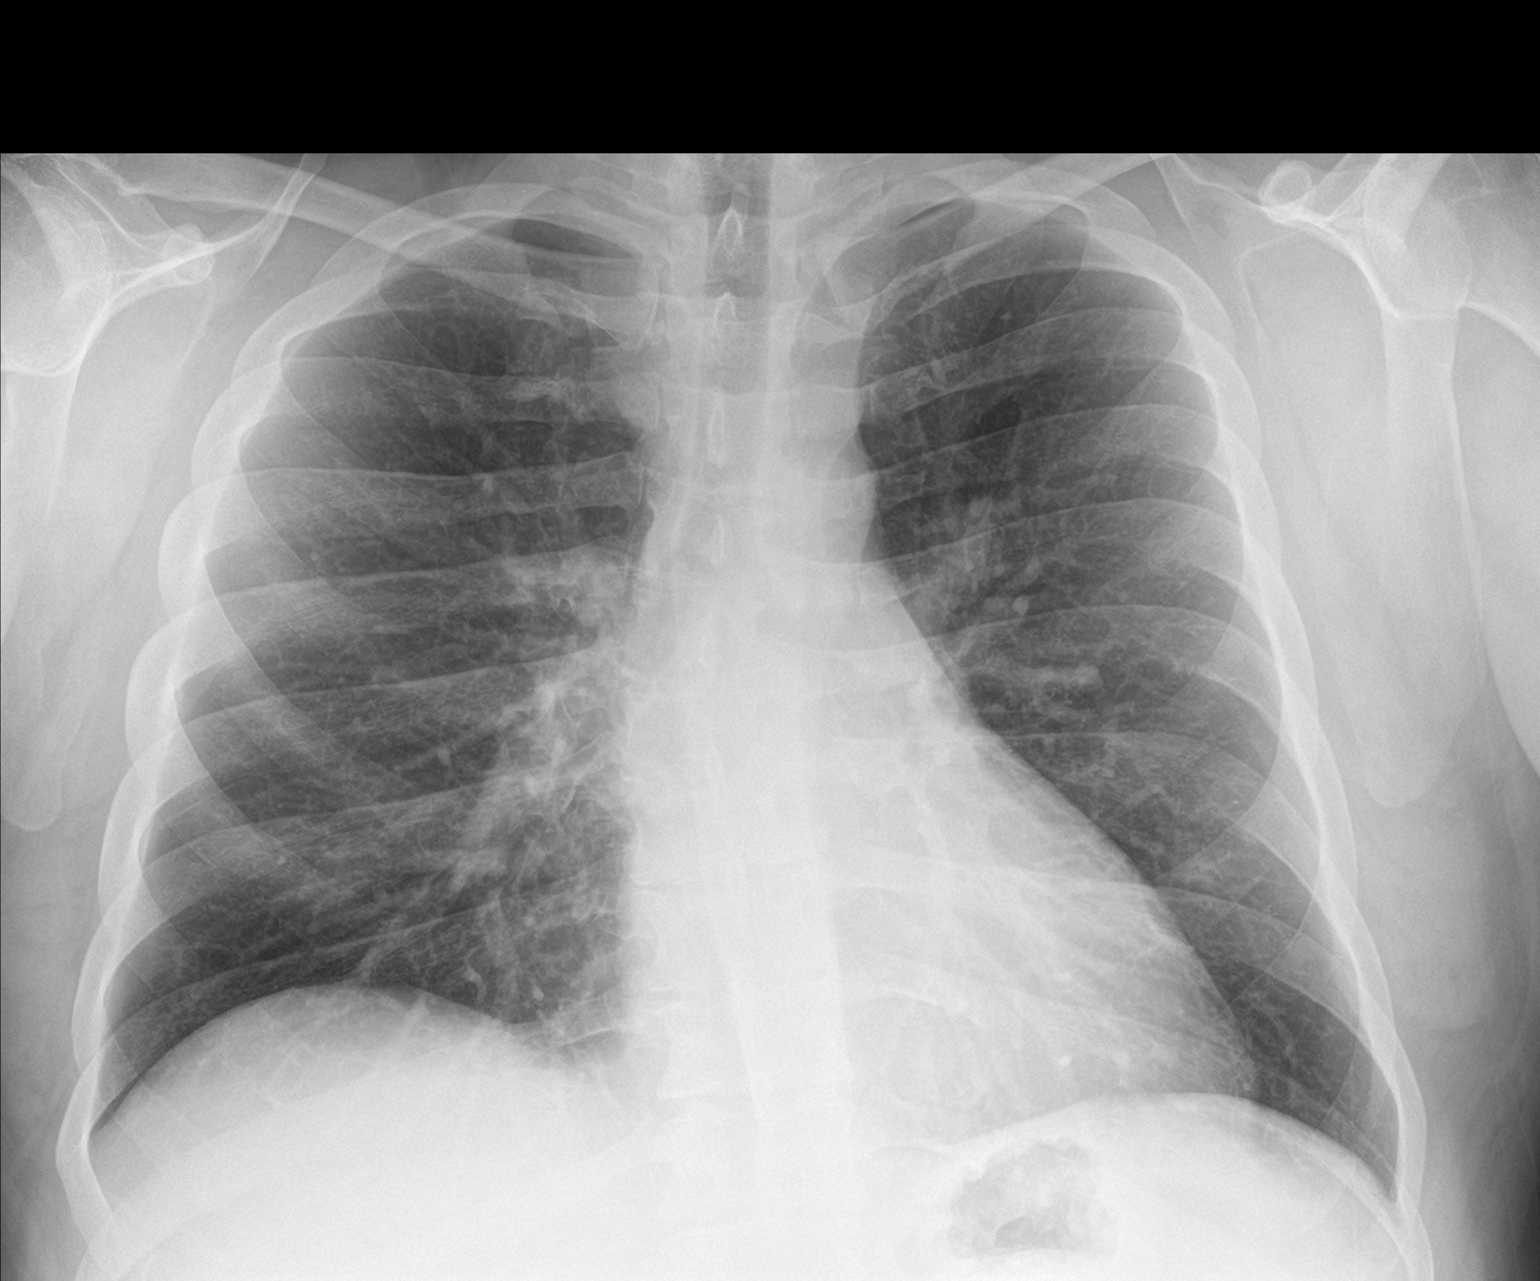

[chest lat]
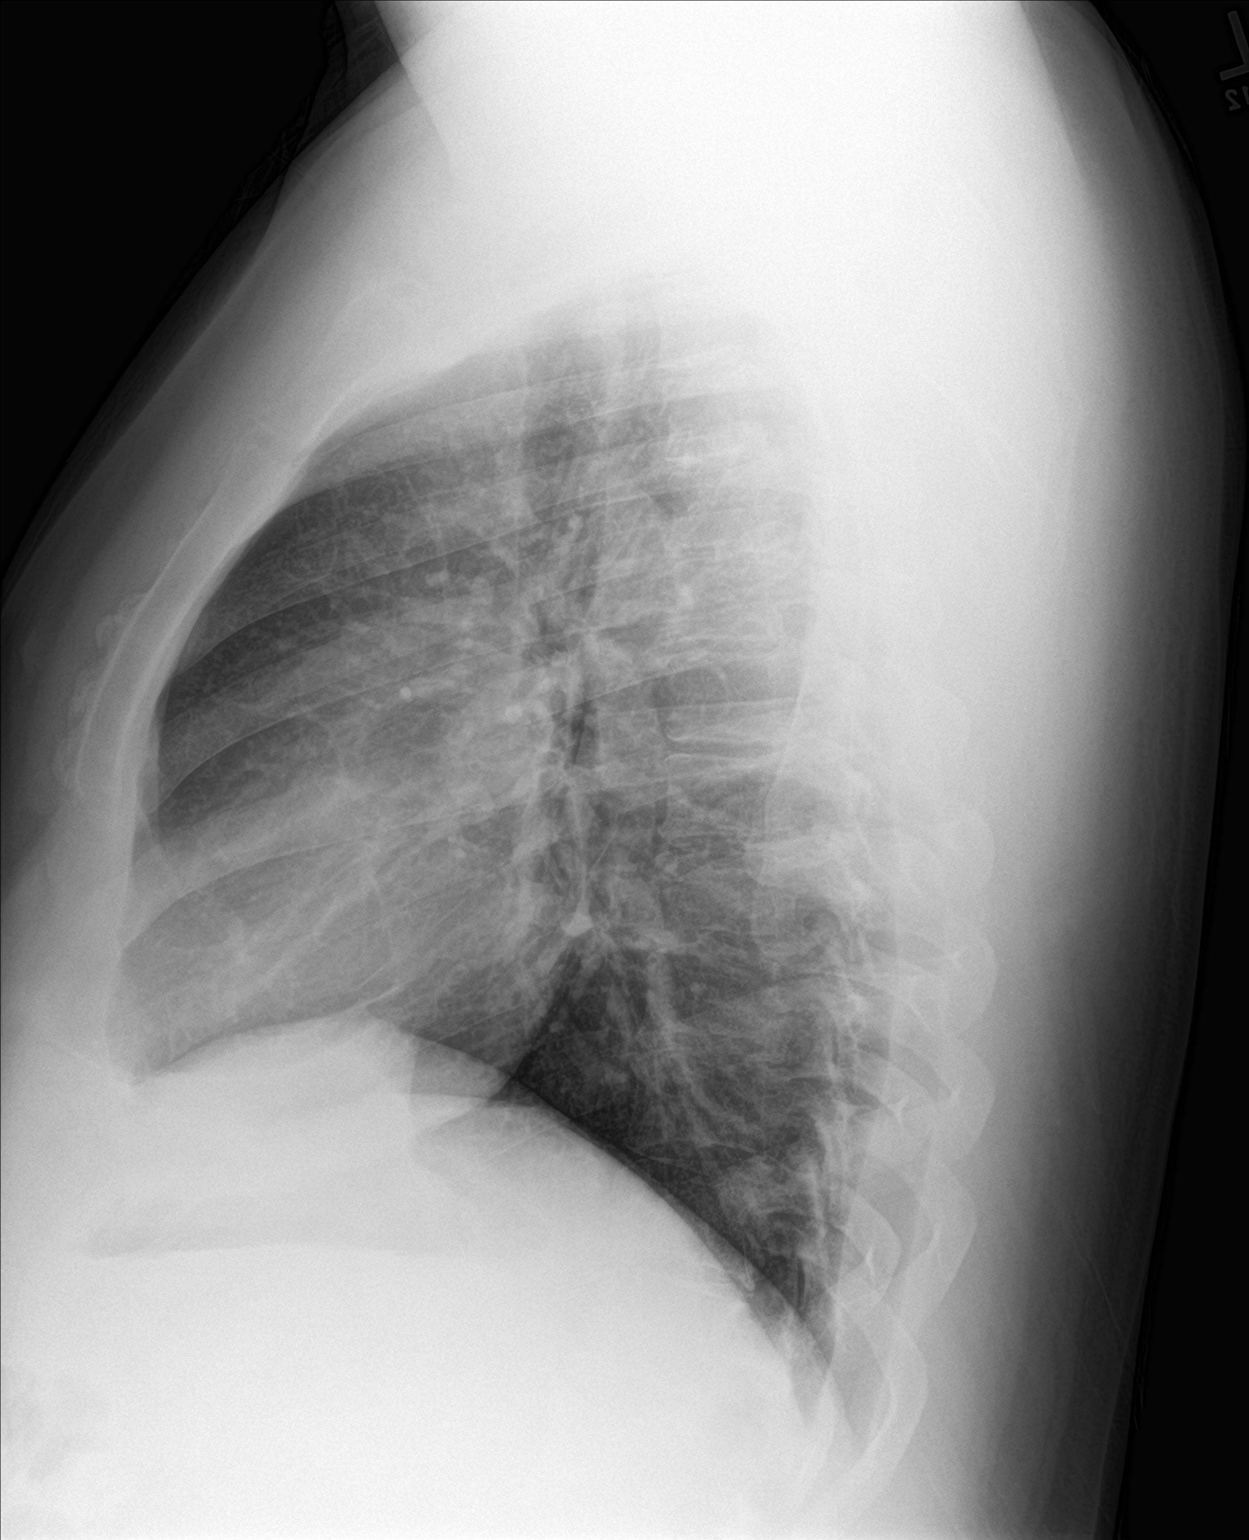

[2 of 2 positions shown; findings below may reference images not displayed]

FINDINGS: Mild dextroconvex thoracic scoliosis.

The lungs appear clear.  Cardiac and mediastinal contours normal.

No pleural effusion identified.
IMPRESSION: 1.  No active cardiopulmonary disease is radiographically apparent.
2. Dextroconvex thoracic scoliosis.

## 2019-06-08 ENCOUNTER — Other Ambulatory Visit: Payer: Self-pay | Admitting: Family Medicine

## 2019-06-08 DIAGNOSIS — J209 Acute bronchitis, unspecified: Secondary | ICD-10-CM

## 2019-06-15 ENCOUNTER — Ambulatory Visit: Payer: Medicare Other | Admitting: Family Medicine

## 2019-06-21 ENCOUNTER — Ambulatory Visit (INDEPENDENT_AMBULATORY_CARE_PROVIDER_SITE_OTHER): Payer: Medicare Other | Admitting: Family Medicine

## 2019-06-21 ENCOUNTER — Encounter: Payer: Self-pay | Admitting: Family Medicine

## 2019-06-21 ENCOUNTER — Other Ambulatory Visit: Payer: Self-pay

## 2019-06-21 VITALS — BP 98/60 | HR 78 | Temp 97.5°F | Resp 18 | Ht 68.0 in | Wt 267.0 lb

## 2019-06-21 DIAGNOSIS — I10 Essential (primary) hypertension: Secondary | ICD-10-CM | POA: Diagnosis not present

## 2019-06-21 DIAGNOSIS — E785 Hyperlipidemia, unspecified: Secondary | ICD-10-CM

## 2019-06-21 LAB — CBC WITH DIFFERENTIAL/PLATELET
Absolute Monocytes: 399 cells/uL (ref 200–950)
Basophils Absolute: 40 cells/uL (ref 0–200)
Basophils Relative: 0.7 %
Eosinophils Absolute: 257 cells/uL (ref 15–500)
Eosinophils Relative: 4.5 %
HCT: 37.1 % — ABNORMAL LOW (ref 38.5–50.0)
Hemoglobin: 12.7 g/dL — ABNORMAL LOW (ref 13.2–17.1)
Lymphs Abs: 2252 cells/uL (ref 850–3900)
MCH: 30.4 pg (ref 27.0–33.0)
MCHC: 34.2 g/dL (ref 32.0–36.0)
MCV: 88.8 fL (ref 80.0–100.0)
MPV: 10.7 fL (ref 7.5–12.5)
Monocytes Relative: 7 %
Neutro Abs: 2753 cells/uL (ref 1500–7800)
Neutrophils Relative %: 48.3 %
Platelets: 252 10*3/uL (ref 140–400)
RBC: 4.18 10*6/uL — ABNORMAL LOW (ref 4.20–5.80)
RDW: 13.1 % (ref 11.0–15.0)
Total Lymphocyte: 39.5 %
WBC: 5.7 10*3/uL (ref 3.8–10.8)

## 2019-06-21 LAB — COMPLETE METABOLIC PANEL WITH GFR
AG Ratio: 1.6 (calc) (ref 1.0–2.5)
ALT: 27 U/L (ref 9–46)
AST: 18 U/L (ref 10–40)
Albumin: 4.4 g/dL (ref 3.6–5.1)
Alkaline phosphatase (APISO): 48 U/L (ref 36–130)
BUN: 22 mg/dL (ref 7–25)
CO2: 21 mmol/L (ref 20–32)
Calcium: 9.5 mg/dL (ref 8.6–10.3)
Chloride: 106 mmol/L (ref 98–110)
Creat: 1.23 mg/dL (ref 0.60–1.35)
GFR, Est African American: 86 mL/min/{1.73_m2} (ref >=60)
GFR, Est Non African American: 75 mL/min/{1.73_m2} (ref >=60)
Globulin: 2.8 g/dL (calc) (ref 1.9–3.7)
Glucose, Bld: 98 mg/dL (ref 65–99)
Potassium: 4.7 mmol/L (ref 3.5–5.3)
Sodium: 136 mmol/L (ref 135–146)
Total Bilirubin: 0.3 mg/dL (ref 0.2–1.2)
Total Protein: 7.2 g/dL (ref 6.1–8.1)

## 2019-06-21 LAB — LIPID PANEL
Cholesterol: 155 mg/dL (ref <200)
HDL: 28 mg/dL — ABNORMAL LOW (ref >=40)
LDL Cholesterol (Calc): 98 mg/dL (calc)
Non-HDL Cholesterol (Calc): 127 mg/dL (calc) (ref <130)
Total CHOL/HDL Ratio: 5.5 (calc) — ABNORMAL HIGH (ref <5.0)
Triglycerides: 197 mg/dL — ABNORMAL HIGH (ref <150)

## 2019-06-21 MED ORDER — LISINOPRIL 20 MG PO TABS: 20.0000 mg | ORAL_TABLET | Freq: Every day | ORAL | 3 refills | Status: DC

## 2019-06-21 NOTE — Progress Notes (Signed)
Subjective:    Patient ID: Tim Penton., male    DOB: [birth year], 38 y.o.   MRN: [id number]  Patient is currently taking lisinopril 20 mg a day for his hypertension.  I previously prescribed 40 mg a day.  The patient was checking his blood pressure soon as he would wake up in the morning at 6:00.  At that point his blood pressure was between 140 and 150/80-95.  However within an hour of taking his blood pressure medicine, his muscle relaxers, and his pain killers, his systolic blood pressure was dropping to between 90 and 110.  Therefore he decreased his lisinopril to 20 mg a day.  Now his systolic blood pressure is consistently 110-120/50-64.  He denies any chest pain shortness of breath or dyspnea on exertion.  He has cut back on his smoking and is down to half a pack a day.  His lungs sound much better on exam today.  He is wheezing less.  However I continue to encourage him to discontinue smoking given the history of severe COPD in his father.  His father was oxygen dependent prior to his death. Past Medical History:  Diagnosis Date  . Avascular necrosis of bone of right hip ([place])   . Cervicalgia   . GAD (generalized anxiety disorder)   . Hypertension   . Hypertriglyceridemia   . Lumbago   . Lumbosacral spondylosis without myelopathy   . Myalgia and myositis, unspecified   . Other symptoms referable to back   . PSA (psoriatic arthritis) ([place])   . Smoker   . Thoracic spondylosis without myelopathy    Past Surgical History:  Procedure Laterality Date  . DISTAL CLAVICLE EXCISION Right 2017  . ROTATOR CUFF REPAIR  2010   right  . TONSILLECTOMY AND ADENOIDECTOMY  Age 50   Current Outpatient Medications on File Prior to Visit  Medication Sig Dispense Refill  . albuterol (VENTOLIN HFA) 108 (90 Base) MCG/ACT inhaler INHALE 2 PUFFS INTO THE LUNGS EVERY 6 HOURS AS NEEDED FOR WHEEZE OR SHORTNESS OF BREATH 8.5 g 2  . fenofibrate (TRICOR) 145 MG tablet TAKE 1 TABLET EVERY DAY 90 tablet 1   . fluvoxaMINE (LUVOX) 100 MG tablet Take 100 mg by mouth 2 (two) times daily.     Marland Kitchen gabapentin (NEURONTIN) 300 MG capsule Take 300 mg by mouth 3 (three) times daily.   2  . lisinopril (ZESTRIL) 40 MG tablet Take 1 tablet (40 mg total) by mouth daily. 90 tablet 3  . loratadine (CLARITIN) 10 MG tablet Take 10 mg by mouth daily.    . Oxycodone HCl 10 MG TABS Take 10 mg by mouth 4 (four) times daily.     . QUEtiapine (SEROQUEL) 400 MG tablet Take 400 mg by mouth at bedtime.    Marland Kitchen tiZANidine (ZANAFLEX) 4 MG tablet Take 4 mg by mouth 3 (three) times daily.    . traZODone (DESYREL) 150 MG tablet Take by mouth at bedtime.     No current facility-administered medications on file prior to visit.   Allergies  Allergen Reactions  . Ceclor [Cefaclor]   . Sulfa Antibiotics    Social History   Socioeconomic History  . Marital status: Single    Spouse name: Not on file  . Number of children: Not on file  . Years of education: Not on file  . Highest education level: Not on file  Occupational History  . Not on file  Tobacco Use  . Smoking status: Current Every Day  Smoker    Packs/day: 1.50    Years: 10.00    Pack years: 15.00    Types: Cigarettes  . Smokeless tobacco: Never Used  Substance and Sexual Activity  . Alcohol use: Yes    Alcohol/week: 0.0 standard drinks    Comment: occasional once a month  . Drug use: No  . Sexual activity: Not Currently  Other Topics Concern  . Not on file  Social History Narrative   05/21/2012 AHW [name] was born and grew up in [place], [place]. He reports he had a good childhood. He has one sister who is 3 years younger than he. His parents are still married. He graduated from high school and attended [employer] studying information systems for 2 years but did not turn a degree. In high school he was a member of the [employer]. 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 He shares custody with his son's mother, but the patient keeps his son most of the time. He denies any current legal issues. He reports that his social support system consists of his mother, father, and sister. He currently lives with his family. 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 He reports that he believes in God but he does not practice any religion. 05/21/2012 AHW   Social Determinants of Health   Financial Resource Strain:   . Difficulty of Paying Living Expenses: Not on file  Food Insecurity:   . Worried About Charity fundraiser in the Last Year: Not on file  . Ran Out of Food in the Last Year: Not on file  Transportation Needs:   . Lack of Transportation (Medical): Not on file  . Lack of Transportation (Non-Medical): Not on file  Physical Activity:   . Days of Exercise per Week: Not on file  . Minutes of Exercise per Session: Not on file  Stress:   . Feeling of Stress : Not on file  Social Connections:   . Frequency of Communication with Friends and Family: Not on file  . Frequency of Social Gatherings with Friends and Family: Not on file  . Attends Religious Services: Not on file  . Active Member of Clubs or Organizations: Not on file  . Attends Archivist Meetings: Not on file  . Marital Status: Not on file  Intimate Partner Violence:   . Fear of Current or Ex-Partner: Not on file  . Emotionally Abused: Not on file  . Physically Abused: Not on file  . Sexually Abused: Not on file    Review of Systems  All other systems reviewed and are negative.      Objective:   Physical Exam Vitals reviewed.  Constitutional:      Appearance: He is obese.  Cardiovascular:     Rate and Rhythm: Normal rate and regular rhythm.  Pulmonary:     Effort: Pulmonary effort is normal.     Breath sounds: Normal breath sounds.  Neurological:     Mental Status: He is alert.           Assessment & Plan:  Benign  essential HTN - Plan: CBC with Differential/Platelet, COMPLETE METABOLIC PANEL WITH GFR, Lipid panel  Dyslipidemia  Blood pressure seems to be well controlled on 20 mg a day of lisinopril.  We will make no changes in his lisinopril dosage.  I recommended that he check his  blood pressure 1 hour after he wakes up after he is taking his medication to avoid overmedicating the patient based on his initial spike in his blood pressure soon as he wakes up.  Also check a CMP and a fasting lipid panel to monitor his chronic kidney disease and dyslipidemia.  Tim [name]

## 2019-08-04 DIAGNOSIS — M545 Low back pain: Secondary | ICD-10-CM | POA: Diagnosis not present

## 2019-09-15 ENCOUNTER — Encounter: Payer: Self-pay | Admitting: Family Medicine

## 2019-09-15 DIAGNOSIS — J209 Acute bronchitis, unspecified: Secondary | ICD-10-CM

## 2019-09-15 MED ORDER — ALBUTEROL SULFATE HFA 108 (90 BASE) MCG/ACT IN AERS: INHALATION_SPRAY | RESPIRATORY_TRACT | 2 refills | Status: DC

## 2019-09-22 ENCOUNTER — Other Ambulatory Visit: Payer: Self-pay | Admitting: Family Medicine

## 2019-09-22 MED ORDER — FENOFIBRATE 145 MG PO TABS: 145.0000 mg | ORAL_TABLET | Freq: Every day | ORAL | 1 refills | Status: DC

## 2019-09-30 ENCOUNTER — Other Ambulatory Visit: Payer: Self-pay

## 2019-09-30 ENCOUNTER — Ambulatory Visit (INDEPENDENT_AMBULATORY_CARE_PROVIDER_SITE_OTHER): Payer: Medicare Other | Admitting: Nurse Practitioner

## 2019-09-30 VITALS — BP 104/62 | HR 90 | Temp 97.6°F | Resp 18 | Wt 276.4 lb

## 2019-09-30 DIAGNOSIS — J209 Acute bronchitis, unspecified: Secondary | ICD-10-CM | POA: Diagnosis not present

## 2019-09-30 DIAGNOSIS — J441 Chronic obstructive pulmonary disease with (acute) exacerbation: Secondary | ICD-10-CM | POA: Diagnosis not present

## 2019-09-30 MED ORDER — PREDNISONE 10 MG PO TABS: ORAL_TABLET | ORAL | 0 refills | Status: DC

## 2019-09-30 MED ORDER — IPRATROPIUM-ALBUTEROL 0.5-2.5 (3) MG/3ML IN SOLN
3.0000 mL | Freq: Once | RESPIRATORY_TRACT | Status: AC
  Administered 2019-09-30: 3 mL via RESPIRATORY_TRACT

## 2019-09-30 MED ORDER — LEVOFLOXACIN 500 MG PO TABS: 500.0000 mg | ORAL_TABLET | Freq: Every day | ORAL | 0 refills | Status: DC

## 2019-09-30 MED ORDER — BENZONATATE 100 MG PO CAPS: 100.0000 mg | ORAL_CAPSULE | Freq: Two times a day (BID) | ORAL | 0 refills | Status: DC | PRN

## 2019-09-30 NOTE — Progress Notes (Signed)
Established Patient Office Visit  Subjective:  Patient ID: Tim Laver., male    DOB: July 11, 1980  Age: 39 y.o. MRN: [id number]  CC:  Chief Complaint  Patient presents with  . Sore Throat    started x1 week, claritin and mucinex was taken and inhaler  . Cough    HPI Tim Raymo. is a 39 year old male presenting with symptoms predominantly cough.  He does report sore throat that he says is from coughing.  The cough started approximately 1 and half weeks again and is nonproductive.  Patient reports he has used daily Claritin and Mucinex for 1 week.  Help his symptoms but did not take away the cough.  He reports he has not had a Covid vaccine he does admit that he wears a mask whenever he is outside of his house around others do not live with him.  He has no knowledge of contact with others to have him.  He would like a Covid test today.  He does have a history of COPD using albuterol inhaler at home  He used his inhaler this morning one time.  He denies fever chills chest pain, chest tightness, loss of taste, loss of smell, general body aches, headache, nasal congestion, nasal drainage.  The patient reports that he does not have good pharmacy insurance and that is a steroid inhaler versus by mouth steroid yesterday prescribed he prefers by mouth related to him not being able to afford inhalers.  Past Medical History:  Diagnosis Date  . Avascular necrosis of bone of right hip ([place])   . Cervicalgia   . GAD (generalized anxiety disorder)   . Hypertension   . Hypertriglyceridemia   . Lumbago   . Lumbosacral spondylosis without myelopathy   . Myalgia and myositis, unspecified   . Other symptoms referable to back   . PSA (psoriatic arthritis) ([place])   . Smoker   . Thoracic spondylosis without myelopathy     Past Surgical History:  Procedure Laterality Date  . DISTAL CLAVICLE EXCISION Right 2017  . ROTATOR CUFF REPAIR  2010   right  . TONSILLECTOMY AND ADENOIDECTOMY  Age 36     Family History  Problem Relation Age of Onset  . Hypertension Mother   . Hypertension Father   . Diabetes Father   . COPD Father   . Cancer Paternal Uncle   . Cancer Maternal Grandmother   . Cancer Paternal Grandmother   . ADD / ADHD Son     Social History   Socioeconomic History  . Marital status: Single    Spouse name: Not on file  . Number of children: Not on file  . Years of education: Not on file  . Highest education level: Not on file  Occupational History  . Not on file  Tobacco Use  . Smoking status: Current Every Day Smoker    Packs/day: 1.50    Years: 10.00    Pack years: 15.00    Types: Cigarettes  . Smokeless tobacco: Never Used  Substance and Sexual Activity  . Alcohol use: Yes    Alcohol/week: 0.0 standard drinks    Comment: occasional once a month  . Drug use: No  . Sexual activity: Not Currently  Other Topics Concern  . Not on file  Social History Narrative   05/21/2012 AHW [name] was born and grew up in [place], [place]. He reports he had a good childhood. He has one sister who is 3 years younger than he. His  parents are still married. He graduated from high school and attended [employer] studying information systems for 2 years but did not turn a degree. In high school he was a member of the [employer]. 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 He has never been married he has one son who is currently 74 years old. He shares custody with his son's mother, but the patient keeps his son most of the time. He denies any current legal issues. He reports that his social support system consists of his mother, father, and sister. He currently lives with his family. 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 He reports that he believes in God but he does not practice any religion. 05/21/2012 AHW   Social Determinants of Health   Financial Resource Strain:   . Difficulty of Paying Living  Expenses:   Food Insecurity:   . Worried About Charity fundraiser in the Last Year:   . Arboriculturist in the Last Year:   Transportation Needs:   . Film/video editor (Medical):   Marland Kitchen Lack of Transportation (Non-Medical):   Physical Activity:   . Days of Exercise per Week:   . Minutes of Exercise per Session:   Stress:   . Feeling of Stress :   Social Connections:   . Frequency of Communication with Friends and Family:   . Frequency of Social Gatherings with Friends and Family:   . Attends Religious Services:   . Active Member of Clubs or Organizations:   . Attends Archivist Meetings:   Marland Kitchen Marital Status:   Intimate Partner Violence:   . Fear of Current or Ex-Partner:   . Emotionally Abused:   Marland Kitchen Physically Abused:   . Sexually Abused:     Outpatient Medications Prior to Visit  Medication Sig Dispense Refill  . albuterol (VENTOLIN HFA) 108 (90 Base) MCG/ACT inhaler INHALE 2 PUFFS INTO THE LUNGS EVERY 6 HOURS AS NEEDED FOR WHEEZE OR SHORTNESS OF BREATH 8.5 g 2  . fenofibrate (TRICOR) 145 MG tablet Take 1 tablet (145 mg total) by mouth daily. 90 tablet 1  . fluvoxaMINE (LUVOX) 100 MG tablet Take 100 mg by mouth 2 (two) times daily.     Marland Kitchen gabapentin (NEURONTIN) 300 MG capsule Take 300 mg by mouth 3 (three) times daily.   2  . lisinopril (ZESTRIL) 20 MG tablet Take 1 tablet (20 mg total) by mouth daily. 90 tablet 3  . loratadine (CLARITIN) 10 MG tablet Take 10 mg by mouth daily.    . Oxycodone HCl 10 MG TABS Take 10 mg by mouth 4 (four) times daily.     . QUEtiapine (SEROQUEL) 400 MG tablet Take 400 mg by mouth at bedtime.    Marland Kitchen tiZANidine (ZANAFLEX) 4 MG tablet Take 4 mg by mouth 3 (three) times daily.    . traZODone (DESYREL) 150 MG tablet Take by mouth at bedtime.     No facility-administered medications prior to visit.    Allergies  Allergen Reactions  . Ceclor [Cefaclor]   . Sulfa Antibiotics     ROS Review of Systems  All other systems reviewed and are  negative.     Objective:    Physical Exam  Constitutional: He is oriented to person, place, and time. He appears well-developed and well-nourished.  HENT:  Head: Normocephalic.  Right Ear: Hearing and ear canal normal.  Left Ear: Hearing and ear canal normal.  Nose: Nose normal.  Mouth/Throat: Uvula is midline, oropharynx is clear and moist and mucous membranes are normal.  Eyes: Pupils are equal, round, and reactive to light. Conjunctivae, EOM and lids are normal. Lids are everted and swept, no foreign bodies found.  Neck: No JVD present. Carotid bruit is not present.  Cardiovascular: Normal rate, regular rhythm, S1 normal, S2 normal and normal heart sounds.  Pulmonary/Chest: Effort normal. He has no decreased breath sounds.  DuoNeb administered breath sounds returned to normal limits  Abdominal: Soft. Bowel sounds are normal.  Musculoskeletal:        General: No edema. Normal range of motion.     Cervical back: Normal range of motion and neck supple.  Lymphadenopathy:       Head (right side): Tonsillar adenopathy present. No submental and no submandibular adenopathy present.       Head (left side): Tonsillar adenopathy present. No submental and no submandibular adenopathy present.    He has no cervical adenopathy.  Neurological: He is alert and oriented to person, place, and time.  Skin: Skin is warm and dry.  Psychiatric: He has a normal mood and affect. His speech is normal and behavior is normal. Judgment and thought content normal. Cognition and memory are normal.  Nursing note and vitals reviewed.   BP 104/62   Pulse 90   Temp 97.6 F (36.4 C) (Temporal)   Resp 18   Wt 276 lb 6.4 oz (125.4 kg)   SpO2 99%   BMI 42.03 kg/m  Wt Readings from Last 3 Encounters:  09/30/19 276 lb 6.4 oz (125.4 kg)  06/21/19 267 lb (121.1 kg)  03/02/19 255 lb (115.7 kg)     Health Maintenance Due  Topic Date Due  . COVID-19 Vaccine (1) Never done  . HIV Screening  Never done  .  TETANUS/TDAP  Never done    There are no preventive care reminders to display for this patient.  Lab Results  Component Value Date   TSH 3.115 05/06/2014   Lab Results  Component Value Date   WBC 5.7 06/21/2019   HGB 12.7 (L) 06/21/2019   HCT 37.1 (L) 06/21/2019   MCV 88.8 06/21/2019   PLT 252 06/21/2019   Lab Results  Component Value Date   NA 136 06/21/2019   K 4.7 06/21/2019   CO2 21 06/21/2019   GLUCOSE 98 06/21/2019   BUN 22 06/21/2019   CREATININE 1.23 06/21/2019   BILITOT 0.3 06/21/2019   ALKPHOS 61 08/28/2015   AST 18 06/21/2019   ALT 27 06/21/2019   PROT 7.2 06/21/2019   ALBUMIN 4.3 08/28/2015   CALCIUM 9.5 06/21/2019   Lab Results  Component Value Date   CHOL 155 06/21/2019   Lab Results  Component Value Date   HDL 28 (L) 06/21/2019   Lab Results  Component Value Date   LDLCALC 98 06/21/2019   Lab Results  Component Value Date   TRIG 197 (H) 06/21/2019   Lab Results  Component Value Date   CHOLHDL 5.5 (H) 06/21/2019   Lab Results  Component Value Date   HGBA1C 5.5 11/24/2018      Assessment & Plan:  Your symptoms are consistent with bronchitis, COPD exacerbation Plenty of rest, drink plenty of fluids You should use a humidifier Use your albuterol inhaler as directed No inhaled corticosteroid prescribed as you stated could not afford no samples available touch base with your pulmonologist for samples if available Prescribed by mouth prednisone, levofloxacin antibiotic, and a cough suppressant Administered nebulizer today  in clinic with improvement Covid testing in clinic today your results will be updated in MyChart which you confirmed you have access to. Problem List Items Addressed This Visit    None    Visit Diagnoses    Acute bronchitis, unspecified organism    -  Primary   Relevant Medications   ipratropium-albuterol (DUONEB) 0.5-2.5 (3) MG/3ML nebulizer solution 3 mL (Completed)   predniSONE (DELTASONE) 10 MG tablet    benzonatate (TESSALON) 100 MG capsule   levofloxacin (LEVAQUIN) 500 MG tablet   Other Relevant Orders   SARS-COV-2 RNA,(COVID-19) QUAL NAAT   COPD with acute exacerbation (HCC)       Relevant Medications   ipratropium-albuterol (DUONEB) 0.5-2.5 (3) MG/3ML nebulizer solution 3 mL (Completed)   predniSONE (DELTASONE) 10 MG tablet   benzonatate (TESSALON) 100 MG capsule   levofloxacin (LEVAQUIN) 500 MG tablet      Meds ordered this encounter  Medications  . ipratropium-albuterol (DUONEB) 0.5-2.5 (3) MG/3ML nebulizer solution 3 mL  . predniSONE (DELTASONE) 10 MG tablet    Sig: Take 6 tablet day one, take 5 tablets day two, take 4 tablets day three, take 3 tablets day four, take 3 tablets day five, take one tablet day six    Dispense:  21 tablet    Refill:  0  . benzonatate (TESSALON) 100 MG capsule    Sig: Take 1 capsule (100 mg total) by mouth 2 (two) times daily as needed for cough.    Dispense:  20 capsule    Refill:  0  . levofloxacin (LEVAQUIN) 500 MG tablet    Sig: Take 1 tablet (500 mg total) by mouth daily.    Dispense:  7 tablet    Refill:  0    Follow-up: Return if symptoms worsen or fail to improve.    Annie Main, FNP

## 2019-09-30 NOTE — Patient Instructions (Signed)
Your symptoms are consistent with bronchitis, COPD exacerbation Plenty of rest, drink plenty of fluids You should use a humidifier Use your albuterol inhaler as directed No inhaled corticosteroid prescribed as you stated could not afford no samples available touch base with your pulmonologist for samples if available Prescribed by mouth prednisone, levofloxacin antibiotic, and a cough suppressant Administered nebulizer today in clinic with improvement Covid testing in clinic today your results will be updated in MyChart which you confirmed you have access to.

## 2019-10-01 LAB — SARS-COV-2 RNA,(COVID-19) QUALITATIVE NAAT: SARS CoV2 RNA: NOT DETECTED

## 2019-10-04 NOTE — Progress Notes (Signed)
Covid negative

## 2019-10-22 ENCOUNTER — Other Ambulatory Visit: Payer: Self-pay | Admitting: Nurse Practitioner

## 2019-10-22 DIAGNOSIS — J209 Acute bronchitis, unspecified: Secondary | ICD-10-CM

## 2019-10-22 DIAGNOSIS — J441 Chronic obstructive pulmonary disease with (acute) exacerbation: Secondary | ICD-10-CM

## 2019-10-26 MED ORDER — BENZONATATE 100 MG PO CAPS: 100.0000 mg | ORAL_CAPSULE | Freq: Two times a day (BID) | ORAL | 0 refills | Status: DC | PRN

## 2019-10-27 DIAGNOSIS — M5136 Other intervertebral disc degeneration, lumbar region: Secondary | ICD-10-CM | POA: Diagnosis not present

## 2019-10-27 DIAGNOSIS — M545 Low back pain: Secondary | ICD-10-CM | POA: Diagnosis not present

## 2019-10-27 DIAGNOSIS — G894 Chronic pain syndrome: Secondary | ICD-10-CM | POA: Diagnosis not present

## 2019-11-08 ENCOUNTER — Telehealth: Payer: Self-pay | Admitting: Family Medicine

## 2019-11-08 NOTE — Progress Notes (Signed)
°  Chronic Care Management   Note  11/08/2019 Name: Tim [name]. MRN: [id number] DOB: March 18, 1981  Tim [name]. is a 39 y.o. year old male who is a primary care patient of Susy Frizzle, MD. I reached out to Tim [name]. by phone today in response to a referral sent by Mr. Tim Eakins Jr.'s PCP, Susy Frizzle, MD.   Tim [name] was given information about Chronic Care Management services today including:  1. CCM service includes personalized support from designated clinical staff supervised by his physician, including individualized plan of care and coordination with other care providers 2. 24/7 contact phone numbers for assistance for urgent and routine care needs. 3. Service will only be billed when office clinical staff spend 20 minutes or more in a month to coordinate care. 4. Only one practitioner may furnish and bill the service in a calendar month. 5. The patient may stop CCM services at any time (effective at the end of the month) by phone call to the office staff.   Patient agreed to services and verbal consent obtained.   Follow up plan:   [place]

## 2019-11-15 ENCOUNTER — Ambulatory Visit (INDEPENDENT_AMBULATORY_CARE_PROVIDER_SITE_OTHER): Payer: Medicare Other | Admitting: Family Medicine

## 2019-11-15 ENCOUNTER — Other Ambulatory Visit: Payer: Self-pay

## 2019-11-15 ENCOUNTER — Encounter: Payer: Self-pay | Admitting: Family Medicine

## 2019-11-15 VITALS — BP 108/78 | HR 78 | Temp 98.0°F | Ht 68.0 in | Wt 267.0 lb

## 2019-11-15 DIAGNOSIS — R1011 Right upper quadrant pain: Secondary | ICD-10-CM | POA: Diagnosis not present

## 2019-11-15 NOTE — Progress Notes (Signed)
Subjective:    Patient ID: Tim Penton., male    DOB: [birth year], 39 y.o.   MRN: [id number]  Patient is a very pleasant 39 year old Caucasian male who presents today with a 1 week history of worsening right upper quadrant abdominal pain.  He denies any falls or injuries that he is aware of.  He states that over the last week, he has developed near constant pain in the right upper quadrant below his nipple.  Is actually located below the rib margin.  He is not tender to palpation over the ribs posteriorly or in the mid axillary line however palpation in the right upper quadrant elicits severe pain.  He states that the pain is constant however at times he will have spasms where the pain is worse.  He states that he has been nauseated.  He does believe that eating spicy food or occasionally food itself will make the pain more intense.  He denies any fever.  There is no evidence of jaundice.  He is having to take his oxycodone more frequently due to the pain in his right upper quadrant.  He does have a daily cough however this is consistent and chronic due to his smoking.  He denies any worsening shortness of breath.  He denies any hemoptysis.  He denies any fevers or chills. Past Medical History:  Diagnosis Date  . Avascular necrosis of bone of right hip ([place])   . Cervicalgia   . GAD (generalized anxiety disorder)   . Hypertension   . Hypertriglyceridemia   . Lumbago   . Lumbosacral spondylosis without myelopathy   . Myalgia and myositis, unspecified   . Other symptoms referable to back   . PSA (psoriatic arthritis) ([place])   . Smoker   . Thoracic spondylosis without myelopathy    Past Surgical History:  Procedure Laterality Date  . DISTAL CLAVICLE EXCISION Right 2017  . ROTATOR CUFF REPAIR  2010   right  . TONSILLECTOMY AND ADENOIDECTOMY  Age 39   Current Outpatient Medications on File Prior to Visit  Medication Sig Dispense Refill  . albuterol (VENTOLIN HFA) 108 (90 Base) MCG/ACT  inhaler INHALE 2 PUFFS INTO THE LUNGS EVERY 6 HOURS AS NEEDED FOR WHEEZE OR SHORTNESS OF BREATH 8.5 g 2  . benzonatate (TESSALON) 100 MG capsule Take 1 capsule (100 mg total) by mouth 2 (two) times daily as needed for cough. 20 capsule 0  . fenofibrate (TRICOR) 145 MG tablet Take 1 tablet (145 mg total) by mouth daily. 90 tablet 1  . fluvoxaMINE (LUVOX) 100 MG tablet Take 100 mg by mouth 2 (two) times daily.     Marland Kitchen gabapentin (NEURONTIN) 300 MG capsule Take 300 mg by mouth 3 (three) times daily.   2  . levofloxacin (LEVAQUIN) 500 MG tablet Take 1 tablet (500 mg total) by mouth daily. 7 tablet 0  . lisinopril (ZESTRIL) 20 MG tablet Take 1 tablet (20 mg total) by mouth daily. 90 tablet 3  . loratadine (CLARITIN) 10 MG tablet Take 10 mg by mouth daily.    . Oxycodone HCl 10 MG TABS Take 10 mg by mouth 4 (four) times daily.     . QUEtiapine (SEROQUEL) 400 MG tablet Take 400 mg by mouth at bedtime.    Marland Kitchen tiZANidine (ZANAFLEX) 4 MG tablet Take 4 mg by mouth 3 (three) times daily.     No current facility-administered medications on file prior to visit.   Allergies  Allergen Reactions  . Ceclor [Cefaclor]   .  Sulfa Antibiotics    Social History   Socioeconomic History  . Marital status: Single    Spouse name: Not on file  . Number of children: Not on file  . Years of education: Not on file  . Highest education level: Not on file  Occupational History  . Not on file  Tobacco Use  . Smoking status: Current Every Day Smoker    Packs/day: 1.50    Years: 10.00    Pack years: 15.00    Types: Cigarettes  . Smokeless tobacco: Never Used  Substance and Sexual Activity  . Alcohol use: Yes    Alcohol/week: 0.0 standard drinks    Comment: occasional once a month  . Drug use: No  . Sexual activity: Not Currently  Other Topics Concern  . Not on file  Social History Narrative   05/21/2012 AHW [name] was born and grew up in [place], [place]. He reports he had a good childhood. He has one sister  who is 3 years younger than he. His parents are still married. He graduated from high school and attended [employer] studying information systems for 2 years but did not turn a degree. In high school he was a member of the [employer]. 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 He shares custody with his son's mother, but the patient keeps his son most of the time. He denies any current legal issues. He reports that his social support system consists of his mother, father, and sister. He currently lives with his family. 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 He reports that he believes in God but he does not practice any religion. 05/21/2012 AHW   Social Determinants of Health   Financial Resource Strain:   . Difficulty of Paying Living Expenses:   Food Insecurity:   . Worried About Charity fundraiser in the Last Year:   . Arboriculturist in the Last Year:   Transportation Needs:   . Film/video editor (Medical):   Marland Kitchen Lack of Transportation (Non-Medical):   Physical Activity:   . Days of Exercise per Week:   . Minutes of Exercise per Session:   Stress:   . Feeling of Stress :   Social Connections:   . Frequency of Communication with Friends and Family:   . Frequency of Social Gatherings with Friends and Family:   . Attends Religious Services:   . Active Member of Clubs or Organizations:   . Attends Archivist Meetings:   Marland Kitchen Marital Status:   Intimate Partner Violence:   . Fear of Current or Ex-Partner:   . Emotionally Abused:   Marland Kitchen Physically Abused:   . Sexually Abused:     Review of Systems  All other systems reviewed and are negative.      Objective:   Physical Exam Vitals reviewed.  Constitutional:      Appearance: He is obese.  Cardiovascular:     Rate and Rhythm: Normal rate and regular rhythm.  Pulmonary:      Effort: Pulmonary effort is normal.     Breath sounds: Normal breath sounds.  Chest:     Chest wall: Tenderness present.  Abdominal:     General: Bowel sounds are normal. There is no distension. There are no signs of injury.     Palpations: Abdomen is soft.     Tenderness:  There is abdominal tenderness in the right upper quadrant. There is rebound. There is no right CVA tenderness or guarding. Positive signs include Murphy's sign.    Neurological:     Mental Status: He is alert.           Assessment & Plan:  RUQ pain - Plan: US Abdomen Limited RUQ, CBC with Differential/Platelet, COMPLETE METABOLIC PANEL WITH GFR, Lipase, DG Ribs Unilateral Right  I suspect that the right upper quadrant abdominal pain is most likely biliary colic.  I explained to the patient that if he develops fevers or jaundice or intractable pain he should go to the emergency room immediately due to the potential for choledocholithiasis.  I will check a CBC, CMP, and a lipase.  Given his tenderness to palpation over the anterior ribs I will also check rib x-rays.  However I will obtain a urgent right upper quadrant ultrasound and if gallstones are present and labs are normal, I will arrange outpatient surgical consultation

## 2019-11-16 ENCOUNTER — Ambulatory Visit
Admission: RE | Admit: 2019-11-16 | Discharge: 2019-11-16 | Disposition: A | Discharge status: Home / Self Care | Payer: Medicare Other | Source: Ambulatory Visit | Attending: Family Medicine | Admitting: Family Medicine

## 2019-11-16 ENCOUNTER — Other Ambulatory Visit: Payer: Self-pay | Admitting: *Deleted

## 2019-11-16 ENCOUNTER — Other Ambulatory Visit: Payer: Self-pay | Admitting: Family Medicine

## 2019-11-16 DIAGNOSIS — R1011 Right upper quadrant pain: Secondary | ICD-10-CM

## 2019-11-16 DIAGNOSIS — K7689 Other specified diseases of liver: Secondary | ICD-10-CM | POA: Diagnosis not present

## 2019-11-16 DIAGNOSIS — R0781 Pleurodynia: Secondary | ICD-10-CM | POA: Diagnosis not present

## 2019-11-16 LAB — CBC WITH DIFFERENTIAL/PLATELET
Absolute Monocytes: 392 cells/uL (ref 200–950)
Basophils Absolute: 44 cells/uL (ref 0–200)
Basophils Relative: 0.5 %
Eosinophils Absolute: 252 cells/uL (ref 15–500)
Eosinophils Relative: 2.9 %
HCT: 38.1 % — ABNORMAL LOW (ref 38.5–50.0)
Hemoglobin: 12.6 g/dL — ABNORMAL LOW (ref 13.2–17.1)
Lymphs Abs: 2436 cells/uL (ref 850–3900)
MCH: 30.4 pg (ref 27.0–33.0)
MCHC: 33.1 g/dL (ref 32.0–36.0)
MCV: 91.8 fL (ref 80.0–100.0)
MPV: 11 fL (ref 7.5–12.5)
Monocytes Relative: 4.5 %
Neutro Abs: 5577 cells/uL (ref 1500–7800)
Neutrophils Relative %: 64.1 %
Platelets: 280 10*3/uL (ref 140–400)
RBC: 4.15 10*6/uL — ABNORMAL LOW (ref 4.20–5.80)
RDW: 12.7 % (ref 11.0–15.0)
Total Lymphocyte: 28 %
WBC: 8.7 10*3/uL (ref 3.8–10.8)

## 2019-11-16 LAB — COMPLETE METABOLIC PANEL WITH GFR
AG Ratio: 2 (calc) (ref 1.0–2.5)
ALT: 27 U/L (ref 9–46)
AST: 21 U/L (ref 10–40)
Albumin: 4.5 g/dL (ref 3.6–5.1)
Alkaline phosphatase (APISO): 49 U/L (ref 36–130)
BUN/Creatinine Ratio: 11 (calc) (ref 6–22)
BUN: 15 mg/dL (ref 7–25)
CO2: 23 mmol/L (ref 20–32)
Calcium: 9.7 mg/dL (ref 8.6–10.3)
Chloride: 104 mmol/L (ref 98–110)
Creat: 1.38 mg/dL — ABNORMAL HIGH (ref 0.60–1.35)
GFR, Est African American: 75 mL/min/{1.73_m2} (ref >=60)
GFR, Est Non African American: 65 mL/min/{1.73_m2} (ref >=60)
Globulin: 2.3 g/dL (calc) (ref 1.9–3.7)
Glucose, Bld: 89 mg/dL (ref 65–99)
Potassium: 4.4 mmol/L (ref 3.5–5.3)
Sodium: 136 mmol/L (ref 135–146)
Total Bilirubin: 0.3 mg/dL (ref 0.2–1.2)
Total Protein: 6.8 g/dL (ref 6.1–8.1)

## 2019-11-16 LAB — LIPASE: Lipase: 25 U/L (ref 7–60)

## 2019-11-18 ENCOUNTER — Other Ambulatory Visit: Payer: Medicare Other

## 2019-11-18 DIAGNOSIS — R1011 Right upper quadrant pain: Secondary | ICD-10-CM

## 2019-11-19 LAB — D-DIMER, QUANTITATIVE: D-Dimer, Quant: 0.37 mcg/mL FEU (ref <0.50)

## 2019-11-23 ENCOUNTER — Other Ambulatory Visit: Payer: Self-pay

## 2019-11-23 ENCOUNTER — Ambulatory Visit (INDEPENDENT_AMBULATORY_CARE_PROVIDER_SITE_OTHER): Payer: Medicare Other | Admitting: Family Medicine

## 2019-11-23 ENCOUNTER — Encounter: Payer: Self-pay | Admitting: Family Medicine

## 2019-11-23 VITALS — BP 128/70 | HR 66 | Temp 98.3°F | Ht 68.0 in | Wt 269.0 lb

## 2019-11-23 DIAGNOSIS — J441 Chronic obstructive pulmonary disease with (acute) exacerbation: Secondary | ICD-10-CM | POA: Diagnosis not present

## 2019-11-23 MED ORDER — PREDNISONE 20 MG PO TABS
60.0000 mg | ORAL_TABLET | Freq: Every day | ORAL | 0 refills | Status: DC
Start: 2019-11-23 — End: 2020-05-22

## 2019-11-23 MED ORDER — BUDESONIDE-FORMOTEROL FUMARATE 160-4.5 MCG/ACT IN AERO
2.0000 | INHALATION_SPRAY | Freq: Two times a day (BID) | RESPIRATORY_TRACT | 3 refills | Status: DC
Start: 2019-11-23 — End: 2020-03-17

## 2019-11-23 MED ORDER — HYDROCODONE-HOMATROPINE 5-1.5 MG/5ML PO SYRP: 5.0000 mL | ORAL_SOLUTION | Freq: Three times a day (TID) | ORAL | 0 refills | Status: DC | PRN

## 2019-11-23 NOTE — Progress Notes (Signed)
Subjective:    Patient ID: Tim [name]., male    DOB: [birth year], 39 y.o.   MRN: [id number] 11/15/19 Patient is a very pleasant 39 year old Caucasian male who presents today with a 1 week history of worsening right upper quadrant abdominal pain.  He denies any falls or injuries that he is aware of.  He states that over the last week, he has developed near constant pain in the right upper quadrant below his nipple.  Is actually located below the rib margin.  He is not tender to palpation over the ribs posteriorly or in the mid axillary line however palpation in the right upper quadrant elicits severe pain.  He states that the pain is constant however at times he will have spasms where the pain is worse.  He states that he has been nauseated.  He does believe that eating spicy food or occasionally food itself will make the pain more intense.  He denies any fever.  There is no evidence of jaundice.  He is having to take his oxycodone more frequently due to the pain in his right upper quadrant.  He does have a daily cough however this is consistent and chronic due to his smoking.  He denies any worsening shortness of breath.  He denies any hemoptysis.  He denies any fevers or chills.  At that time, my plan was: I suspect that the right upper quadrant abdominal pain is most likely biliary colic.  I explained to the patient that if he develops fevers or jaundice or intractable pain he should go to the emergency room immediately due to the potential for choledocholithiasis.  I will check a CBC, CMP, and a lipase.  Given his tenderness to palpation over the anterior ribs I will also check rib x-rays.  However I will obtain a urgent right upper quadrant ultrasound and if gallstones are present and labs are normal, I will arrange outpatient surgical consultation   11/23/19 Right upper quadrant ultrasound was negative.  Rib x-rays were negative for any fracture or skeletal abnormality.  D-dimer was normal.   Patient has been on Levaquin for my partner for bronchitis.  He continues to cough.  He continues to report pain in his right rib however it is now more of a pleuritic pain every time he coughs or breathes deeply.  I definitely believe the pain is more musculoskeletal in nature and likely due to an intercostal muscle strain.  He is smoking a pack and a half of cigarettes per day.  He is wheezing on exam.  He reports shortness of breath.  He is having to use albuterol 3 times a day. Past Medical History:  Diagnosis Date  . Avascular necrosis of bone of right hip ([place])   . Cervicalgia   . GAD (generalized anxiety disorder)   . Hypertension   . Hypertriglyceridemia   . Lumbago   . Lumbosacral spondylosis without myelopathy   . Myalgia and myositis, unspecified   . Other symptoms referable to back   . PSA (psoriatic arthritis) ([place])   . Smoker   . Thoracic spondylosis without myelopathy    Past Surgical History:  Procedure Laterality Date  . DISTAL CLAVICLE EXCISION Right 2017  . ROTATOR CUFF REPAIR  2010   right  . TONSILLECTOMY AND ADENOIDECTOMY  Age 39   Current Outpatient Medications on File Prior to Visit  Medication Sig Dispense Refill  . albuterol (VENTOLIN HFA) 108 (90 Base) MCG/ACT inhaler INHALE 2 PUFFS INTO THE LUNGS EVERY  6 HOURS AS NEEDED FOR WHEEZE OR SHORTNESS OF BREATH 8.5 g 2  . benzonatate (TESSALON) 100 MG capsule Take 1 capsule (100 mg total) by mouth 2 (two) times daily as needed for cough. 20 capsule 0  . fenofibrate (TRICOR) 145 MG tablet Take 1 tablet (145 mg total) by mouth daily. 90 tablet 1  . fluvoxaMINE (LUVOX) 100 MG tablet Take 100 mg by mouth 2 (two) times daily.     Marland Kitchen gabapentin (NEURONTIN) 300 MG capsule Take 300 mg by mouth 3 (three) times daily.   2  . lisinopril (ZESTRIL) 20 MG tablet Take 1 tablet (20 mg total) by mouth daily. 90 tablet 3  . loratadine (CLARITIN) 10 MG tablet Take 10 mg by mouth daily.    . Oxycodone HCl 10 MG TABS Take 10 mg by mouth  4 (four) times daily.     . QUEtiapine (SEROQUEL) 400 MG tablet Take 400 mg by mouth at bedtime.    Marland Kitchen tiZANidine (ZANAFLEX) 4 MG tablet Take 4 mg by mouth 3 (three) times daily.    Marland Kitchen levofloxacin (LEVAQUIN) 500 MG tablet Take 1 tablet (500 mg total) by mouth daily. 7 tablet 0   No current facility-administered medications on file prior to visit.   Allergies  Allergen Reactions  . Ceclor [Cefaclor]   . Sulfa Antibiotics    Social History   Socioeconomic History  . Marital status: Single    Spouse name: Not on file  . Number of children: Not on file  . Years of education: Not on file  . Highest education level: Not on file  Occupational History  . Not on file  Tobacco Use  . Smoking status: Current Every Day Smoker    Packs/day: 1.50    Years: 10.00    Pack years: 15.00    Types: Cigarettes  . Smokeless tobacco: Never Used  Substance and Sexual Activity  . Alcohol use: Yes    Alcohol/week: 0.0 standard drinks    Comment: occasional once a month  . Drug use: No  . Sexual activity: Not Currently  Other Topics Concern  . Not on file  Social History Narrative   05/21/2012 AHW [name] was born and grew up in [place], [place]. He reports he had a good childhood. He has one sister who is 3 years younger than he. His parents are still married. He graduated from high school and attended [employer] studying information systems for 2 years but did not turn a degree. In high school he was a member of the [employer]. 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 He has never been married he has one son who is currently 81 years old. He shares custody with his son's mother, but the patient keeps his son most of the time. He denies any current legal issues. He reports that his social support system consists of his mother, father, and sister. He currently lives with his family. 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 He reports  that he believes in God but he does not practice any religion. 05/21/2012 AHW   Social Determinants of Health   Financial Resource Strain:   . Difficulty of Paying Living Expenses:   Food Insecurity:   . Worried About Charity fundraiser in the Last Year:   . Arboriculturist in the Last Year:   Transportation Needs:   . Film/video editor (Medical):   Marland Kitchen Lack of Transportation (Non-Medical):  Physical Activity:   . Days of Exercise per Week:   . Minutes of Exercise per Session:   Stress:   . Feeling of Stress :   Social Connections:   . Frequency of Communication with Friends and Family:   . Frequency of Social Gatherings with Friends and Family:   . Attends Religious Services:   . Active Member of Clubs or Organizations:   . Attends Archivist Meetings:   Marland Kitchen Marital Status:   Intimate Partner Violence:   . Fear of Current or Ex-Partner:   . Emotionally Abused:   Marland Kitchen Physically Abused:   . Sexually Abused:     Review of Systems  All other systems reviewed and are negative.      Objective:   Physical Exam Vitals reviewed.  Constitutional:      Appearance: He is obese.  Cardiovascular:     Rate and Rhythm: Normal rate and regular rhythm.  Pulmonary:     Effort: Pulmonary effort is normal.     Breath sounds: Wheezing present. No rales.  Chest:     Chest wall: Tenderness present.  Abdominal:     General: Bowel sounds are normal. There is no distension. There are no signs of injury.     Palpations: Abdomen is soft.     Tenderness: There is abdominal tenderness in the right upper quadrant. There is no right CVA tenderness, guarding or rebound. Positive signs include Murphy's sign.    Neurological:     Mental Status: He is alert.           Assessment & Plan:  COPD with acute exacerbation (HCC)  Begin prednisone 60 mg a day for 5 days.  Add Symbicort 160/4.5, 2 puffs inhaled twice daily.  Use albuterol only as needed for wheezing.  I believe the  pain is likely due to an intercostal muscle strain from coughing.  Strongly counseled the patient to quit smoking.  Use Hycodan 1 teaspoon every 6 hours as needed for cough and for pleurisy

## 2019-11-29 ENCOUNTER — Encounter: Payer: Self-pay | Admitting: Family Medicine

## 2019-11-30 ENCOUNTER — Ambulatory Visit: Payer: Medicare Other | Admitting: Family Medicine

## 2019-12-20 DIAGNOSIS — M87051 Idiopathic aseptic necrosis of right femur: Secondary | ICD-10-CM | POA: Diagnosis not present

## 2019-12-20 DIAGNOSIS — M25552 Pain in left hip: Secondary | ICD-10-CM | POA: Diagnosis not present

## 2019-12-20 DIAGNOSIS — M87052 Idiopathic aseptic necrosis of left femur: Secondary | ICD-10-CM | POA: Diagnosis not present

## 2019-12-20 DIAGNOSIS — M25551 Pain in right hip: Secondary | ICD-10-CM | POA: Diagnosis not present

## 2020-01-04 ENCOUNTER — Other Ambulatory Visit: Payer: Self-pay | Admitting: *Deleted

## 2020-01-04 DIAGNOSIS — E785 Hyperlipidemia, unspecified: Secondary | ICD-10-CM

## 2020-01-04 DIAGNOSIS — J441 Chronic obstructive pulmonary disease with (acute) exacerbation: Secondary | ICD-10-CM

## 2020-01-04 DIAGNOSIS — I10 Essential (primary) hypertension: Secondary | ICD-10-CM

## 2020-01-04 DIAGNOSIS — M47816 Spondylosis without myelopathy or radiculopathy, lumbar region: Secondary | ICD-10-CM

## 2020-01-04 DIAGNOSIS — M47814 Spondylosis without myelopathy or radiculopathy, thoracic region: Secondary | ICD-10-CM

## 2020-01-04 DIAGNOSIS — M87051 Idiopathic aseptic necrosis of right femur: Secondary | ICD-10-CM

## 2020-01-05 ENCOUNTER — Ambulatory Visit: Payer: Medicare Other

## 2020-01-05 ENCOUNTER — Telehealth: Payer: Self-pay | Admitting: Family Medicine

## 2020-01-05 NOTE — Progress Notes (Signed)
  Chronic Care Management   Note  01/05/2020 Name: Tim [name]. MRN: [id number] DOB: 1980-11-05  Tim Penton. is a 39 y.o. year old male who is a primary care patient of Susy Frizzle, MD. I reached out to Tim Penton. by phone today in response to a referral sent by Mr. Marcas Bowsher Jr.'s PCP, Susy Frizzle, MD.   Mr. Dershem was given information about Chronic Care Management services today including:  1. CCM service includes personalized support from designated clinical staff supervised by his physician, including individualized plan of care and coordination with other care providers 2. 24/7 contact phone numbers for assistance for urgent and routine care needs. 3. Service will only be billed when office clinical staff spend 20 minutes or more in a month to coordinate care. 4. Only one practitioner may furnish and bill the service in a calendar month. 5. The patient may stop CCM services at any time (effective at the end of the month) by phone call to the office staff.   Patient wishes to consider information provided and/or speak with a member of the care team before deciding about enrollment in care management services.   Follow up plan:   Carley Perdue [employer] Scheduler

## 2020-01-06 ENCOUNTER — Telehealth: Payer: Self-pay | Admitting: Family Medicine

## 2020-01-06 NOTE — Progress Notes (Signed)
  Chronic Care Management   Outreach Note  01/06/2020 Name: Tim [name]. MRN: [id number] DOB: 01-16-1981  Referred by: Susy Frizzle, MD Reason for referral : No chief complaint on file.   A second unsuccessful telephone outreach was attempted today. The patient was referred to pharmacist for assistance with care management and care coordination.  Follow Up Plan:   Carley Perdue [employer] Scheduler

## 2020-01-07 ENCOUNTER — Telehealth: Payer: Self-pay | Admitting: Family Medicine

## 2020-01-07 NOTE — Progress Notes (Signed)
  Chronic Care Management   Note  01/07/2020 Name: Tim [name]. MRN: [id number] DOB: 29-Nov-1980  Tim Penton. is a 39 y.o. year old male who is a primary care patient of Susy Frizzle, MD. I reached out to Tim Penton. by phone today in response to a referral sent by [name]'s PCP, Susy Frizzle, MD.   Tim [name] was given information about Chronic Care Management services today including:  1. CCM service includes personalized support from designated clinical staff supervised by his physician, including individualized plan of care and coordination with other care providers 2. 24/7 contact phone numbers for assistance for urgent and routine care needs. 3. Service will only be billed when office clinical staff spend 20 minutes or more in a month to coordinate care. 4. Only one practitioner may furnish and bill the service in a calendar month. 5. The patient may stop CCM services at any time (effective at the end of the month) by phone call to the office staff.   Patient wishes to consider information provided and/or speak with a member of the care team before deciding about enrollment in care management services.   Follow up plan:   Carley Perdue [employer] Scheduler

## 2020-01-19 DIAGNOSIS — M5136 Other intervertebral disc degeneration, lumbar region: Secondary | ICD-10-CM | POA: Diagnosis not present

## 2020-01-19 DIAGNOSIS — G894 Chronic pain syndrome: Secondary | ICD-10-CM | POA: Diagnosis not present

## 2020-01-19 DIAGNOSIS — Z79891 Long term (current) use of opiate analgesic: Secondary | ICD-10-CM | POA: Diagnosis not present

## 2020-01-19 DIAGNOSIS — M5030 Other cervical disc degeneration, unspecified cervical region: Secondary | ICD-10-CM | POA: Diagnosis not present

## 2020-01-19 DIAGNOSIS — Z79899 Other long term (current) drug therapy: Secondary | ICD-10-CM | POA: Diagnosis not present

## 2020-02-20 ENCOUNTER — Other Ambulatory Visit: Payer: Self-pay | Admitting: Family Medicine

## 2020-02-20 DIAGNOSIS — J209 Acute bronchitis, unspecified: Secondary | ICD-10-CM

## 2020-02-29 ENCOUNTER — Ambulatory Visit (INDEPENDENT_AMBULATORY_CARE_PROVIDER_SITE_OTHER): Payer: Medicare Other | Admitting: Family Medicine

## 2020-02-29 ENCOUNTER — Other Ambulatory Visit: Payer: Self-pay

## 2020-02-29 DIAGNOSIS — Z23 Encounter for immunization: Secondary | ICD-10-CM

## 2020-03-13 ENCOUNTER — Other Ambulatory Visit: Payer: Self-pay | Admitting: Family Medicine

## 2020-03-17 ENCOUNTER — Other Ambulatory Visit: Payer: Self-pay | Admitting: Family Medicine

## 2020-04-03 DIAGNOSIS — Z79891 Long term (current) use of opiate analgesic: Secondary | ICD-10-CM | POA: Diagnosis not present

## 2020-04-19 DIAGNOSIS — M62830 Muscle spasm of back: Secondary | ICD-10-CM | POA: Diagnosis not present

## 2020-04-24 DIAGNOSIS — M2241 Chondromalacia patellae, right knee: Secondary | ICD-10-CM | POA: Diagnosis not present

## 2020-05-22 ENCOUNTER — Ambulatory Visit (INDEPENDENT_AMBULATORY_CARE_PROVIDER_SITE_OTHER): Payer: Medicare Other | Admitting: Nurse Practitioner

## 2020-05-22 ENCOUNTER — Encounter: Payer: Self-pay | Admitting: Nurse Practitioner

## 2020-05-22 ENCOUNTER — Encounter (HOSPITAL_COMMUNITY): Payer: Self-pay | Admitting: *Deleted

## 2020-05-22 ENCOUNTER — Emergency Department (HOSPITAL_COMMUNITY): Payer: Medicare Other

## 2020-05-22 ENCOUNTER — Other Ambulatory Visit: Payer: Self-pay

## 2020-05-22 ENCOUNTER — Emergency Department (HOSPITAL_COMMUNITY)
Admission: EM | Admit: 2020-05-22 | Discharge: 2020-05-22 | Disposition: A | Discharge status: Home / Self Care | Payer: Medicare Other | Attending: Emergency Medicine | Admitting: Emergency Medicine

## 2020-05-22 VITALS — BP 110/64 | HR 50 | Temp 98.1°F | Resp 16 | Ht 68.0 in | Wt 256.0 lb

## 2020-05-22 DIAGNOSIS — Z5321 Procedure and treatment not carried out due to patient leaving prior to being seen by health care provider: Secondary | ICD-10-CM | POA: Diagnosis not present

## 2020-05-22 DIAGNOSIS — I951 Orthostatic hypotension: Secondary | ICD-10-CM | POA: Diagnosis not present

## 2020-05-22 DIAGNOSIS — R42 Dizziness and giddiness: Secondary | ICD-10-CM | POA: Diagnosis not present

## 2020-05-22 DIAGNOSIS — R0602 Shortness of breath: Secondary | ICD-10-CM | POA: Diagnosis present

## 2020-05-22 DIAGNOSIS — I959 Hypotension, unspecified: Secondary | ICD-10-CM | POA: Insufficient documentation

## 2020-05-22 DIAGNOSIS — J449 Chronic obstructive pulmonary disease, unspecified: Secondary | ICD-10-CM | POA: Diagnosis not present

## 2020-05-22 DIAGNOSIS — R9431 Abnormal electrocardiogram [ECG] [EKG]: Secondary | ICD-10-CM | POA: Diagnosis not present

## 2020-05-22 DIAGNOSIS — R079 Chest pain, unspecified: Secondary | ICD-10-CM | POA: Diagnosis not present

## 2020-05-22 HISTORY — DX: Chronic obstructive pulmonary disease, unspecified: J44.9

## 2020-05-22 LAB — TROPONIN I (HIGH SENSITIVITY): Troponin I (High Sensitivity): 4 ng/L (ref <18)

## 2020-05-22 LAB — BASIC METABOLIC PANEL
Anion gap: 10 (ref 5–15)
BUN: 16 mg/dL (ref 6–20)
CO2: 22 mmol/L (ref 22–32)
Calcium: 9.5 mg/dL (ref 8.9–10.3)
Chloride: 100 mmol/L (ref 98–111)
Creatinine, Ser: 1.42 mg/dL — ABNORMAL HIGH (ref 0.61–1.24)
GFR, Estimated: >60 mL/min (ref >60)
Glucose, Bld: 84 mg/dL (ref 70–99)
Potassium: 4 mmol/L (ref 3.5–5.1)
Sodium: 132 mmol/L — ABNORMAL LOW (ref 135–145)

## 2020-05-22 LAB — CBC
HCT: 37.8 % — ABNORMAL LOW (ref 39.0–52.0)
Hemoglobin: 12.8 g/dL — ABNORMAL LOW (ref 13.0–17.0)
MCH: 30.2 pg (ref 26.0–34.0)
MCHC: 33.9 g/dL (ref 30.0–36.0)
MCV: 89.2 fL (ref 80.0–100.0)
Platelets: 242 10*3/uL (ref 150–400)
RBC: 4.24 MIL/uL (ref 4.22–5.81)
RDW: 12.5 % (ref 11.5–15.5)
WBC: 7.1 10*3/uL (ref 4.0–10.5)
nRBC: 0 % (ref 0.0–0.2)

## 2020-05-22 NOTE — Progress Notes (Signed)
Subjective:    Patient ID: Tim [name]., male    DOB: 1980-10-30, 40 y.o.   MRN: [id number]  HPI: Tim [name]. is a 40 y.o. male presenting for low blood pressure.  Chief Complaint  Patient presents with  . Hypotension    Measured at home 77/45 with irregular HR dizziness with standing   Has been feeling light headed at home for the past couple of weeks;  Has been less than 100/ 40-60 for the past couple of week. Does have some chest pressure and dizziness with position changes.  Does take oxycodone 4 times daily; also takes gabapentin 300 mg 3 times daily and zanaflex 4 mg three times daily.  Most of the time, takes these medications together and has been taking his medications this way for a while.   HYPERTENSION Hypertension status: over treated  Satisfied with current treatment? no Duration of hypertension: chronic BP monitoring frequency:  Few times daily BP range:  Less than 100/40s-60s; uses a wrist cuff at home BP medication side effects:  yes Medication compliance: excellent Aspirin: no Recurrent headaches: no Visual changes: no Palpitations: no Dyspnea: nothing worse than normal Chest pain: yes; pressure Lower extremity edema: no Dizzy/lightheaded: yes; only with changing positions  Allergies  Allergen Reactions  . Ceclor [Cefaclor]   . Sulfa Antibiotics     Outpatient Encounter Medications as of 05/22/2020  Medication Sig Note  . albuterol (VENTOLIN HFA) 108 (90 Base) MCG/ACT inhaler INHALE 2 PUFFS INTO THE LUNGS EVERY 6 HOURS AS NEEDED FOR WHEEZE OR SHORTNESS OF BREATH   . fenofibrate (TRICOR) 145 MG tablet TAKE 1 TABLET BY MOUTH EVERY DAY   . fluvoxaMINE (LUVOX) 100 MG tablet Take 100 mg by mouth 2 (two) times daily.  11/23/2019: Pt states he cut back to one daily  . gabapentin (NEURONTIN) 300 MG capsule Take 300 mg by mouth 3 (three) times daily.    Marland Kitchen lisinopril (ZESTRIL) 20 MG tablet Take 1 tablet (20 mg total) by mouth daily.   Marland Kitchen loratadine  (CLARITIN) 10 MG tablet Take 10 mg by mouth daily.   . Oxycodone HCl 10 MG TABS Take 10 mg by mouth 4 (four) times daily.    . QUEtiapine (SEROQUEL) 400 MG tablet Take 400 mg by mouth at bedtime.   . SYMBICORT 160-4.5 MCG/ACT inhaler TAKE 2 PUFFS BY MOUTH TWICE A DAY   . tiZANidine (ZANAFLEX) 4 MG tablet Take 4 mg by mouth 3 (three) times daily. 11/23/2019: Pt states 6mg  one tid  . [DISCONTINUED] benzonatate (TESSALON) 100 MG capsule Take 1 capsule (100 mg total) by mouth 2 (two) times daily as needed for cough.   . [DISCONTINUED] HYDROcodone-homatropine (HYCODAN) 5-1.5 MG/5ML syrup Take 5 mLs by mouth every 8 (eight) hours as needed for cough.   . [DISCONTINUED] levofloxacin (LEVAQUIN) 500 MG tablet Take 1 tablet (500 mg total) by mouth daily.   . [DISCONTINUED] predniSONE (DELTASONE) 20 MG tablet Take 3 tablets (60 mg total) by mouth daily with breakfast.    No facility-administered encounter medications on file as of 05/22/2020.    Patient Active Problem List   Diagnosis Date Noted  . COPD with chronic bronchitis and emphysema ([place]) 10/22/2017  . Nocturnal hypoxia 10/22/2017  . Hypertriglyceridemia   . Smoker   . Lumbar spondylosis 07/22/2011  . Thoracic spondylosis 07/22/2011  . Myalgia and myositis, unspecified 07/22/2011  . PSA (psoriatic arthritis) ([place]) 10/18/2010  . HTN (hypertension) 10/18/2010  . GAD (generalized anxiety disorder) 10/18/2010  Past Medical History:  Diagnosis Date  . Avascular necrosis of bone of right hip ([place])   . Cervicalgia   . GAD (generalized anxiety disorder)   . Hypertension   . Hypertriglyceridemia   . Lumbago   . Lumbosacral spondylosis without myelopathy   . Myalgia and myositis, unspecified   . Other symptoms referable to back   . PSA (psoriatic arthritis) ([place])   . Smoker   . Thoracic spondylosis without myelopathy     Relevant past medical, surgical, family and social history reviewed and updated as indicated. Interim medical history  since our last visit reviewed.  Review of Systems  Constitutional: Negative.  Negative for activity change, appetite change, fatigue and fever.  Eyes: Negative.  Negative for visual disturbance.  Respiratory: Negative for cough, chest tightness, shortness of breath and wheezing.   Cardiovascular: Positive for chest pain and palpitations. Negative for leg swelling.  Musculoskeletal: Negative.   Skin: Negative.   Neurological: Positive for dizziness and light-headedness. Negative for weakness and headaches.  Psychiatric/Behavioral: Negative.     Per HPI unless specifically indicated above     Objective:    BP 110/64   Pulse (!) 50   Temp 98.1 F (36.7 C) (Temporal)   Resp 16   Ht 5\' 8"  (1.727 m)   Wt 256 lb (116.1 kg)   SpO2 98%   BMI 38.92 kg/m   Wt Readings from Last 3 Encounters:  05/22/20 256 lb (116.1 kg)  11/23/19 (!) 269 lb (122 kg)  11/15/19 267 lb (121.1 kg)    Physical Exam Vitals and nursing note reviewed.  Constitutional:      General: He is not in acute distress.    Appearance: Normal appearance. He is not toxic-appearing.  HENT:     Head: Normocephalic and atraumatic.  Cardiovascular:     Rate and Rhythm: Bradycardia present.     Heart sounds: No murmur heard.   Pulmonary:     Effort: Pulmonary effort is normal. No respiratory distress.     Breath sounds: No wheezing, rhonchi or rales.  Skin:    General: Skin is warm and dry.     Coloration: Skin is not jaundiced or pale.     Findings: No erythema.  Neurological:     General: No focal deficit present.     Mental Status: He is alert and oriented to person, place, and time.     Motor: No weakness.     Gait: Gait normal.  Psychiatric:        Mood and Affect: Mood normal.        Behavior: Behavior normal.        Thought Content: Thought content normal.        Judgment: Judgment normal.        Assessment & Plan:  1. Orthostatic hypotension Acute, ongoing.  EKG obtained in office today given  chest pressure and low heart rate.  Showed HR 46 with incomplete right bundle branch block and negative T-waves.  BP today normal in office.  Given abnormal EKG with chest pressure and dizziness, encouraged patient to go to ED emergently.  Offered calling emergent transportation; patient declined and stated his mother would take him there immediately.    Bradycardia may be related to medication interaction vs. Acute coronary ischemia and needs further work up.  - EKG 12-Lead   Follow up plan: Return in about 1 week (around 05/29/2020) for f/u with Dr. Dennard Schaumann.

## 2020-05-22 NOTE — ED Notes (Signed)
Pt decided to leave. Did not want to wait.

## 2020-05-22 NOTE — ED Triage Notes (Addendum)
Pt reports mid chest pain for over the past week. Reports having copd with mild sob. Went to pcp and states he was sent due to hypotension and abnormal EKG. takes bp meds and took it this am.

## 2020-05-23 ENCOUNTER — Encounter: Payer: Self-pay | Admitting: Family Medicine

## 2020-05-30 ENCOUNTER — Encounter: Payer: Self-pay | Admitting: Family Medicine

## 2020-05-30 ENCOUNTER — Ambulatory Visit (INDEPENDENT_AMBULATORY_CARE_PROVIDER_SITE_OTHER): Payer: Medicare Other | Admitting: Family Medicine

## 2020-05-30 ENCOUNTER — Other Ambulatory Visit: Payer: Self-pay

## 2020-05-30 VITALS — BP 90/58 | HR 88 | Ht 68.0 in | Wt 254.0 lb

## 2020-05-30 DIAGNOSIS — I951 Orthostatic hypotension: Secondary | ICD-10-CM | POA: Diagnosis not present

## 2020-05-30 DIAGNOSIS — Z79899 Other long term (current) drug therapy: Secondary | ICD-10-CM

## 2020-05-30 DIAGNOSIS — R42 Dizziness and giddiness: Secondary | ICD-10-CM | POA: Diagnosis not present

## 2020-05-30 DIAGNOSIS — J209 Acute bronchitis, unspecified: Secondary | ICD-10-CM

## 2020-05-30 NOTE — Progress Notes (Signed)
Subjective:    Patient ID: Tim [name]., male    DOB: [birth year], 40 y.o.   MRN: [id number]  Patient is here today for follow-up.  He recently saw my partner due to dizziness and lightheadedness.  He was also having chest tightness at the time.  She performed an EKG which showed bradycardia.  He also had T wave inversions in V1 through V3.  Therefore she referred him to the emergency room where troponins were negative.  EKGs remained stable with persistent T wave inversions.  Patient left without being seen.  He states that he has not had any angina or chest pain since.  He believes the chest tightness may have been reflux.  However I do believe the bradycardia and dizziness is likely due to polypharmacy.  Sees a pain specialist.  He is on oxycodone, tizanidine, and gabapentin.  He takes all of these medication.  He also is on lisinopril and his blood pressure today is very low.  Has been checking his blood pressure at home and at home has been between 105 and 143/53-93 but the vast majority are 05/20/1978.  His heart rate at home has been 53-92 but the vast majority are 63-80.  Past Medical History:  Diagnosis Date  . Avascular necrosis of bone of right hip ([place])   . Cervicalgia   . COPD (chronic obstructive pulmonary disease) ([place])   . GAD (generalized anxiety disorder)   . Hypertension   . Hypertriglyceridemia   . Lumbago   . Lumbosacral spondylosis without myelopathy   . Myalgia and myositis, unspecified   . Other symptoms referable to back   . PSA (psoriatic arthritis) ([place])   . Smoker   . Thoracic spondylosis without myelopathy    Past Surgical History:  Procedure Laterality Date  . DISTAL CLAVICLE EXCISION Right 2017  . ROTATOR CUFF REPAIR  2010   right  . TONSILLECTOMY AND ADENOIDECTOMY  Age 19   Current Outpatient Medications on File Prior to Visit  Medication Sig Dispense Refill  . albuterol (VENTOLIN HFA) 108 (90 Base) MCG/ACT inhaler INHALE 2 PUFFS INTO THE LUNGS EVERY  6 HOURS AS NEEDED FOR WHEEZE OR SHORTNESS OF BREATH 8.5 each 2  . fenofibrate (TRICOR) 145 MG tablet TAKE 1 TABLET BY MOUTH EVERY DAY 90 tablet 1  . fluvoxaMINE (LUVOX) 100 MG tablet Take 100 mg by mouth 2 (two) times daily.     Marland Kitchen gabapentin (NEURONTIN) 300 MG capsule Take 300 mg by mouth 3 (three) times daily.   2  . lisinopril (ZESTRIL) 20 MG tablet Take 1 tablet (20 mg total) by mouth daily. 90 tablet 3  . loratadine (CLARITIN) 10 MG tablet Take 10 mg by mouth daily.    . Oxycodone HCl 10 MG TABS Take 10 mg by mouth 4 (four) times daily.     . QUEtiapine (SEROQUEL) 400 MG tablet Take 400 mg by mouth at bedtime.    . SYMBICORT 160-4.5 MCG/ACT inhaler TAKE 2 PUFFS BY MOUTH TWICE A DAY 10.2 each 3  . tiZANidine (ZANAFLEX) 4 MG tablet Take 4 mg by mouth 3 (three) times daily.     No current facility-administered medications on file prior to visit.   Allergies  Allergen Reactions  . Ceclor [Cefaclor]   . Sulfa Antibiotics    Social History   Socioeconomic History  . Marital status: Single    Spouse name: Not on file  . Number of children: Not on file  . Years of education: Not on  file  . Highest education level: Not on file  Occupational History  . Not on file  Tobacco Use  . Smoking status: Current Every Day Smoker    Packs/day: 1.50    Years: 10.00    Pack years: 15.00    Types: Cigarettes  . Smokeless tobacco: Never Used  Substance and Sexual Activity  . Alcohol use: Yes    Alcohol/week: 0.0 standard drinks    Comment: occasional once a month  . Drug use: No  . Sexual activity: Not Currently  Other Topics Concern  . Not on file  Social History Narrative   05/21/2012 AHW [name] was born and grew up in [place], [place]. He reports he had a good childhood. He has one sister who is 3 years younger than he. His parents are still married. He graduated from high school and attended [employer] studying information systems for 2 years but did not turn a  degree. In high school he was a member of the [employer]. 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 He has never been married he has one son who is currently 25 years old. He shares custody with his son's mother, but the patient keeps his son most of the time. He denies any current legal issues. He reports that his social support system consists of his mother, father, and sister. He currently lives with his family. 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 He reports that he believes in God but he does not practice any religion. 05/21/2012 AHW   Social Determinants of Health   Financial Resource Strain: Not on file  Food Insecurity: Not on file  Transportation Needs: Not on file  Physical Activity: Not on file  Stress: Not on file  Social Connections: Not on file  Intimate Partner Violence: Not on file    Review of Systems  All other systems reviewed and are negative.      Objective:   Physical Exam Vitals reviewed.  Constitutional:      Appearance: He is obese.  Cardiovascular:     Rate and Rhythm: Normal rate and regular rhythm.  Pulmonary:     Effort: Pulmonary effort is normal.     Breath sounds: Wheezing present. No rales.  Chest:     Chest wall: No tenderness.  Abdominal:     General: Bowel sounds are normal. There is no distension. There are no signs of injury.     Palpations: Abdomen is soft.     Tenderness: There is no abdominal tenderness. There is no right CVA tenderness, guarding or rebound.  Neurological:     Mental Status: He is alert.           Assessment & Plan:  Orthostatic hypotension  Dizziness  Polypharmacy  I believe his dizziness was due to polypharmacy and orthostatic hypotension.  We will reduce the lisinopril and have him cut the pill in half and monitor his blood pressure at home.  However I have recommended that he try to reduce his use of tizanidine, gabapentin, and oxycodone.  I believe  the interplay of those medications are what is causing his dizziness and lightheadedness.  He has tried reducing the medication himself and states that he has noticed an improvement since he is taking less.  He denies any angina.  We discussed a referral to cardiology for a stress test however he does not feel that that is necessary at this time.  He will call me  if he develops recurrent chest pain.

## 2020-05-31 MED ORDER — BUDESONIDE-FORMOTEROL FUMARATE 160-4.5 MCG/ACT IN AERO: INHALATION_SPRAY | RESPIRATORY_TRACT | 3 refills | Status: DC

## 2020-05-31 MED ORDER — ALBUTEROL SULFATE HFA 108 (90 BASE) MCG/ACT IN AERS: 2.0000 | INHALATION_SPRAY | Freq: Four times a day (QID) | RESPIRATORY_TRACT | 3 refills | Status: DC | PRN

## 2020-05-31 MED ORDER — LISINOPRIL 10 MG PO TABS: 10.0000 mg | ORAL_TABLET | Freq: Every day | ORAL | 3 refills | Status: DC

## 2020-06-12 ENCOUNTER — Encounter: Payer: Self-pay | Admitting: Family Medicine

## 2020-07-19 DIAGNOSIS — M546 Pain in thoracic spine: Secondary | ICD-10-CM | POA: Diagnosis not present

## 2020-07-19 DIAGNOSIS — M542 Cervicalgia: Secondary | ICD-10-CM | POA: Diagnosis not present

## 2020-08-30 ENCOUNTER — Other Ambulatory Visit: Payer: Self-pay | Admitting: Family Medicine

## 2020-09-12 DIAGNOSIS — M47814 Spondylosis without myelopathy or radiculopathy, thoracic region: Secondary | ICD-10-CM | POA: Diagnosis not present

## 2020-10-10 DIAGNOSIS — M5136 Other intervertebral disc degeneration, lumbar region: Secondary | ICD-10-CM | POA: Diagnosis not present

## 2020-11-13 ENCOUNTER — Encounter: Payer: Self-pay | Admitting: Family Medicine

## 2020-11-13 ENCOUNTER — Telehealth: Payer: Self-pay

## 2020-11-13 ENCOUNTER — Telehealth: Payer: Self-pay | Admitting: Family Medicine

## 2020-11-13 MED ORDER — NIRMATRELVIR/RITONAVIR (PAXLOVID)TABLET: 3.0000 | ORAL_TABLET | Freq: Two times a day (BID) | ORAL | 0 refills | Status: AC

## 2020-11-13 NOTE — Telephone Encounter (Signed)
Please see prior message.   

## 2020-11-13 NOTE — Telephone Encounter (Signed)
Pt called in stating that he tested positive for COVID, and pt states that he is experiencing some coughing and wheezing which is concerned about. Pt also states that he running a slight fever as well. Pt just wants so advice as what to do, or is there anything that he could be taking to help with symptoms. Please advise.  Cb#: [phone]

## 2020-12-26 DIAGNOSIS — M87852 Other osteonecrosis, left femur: Secondary | ICD-10-CM | POA: Diagnosis not present

## 2020-12-26 DIAGNOSIS — M87851 Other osteonecrosis, right femur: Secondary | ICD-10-CM | POA: Diagnosis not present

## 2021-01-08 DIAGNOSIS — M87051 Idiopathic aseptic necrosis of right femur: Secondary | ICD-10-CM | POA: Diagnosis not present

## 2021-01-08 DIAGNOSIS — M5030 Other cervical disc degeneration, unspecified cervical region: Secondary | ICD-10-CM | POA: Diagnosis not present

## 2021-01-08 DIAGNOSIS — M791 Myalgia, unspecified site: Secondary | ICD-10-CM | POA: Diagnosis not present

## 2021-02-20 ENCOUNTER — Other Ambulatory Visit: Payer: Self-pay

## 2021-02-20 ENCOUNTER — Encounter: Payer: Self-pay | Admitting: Family Medicine

## 2021-02-20 ENCOUNTER — Ambulatory Visit (INDEPENDENT_AMBULATORY_CARE_PROVIDER_SITE_OTHER): Payer: Medicare Other | Admitting: Family Medicine

## 2021-02-20 ENCOUNTER — Other Ambulatory Visit: Payer: Self-pay | Admitting: Family Medicine

## 2021-02-20 VITALS — BP 112/82 | HR 86 | Temp 98.7°F | Resp 18 | Ht 68.0 in | Wt 240.0 lb

## 2021-02-20 DIAGNOSIS — J329 Chronic sinusitis, unspecified: Secondary | ICD-10-CM

## 2021-02-20 DIAGNOSIS — J441 Chronic obstructive pulmonary disease with (acute) exacerbation: Secondary | ICD-10-CM

## 2021-02-20 DIAGNOSIS — J310 Chronic rhinitis: Secondary | ICD-10-CM

## 2021-02-20 DIAGNOSIS — E7800 Pure hypercholesterolemia, unspecified: Secondary | ICD-10-CM

## 2021-02-20 MED ORDER — HYDROCODONE BIT-HOMATROP MBR 5-1.5 MG/5ML PO SOLN: 5.0000 mL | Freq: Three times a day (TID) | ORAL | 0 refills | Status: DC | PRN

## 2021-02-20 MED ORDER — PREDNISONE 20 MG PO TABS: ORAL_TABLET | ORAL | 0 refills | Status: DC

## 2021-02-20 MED ORDER — AMOXICILLIN-POT CLAVULANATE 875-125 MG PO TABS: 1.0000 | ORAL_TABLET | Freq: Two times a day (BID) | ORAL | 0 refills | Status: DC

## 2021-02-20 NOTE — Progress Notes (Signed)
Subjective:    Patient ID: Tim Penton., male    DOB: [birth year], 40 y.o.   MRN: [id number]  Patient states that he has been sick for approximately a week.  Symptoms include cough, chest congestion, wheezing, purulent sputum.  He is coughing up green and brown and occasionally bloody sputum.  He reports tightness in his chest and wheezing.  At times he feels like he cannot catch his breath.  He is taken 2 COVID test that are negative.  He also complains of pain and pressure in his frontal sinuses bilaterally.  He is blowing out brown and yellow mucus. Past Medical History:  Diagnosis Date   Avascular necrosis of bone of right hip (HCC)    Cervicalgia    COPD (chronic obstructive pulmonary disease) (HCC)    GAD (generalized anxiety disorder)    Hypertension    Hypertriglyceridemia    Lumbago    Lumbosacral spondylosis without myelopathy    Myalgia and myositis, unspecified    Other symptoms referable to back    PSA (psoriatic arthritis) ([place])    Smoker    Thoracic spondylosis without myelopathy    Past Surgical History:  Procedure Laterality Date   DISTAL CLAVICLE EXCISION Right 2017   ROTATOR CUFF REPAIR  2010   right   TONSILLECTOMY AND ADENOIDECTOMY  Age 72   Current Outpatient Medications on File Prior to Visit  Medication Sig Dispense Refill   albuterol (VENTOLIN HFA) 108 (90 Base) MCG/ACT inhaler Inhale 2 puffs into the lungs every 6 (six) hours as needed for wheezing or shortness of breath. 3 each 3   budesonide-formoterol (SYMBICORT) 160-4.5 MCG/ACT inhaler TAKE 2 PUFFS BY MOUTH TWICE A DAY 3 each 3   fenofibrate (TRICOR) 145 MG tablet TAKE 1 TABLET BY MOUTH EVERY DAY 90 tablet 1   fluvoxaMINE (LUVOX) 100 MG tablet Take 100 mg by mouth at bedtime.     gabapentin (NEURONTIN) 300 MG capsule Take 300 mg by mouth 3 (three) times daily.   2   lisinopril (ZESTRIL) 10 MG tablet Take 1 tablet (10 mg total) by mouth daily. 90 tablet 3   loratadine (CLARITIN) 10 MG tablet Take  10 mg by mouth daily.     Oxycodone HCl 10 MG TABS Take 10 mg by mouth 4 (four) times daily.      QUEtiapine (SEROQUEL) 400 MG tablet Take 400 mg by mouth at bedtime.     tiZANidine (ZANAFLEX) 4 MG tablet Take 4 mg by mouth 3 (three) times daily.     traZODone (DESYREL) 100 MG tablet Take 100 mg by mouth at bedtime.     No current facility-administered medications on file prior to visit.   Allergies  Allergen Reactions   Ceclor [Cefaclor]    Sulfa Antibiotics    Social History   Socioeconomic History   Marital status: Single    Spouse name: Not on file   Number of children: Not on file   Years of education: Not on file   Highest education level: Not on file  Occupational History   Not on file  Tobacco Use   Smoking status: Every Day    Packs/day: 1.50    Years: 10.00    Pack years: 15.00    Types: Cigarettes   Smokeless tobacco: Never  Substance and Sexual Activity   Alcohol use: Yes    Alcohol/week: 0.0 standard drinks    Comment: occasional once a month   Drug use: No   Sexual activity: Not  Currently  Other Topics Concern   Not on file  Social History Narrative   05/21/2012 AHW [name] was born and grew up in [place], [place]. He reports he had a good childhood. He has one sister who is 3 years younger than he. His parents are still married. He graduated from high school and attended [employer] studying information systems for 2 years but did not turn a degree. In high school he was a member of the [employer]. 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 He has never been married he has one son who is currently 58 years old. He shares custody with his son's mother, but the patient keeps his son most of the time. He denies any current legal issues. He reports that his social support system consists of his mother, father, and sister. He currently lives with his family. 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  He reports that he believes in God but he does not practice any religion. 05/21/2012 AHW   Social Determinants of Health   Financial Resource Strain: Not on file  Food Insecurity: Not on file  Transportation Needs: Not on file  Physical Activity: Not on file  Stress: Not on file  Social Connections: Not on file  Intimate Partner Violence: Not on file    Review of Systems  All other systems reviewed and are negative.     Objective:   Physical Exam Vitals reviewed.  Constitutional:      Appearance: He is obese.  HENT:     Nose: Congestion present.     Right Sinus: Frontal sinus tenderness present.     Left Sinus: Frontal sinus tenderness present.  Cardiovascular:     Rate and Rhythm: Normal rate and regular rhythm.  Pulmonary:     Effort: Pulmonary effort is normal.     Breath sounds: Decreased air movement present. Decreased breath sounds and wheezing present. No rhonchi or rales.  Chest:     Chest wall: Tenderness present.  Abdominal:     General: Bowel sounds are normal. There are no signs of injury.     Palpations: Abdomen is soft.  Neurological:     Mental Status: He is alert.          Assessment & Plan:  COPD with acute exacerbation ([place])  Rhinosinusitis I believe the patient has a bacterial sinus infection coupled with a COPD exacerbation.  Begin Augmentin 875 mg twice daily for 10 days to cover for sinusitis as well as possible walking pneumonia.  Add prednisone taper pack for wheezing and bronchospasm.  Add albuterol 2 puffs inhaled every 4-6 hours as needed for wheezing.  Seek medical attention immediately if worsening.

## 2021-02-21 ENCOUNTER — Other Ambulatory Visit: Payer: Self-pay | Admitting: Family Medicine

## 2021-02-21 DIAGNOSIS — J209 Acute bronchitis, unspecified: Secondary | ICD-10-CM

## 2021-03-07 ENCOUNTER — Other Ambulatory Visit: Payer: Self-pay

## 2021-03-07 ENCOUNTER — Ambulatory Visit (INDEPENDENT_AMBULATORY_CARE_PROVIDER_SITE_OTHER): Payer: Medicare Other | Admitting: *Deleted

## 2021-03-07 ENCOUNTER — Other Ambulatory Visit: Payer: Medicare Other

## 2021-03-07 DIAGNOSIS — Z23 Encounter for immunization: Secondary | ICD-10-CM | POA: Diagnosis not present

## 2021-03-07 DIAGNOSIS — E7800 Pure hypercholesterolemia, unspecified: Secondary | ICD-10-CM | POA: Diagnosis not present

## 2021-03-08 LAB — CBC WITH DIFFERENTIAL/PLATELET
Absolute Monocytes: 375 cells/uL (ref 200–950)
Basophils Absolute: 30 cells/uL (ref 0–200)
Basophils Relative: 0.4 %
Eosinophils Absolute: 188 cells/uL (ref 15–500)
Eosinophils Relative: 2.5 %
HCT: 40 % (ref 38.5–50.0)
Hemoglobin: 13.3 g/dL (ref 13.2–17.1)
Lymphs Abs: 1973 cells/uL (ref 850–3900)
MCH: 30.1 pg (ref 27.0–33.0)
MCHC: 33.3 g/dL (ref 32.0–36.0)
MCV: 90.5 fL (ref 80.0–100.0)
MPV: 11 fL (ref 7.5–12.5)
Monocytes Relative: 5 %
Neutro Abs: 4935 cells/uL (ref 1500–7800)
Neutrophils Relative %: 65.8 %
Platelets: 250 10*3/uL (ref 140–400)
RBC: 4.42 10*6/uL (ref 4.20–5.80)
RDW: 12.5 % (ref 11.0–15.0)
Total Lymphocyte: 26.3 %
WBC: 7.5 10*3/uL (ref 3.8–10.8)

## 2021-03-08 LAB — COMPLETE METABOLIC PANEL WITH GFR
AG Ratio: 2 (calc) (ref 1.0–2.5)
ALT: 21 U/L (ref 9–46)
AST: 18 U/L (ref 10–40)
Albumin: 4.7 g/dL (ref 3.6–5.1)
Alkaline phosphatase (APISO): 41 U/L (ref 36–130)
BUN/Creatinine Ratio: 16 (calc) (ref 6–22)
BUN: 21 mg/dL (ref 7–25)
CO2: 19 mmol/L — ABNORMAL LOW (ref 20–32)
Calcium: 9.5 mg/dL (ref 8.6–10.3)
Chloride: 104 mmol/L (ref 98–110)
Creat: 1.3 mg/dL — ABNORMAL HIGH (ref 0.60–1.26)
Globulin: 2.3 g/dL (calc) (ref 1.9–3.7)
Glucose, Bld: 96 mg/dL (ref 65–99)
Potassium: 4.5 mmol/L (ref 3.5–5.3)
Sodium: 137 mmol/L (ref 135–146)
Total Bilirubin: 0.3 mg/dL (ref 0.2–1.2)
Total Protein: 7 g/dL (ref 6.1–8.1)
eGFR: 72 mL/min/{1.73_m2} (ref >=60)

## 2021-03-08 LAB — LIPID PANEL
Cholesterol: 183 mg/dL (ref <200)
HDL: 40 mg/dL (ref >=40)
LDL Cholesterol (Calc): 117 mg/dL (calc) — ABNORMAL HIGH
Non-HDL Cholesterol (Calc): 143 mg/dL (calc) — ABNORMAL HIGH (ref <130)
Total CHOL/HDL Ratio: 4.6 (calc) (ref <5.0)
Triglycerides: 149 mg/dL (ref <150)

## 2021-03-13 ENCOUNTER — Other Ambulatory Visit: Payer: Medicare Other

## 2021-03-20 ENCOUNTER — Encounter: Payer: Medicare Other | Admitting: Family Medicine

## 2021-03-24 DIAGNOSIS — M5412 Radiculopathy, cervical region: Secondary | ICD-10-CM | POA: Diagnosis not present

## 2021-03-24 DIAGNOSIS — M5414 Radiculopathy, thoracic region: Secondary | ICD-10-CM | POA: Diagnosis not present

## 2021-04-03 DIAGNOSIS — Z5181 Encounter for therapeutic drug level monitoring: Secondary | ICD-10-CM | POA: Diagnosis not present

## 2021-04-03 DIAGNOSIS — M5459 Other low back pain: Secondary | ICD-10-CM | POA: Diagnosis not present

## 2021-04-03 DIAGNOSIS — M5030 Other cervical disc degeneration, unspecified cervical region: Secondary | ICD-10-CM | POA: Diagnosis not present

## 2021-04-03 DIAGNOSIS — M5136 Other intervertebral disc degeneration, lumbar region: Secondary | ICD-10-CM | POA: Diagnosis not present

## 2021-04-03 DIAGNOSIS — Z79891 Long term (current) use of opiate analgesic: Secondary | ICD-10-CM | POA: Diagnosis not present

## 2021-04-03 DIAGNOSIS — G894 Chronic pain syndrome: Secondary | ICD-10-CM | POA: Diagnosis not present

## 2021-04-10 ENCOUNTER — Other Ambulatory Visit: Payer: Self-pay

## 2021-04-10 ENCOUNTER — Ambulatory Visit (INDEPENDENT_AMBULATORY_CARE_PROVIDER_SITE_OTHER): Payer: Medicare Other | Admitting: Family Medicine

## 2021-04-10 ENCOUNTER — Encounter: Payer: Self-pay | Admitting: Family Medicine

## 2021-04-10 VITALS — BP 104/70 | HR 71 | Ht 68.0 in | Wt 245.0 lb

## 2021-04-10 DIAGNOSIS — I10 Essential (primary) hypertension: Secondary | ICD-10-CM

## 2021-04-10 DIAGNOSIS — Z23 Encounter for immunization: Secondary | ICD-10-CM | POA: Diagnosis not present

## 2021-04-10 DIAGNOSIS — Z0000 Encounter for general adult medical examination without abnormal findings: Secondary | ICD-10-CM

## 2021-04-10 DIAGNOSIS — E781 Pure hyperglyceridemia: Secondary | ICD-10-CM

## 2021-04-10 DIAGNOSIS — E785 Hyperlipidemia, unspecified: Secondary | ICD-10-CM

## 2021-04-10 DIAGNOSIS — E7800 Pure hypercholesterolemia, unspecified: Secondary | ICD-10-CM

## 2021-04-10 DIAGNOSIS — F17200 Nicotine dependence, unspecified, uncomplicated: Secondary | ICD-10-CM | POA: Diagnosis not present

## 2021-04-10 DIAGNOSIS — Z0001 Encounter for general adult medical examination with abnormal findings: Secondary | ICD-10-CM

## 2021-04-10 DIAGNOSIS — M87051 Idiopathic aseptic necrosis of right femur: Secondary | ICD-10-CM

## 2021-04-10 MED ORDER — KETOCONAZOLE 2 % EX CREA: 1.0000 "application " | TOPICAL_CREAM | Freq: Every day | CUTANEOUS | 3 refills | Status: DC

## 2021-04-10 NOTE — Progress Notes (Signed)
Subjective:    Patient ID: Tim [name]., male    DOB: 01-Nov-1980, 40 y.o.   MRN: [id number]  Patient is a very pleasant 40 year old Caucasian male who presents today for complete physical exam.  He has a history of avascular necrosis of the right knee, degenerative disc disease in the cervical thoracic and lumbar spine and polyarthralgias for which he sees [facility].  He also has history of chronic kidney disease and dyslipidemia.  He is not yet due for a colonoscopy or prostate cancer screening.  He is due for the bivalent COVID-vaccine.  He is also due for a tetanus shot.  His most recent lab work is listed below No visits with results within 1 Month(s) from this visit.  Latest known visit with results is:  Appointment on 03/07/2021  Component Date Value Ref Range Status   Cholesterol 03/07/2021 183  <200 mg/dL Final   HDL 03/07/2021 40  > OR = 40 mg/dL Final   Triglycerides 03/07/2021 149  <150 mg/dL Final   LDL Cholesterol (Calc) 03/07/2021 117 (H)  mg/dL (calc) Final   Comment: Reference range: <100 . Desirable range <100 mg/dL for primary prevention;   <70 mg/dL for patients with CHD or diabetic patients  with > or = 2 CHD risk factors. Marland Kitchen LDL-C is now calculated using the Martin-Hopkins  calculation, which is a validated novel method providing  better accuracy than the Friedewald equation in the  estimation of LDL-C.  Cresenciano Genre et al. Annamaria Helling. 1324;401(02): 2061-2068  (http://education.[facility].com/faq/FAQ164)    Total CHOL/HDL Ratio 03/07/2021 4.6  <5.0 (calc) Final   Non-HDL Cholesterol (Calc) 03/07/2021 143 (H)  <130 mg/dL (calc) Final   Comment: For patients with diabetes plus 1 major ASCVD risk  factor, treating to a non-HDL-C goal of <100 mg/dL  (LDL-C of <70 mg/dL) is considered a therapeutic  option.    Glucose, Bld 03/07/2021 96  65 - 99 mg/dL Final   Comment: .            Fasting reference interval .    BUN 03/07/2021 21  7 - 25 mg/dL Final   Creat  03/07/2021 1.30 (H)  0.60 - 1.26 mg/dL Final   eGFR 03/07/2021 72  > OR = 60 mL/min/1.85m2 Final   Comment: The eGFR is based on the CKD-EPI 2021 equation. To calculate  the new eGFR from a previous Creatinine or Cystatin C result, go to https://www.kidney.org/professionals/ kdoqi/gfr%5Fcalculator    BUN/Creatinine Ratio 03/07/2021 16  6 - 22 (calc) Final   Sodium 03/07/2021 137  135 - 146 mmol/L Final   Potassium 03/07/2021 4.5  3.5 - 5.3 mmol/L Final   Chloride 03/07/2021 104  98 - 110 mmol/L Final   CO2 03/07/2021 19 (L)  20 - 32 mmol/L Final   Calcium 03/07/2021 9.5  8.6 - 10.3 mg/dL Final   Total Protein 03/07/2021 7.0  6.1 - 8.1 g/dL Final   Albumin 03/07/2021 4.7  3.6 - 5.1 g/dL Final   Globulin 03/07/2021 2.3  1.9 - 3.7 g/dL (calc) Final   AG Ratio 03/07/2021 2.0  1.0 - 2.5 (calc) Final   Total Bilirubin 03/07/2021 0.3  0.2 - 1.2 mg/dL Final   Alkaline phosphatase (APISO) 03/07/2021 41  36 - 130 U/L Final   AST 03/07/2021 18  10 - 40 U/L Final   ALT 03/07/2021 21  9 - 46 U/L Final   WBC 03/07/2021 7.5  3.8 - 10.8 Thousand/uL Final   RBC 03/07/2021 4.42  4.20 -  5.80 Million/uL Final   Hemoglobin 03/07/2021 13.3  13.2 - 17.1 g/dL Final   HCT 03/07/2021 40.0  38.5 - 50.0 % Final   MCV 03/07/2021 90.5  80.0 - 100.0 fL Final   MCH 03/07/2021 30.1  27.0 - 33.0 pg Final   MCHC 03/07/2021 33.3  32.0 - 36.0 g/dL Final   RDW 03/07/2021 12.5  11.0 - 15.0 % Final   Platelets 03/07/2021 250  140 - 400 Thousand/uL Final   MPV 03/07/2021 11.0  7.5 - 12.5 fL Final   Neutro Abs 03/07/2021 4,935  1,500 - 7,800 cells/uL Final   Lymphs Abs 03/07/2021 1,973  850 - 3,900 cells/uL Final   Absolute Monocytes 03/07/2021 375  200 - 950 cells/uL Final   Eosinophils Absolute 03/07/2021 188  15 - 500 cells/uL Final   Basophils Absolute 03/07/2021 30  0 - 200 cells/uL Final   Neutrophils Relative % 03/07/2021 65.8  % Final   Total Lymphocyte 03/07/2021 26.3  % Final   Monocytes Relative 03/07/2021  5.0  % Final   Eosinophils Relative 03/07/2021 2.5  % Final   Basophils Relative 03/07/2021 0.4  % Final    Past Medical History:  Diagnosis Date   Avascular necrosis of bone of right hip (HCC)    Cervicalgia    COPD (chronic obstructive pulmonary disease) (HCC)    GAD (generalized anxiety disorder)    Hypertension    Hypertriglyceridemia    Lumbago    Lumbosacral spondylosis without myelopathy    Myalgia and myositis, unspecified    Other symptoms referable to back    PSA (psoriatic arthritis) ([place])    Smoker    Thoracic spondylosis without myelopathy    Past Surgical History:  Procedure Laterality Date   DISTAL CLAVICLE EXCISION Right 2017   ROTATOR CUFF REPAIR  2010   right   TONSILLECTOMY AND ADENOIDECTOMY  Age 12   Current Outpatient Medications on File Prior to Visit  Medication Sig Dispense Refill   albuterol (VENTOLIN HFA) 108 (90 Base) MCG/ACT inhaler TAKE 2 PUFFS BY MOUTH EVERY 6 HOURS AS NEEDED FOR WHEEZE OR SHORTNESS OF BREATH 20.1 each 3   amoxicillin-clavulanate (AUGMENTIN) 875-125 MG tablet Take 1 tablet by mouth 2 (two) times daily. 20 tablet 0   budesonide-formoterol (SYMBICORT) 160-4.5 MCG/ACT inhaler TAKE 2 PUFFS BY MOUTH TWICE A DAY 3 each 3   fenofibrate (TRICOR) 145 MG tablet TAKE 1 TABLET BY MOUTH EVERY DAY 90 tablet 1   fluvoxaMINE (LUVOX) 100 MG tablet Take 100 mg by mouth at bedtime.     gabapentin (NEURONTIN) 300 MG capsule Take 300 mg by mouth 3 (three) times daily.   2   HYDROcodone bit-homatropine (HYCODAN) 5-1.5 MG/5ML syrup Take 5 mLs by mouth every 8 (eight) hours as needed for cough. 120 mL 0   lisinopril (ZESTRIL) 10 MG tablet TAKE 1 TABLET BY MOUTH EVERY DAY 90 tablet 3   loratadine (CLARITIN) 10 MG tablet Take 10 mg by mouth daily.     Oxycodone HCl 10 MG TABS Take 10 mg by mouth 4 (four) times daily.      predniSONE (DELTASONE) 20 MG tablet 3 tabs poqday 1-2, 2 tabs poqday 3-4, 1 tab poqday 5-6 12 tablet 0   QUEtiapine (SEROQUEL) 400 MG  tablet Take 400 mg by mouth at bedtime.     tiZANidine (ZANAFLEX) 4 MG tablet Take 4 mg by mouth 3 (three) times daily.     traZODone (DESYREL) 100 MG tablet Take 100 mg by  mouth at bedtime.     No current facility-administered medications on file prior to visit.   Allergies  Allergen Reactions   Ceclor [Cefaclor]    Sulfa Antibiotics    Social History   Socioeconomic History   Marital status: Single    Spouse name: Not on file   Number of children: Not on file   Years of education: Not on file   Highest education level: Not on file  Occupational History   Not on file  Tobacco Use   Smoking status: Every Day    Packs/day: 1.50    Years: 10.00    Pack years: 15.00    Types: Cigarettes   Smokeless tobacco: Never  Substance and Sexual Activity   Alcohol use: Yes    Alcohol/week: 0.0 standard drinks    Comment: occasional once a month   Drug use: No   Sexual activity: Not Currently  Other Topics Concern   Not on file  Social History Narrative   05/21/2012 AHW [name] was born and grew up in [place], [place]. He reports he had a good childhood. He has one sister who is 3 years younger than he. His parents are still married. He graduated from high school and attended [employer] studying information systems for 2 years but did not turn a degree. In high school he was a member of the [employer]. 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 He shares custody with his son's mother, but the patient keeps his son most of the time. He denies any current legal issues. He reports that his social support system consists of his mother, father, and sister. He currently lives with his family. 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 He reports that he believes in God but he does not practice any religion. 05/21/2012 AHW   Social Determinants of Health    Financial Resource Strain: Not on file  Food Insecurity: Not on file  Transportation Needs: Not on file  Physical Activity: Not on file  Stress: Not on file  Social Connections: Not on file  Intimate Partner Violence: Not on file    Review of Systems  All other systems reviewed and are negative.     Objective:   Physical Exam Vitals reviewed.  Constitutional:      General: He is not in acute distress.    Appearance: Normal appearance. He is obese. He is not ill-appearing, toxic-appearing or diaphoretic.  HENT:     Head: Normocephalic and atraumatic.     Right Ear: Tympanic membrane and ear canal normal. There is no impacted cerumen.     Left Ear: Tympanic membrane and ear canal normal. There is no impacted cerumen.     Nose: Nose normal. No congestion or rhinorrhea.     Mouth/Throat:     Mouth: Mucous membranes are moist.     Pharynx: Oropharynx is clear. No oropharyngeal exudate or posterior oropharyngeal erythema.  Eyes:     General: No scleral icterus.       Right eye: No discharge.        Left eye: No discharge.     Extraocular Movements: Extraocular movements intact.     Conjunctiva/sclera: Conjunctivae normal.     Pupils: Pupils are equal, round, and reactive to light.  Neck:     Vascular: No carotid bruit.  Cardiovascular:     Rate and  Rhythm: Normal rate and regular rhythm.     Heart sounds: Normal heart sounds. No murmur heard.   No friction rub. No gallop.  Pulmonary:     Effort: Pulmonary effort is normal. No respiratory distress.     Breath sounds: Normal breath sounds. No stridor. No wheezing, rhonchi or rales.  Abdominal:     General: Abdomen is flat. Bowel sounds are normal. There is no distension.     Palpations: There is no mass.     Tenderness: There is no abdominal tenderness. There is no right CVA tenderness, left CVA tenderness, guarding or rebound.     Hernia: No hernia is present.  Musculoskeletal:     Cervical back: Normal range of motion.  No rigidity or tenderness.     Right lower leg: No edema.     Left lower leg: No edema.  Lymphadenopathy:     Cervical: No cervical adenopathy.  Skin:    General: Skin is warm.     Coloration: Skin is not jaundiced.     Findings: No bruising, erythema, lesion or rash.  Neurological:     General: No focal deficit present.     Mental Status: He is alert and oriented to person, place, and time. Mental status is at baseline.     Cranial Nerves: No cranial nerve deficit.     Sensory: No sensory deficit.     Motor: No weakness.     Coordination: Coordination normal.     Gait: Gait normal.     Deep Tendon Reflexes: Reflexes normal.  Psychiatric:        Mood and Affect: Mood normal.        Behavior: Behavior normal.        Thought Content: Thought content normal.        Judgment: Judgment normal.          Assessment & Plan:   General medical exam  Pure hypercholesterolemia - Plan: CBC with Differential/Platelet, Lipid panel, COMPLETE METABOLIC PANEL WITH GFR  Benign essential HTN - Plan: CBC with Differential/Platelet, Lipid panel, COMPLETE METABOLIC PANEL WITH GFR  Hypertriglyceridemia - Plan: CBC with Differential/Platelet, Lipid panel, COMPLETE METABOLIC PANEL WITH GFR  Avascular necrosis of right femur (HCC)  Smoker  Dyslipidemia - Plan: CBC with Differential/Platelet, Lipid panel, COMPLETE METABOLIC PANEL WITH GFR Patient has a rash on his back consistent with tinea versicolor.  I will give him ketoconazole cream to apply twice a day to try to treat the rash.  Continue to encourage smoking cessation.  Defer management of his polyarthralgias and back pain to his orthopedist and his pain management specialist.  Continue to encourage the patient to avoid NSAIDs due to his chronic kidney disease.  LDL cholesterol is acceptable at 117.  Chronic kidney disease is stable with a creatinine of 1.3.  Continue to encourage exercise and weight loss.

## 2021-04-10 NOTE — Addendum Note (Signed)
Addended by: Amado Coe on: 04/10/2021 09:29 AM   Modules accepted: Orders

## 2021-05-10 DIAGNOSIS — M791 Myalgia, unspecified site: Secondary | ICD-10-CM | POA: Diagnosis not present

## 2021-05-17 ENCOUNTER — Telehealth: Payer: Self-pay

## 2021-05-17 NOTE — Telephone Encounter (Signed)
Pt called in stating that he received a bill for $85.00 for an cpe/tdap vaccination. Pt states that he was told by his insurance [employer] that he was covered for that vaccination. Pt also states that he is on a fixed income and cannot afford to pay this bill. Please advise.  Cb#: [phone]

## 2021-05-19 ENCOUNTER — Other Ambulatory Visit: Payer: Self-pay | Admitting: Family Medicine

## 2021-05-30 ENCOUNTER — Other Ambulatory Visit: Payer: Self-pay

## 2021-05-30 MED ORDER — SYMBICORT 160-4.5 MCG/ACT IN AERO: 2.0000 | INHALATION_SPRAY | Freq: Two times a day (BID) | RESPIRATORY_TRACT | 4 refills | Status: DC

## 2021-06-30 IMAGING — CR DG RIBS W/ CHEST 3+V*R*
3 series · 3 of 3 positions shown · non-contrast
Comparison: Chest radiographs 10/22/2017

CLINICAL DATA: Right upper quadrant pain. Right pleurisy.
Additional history provided: Right anterior lower rib pain for 1
week, no known injury

EXAM:
RIGHT RIBS AND CHEST - 3+ VIEW

[w chest pa *]
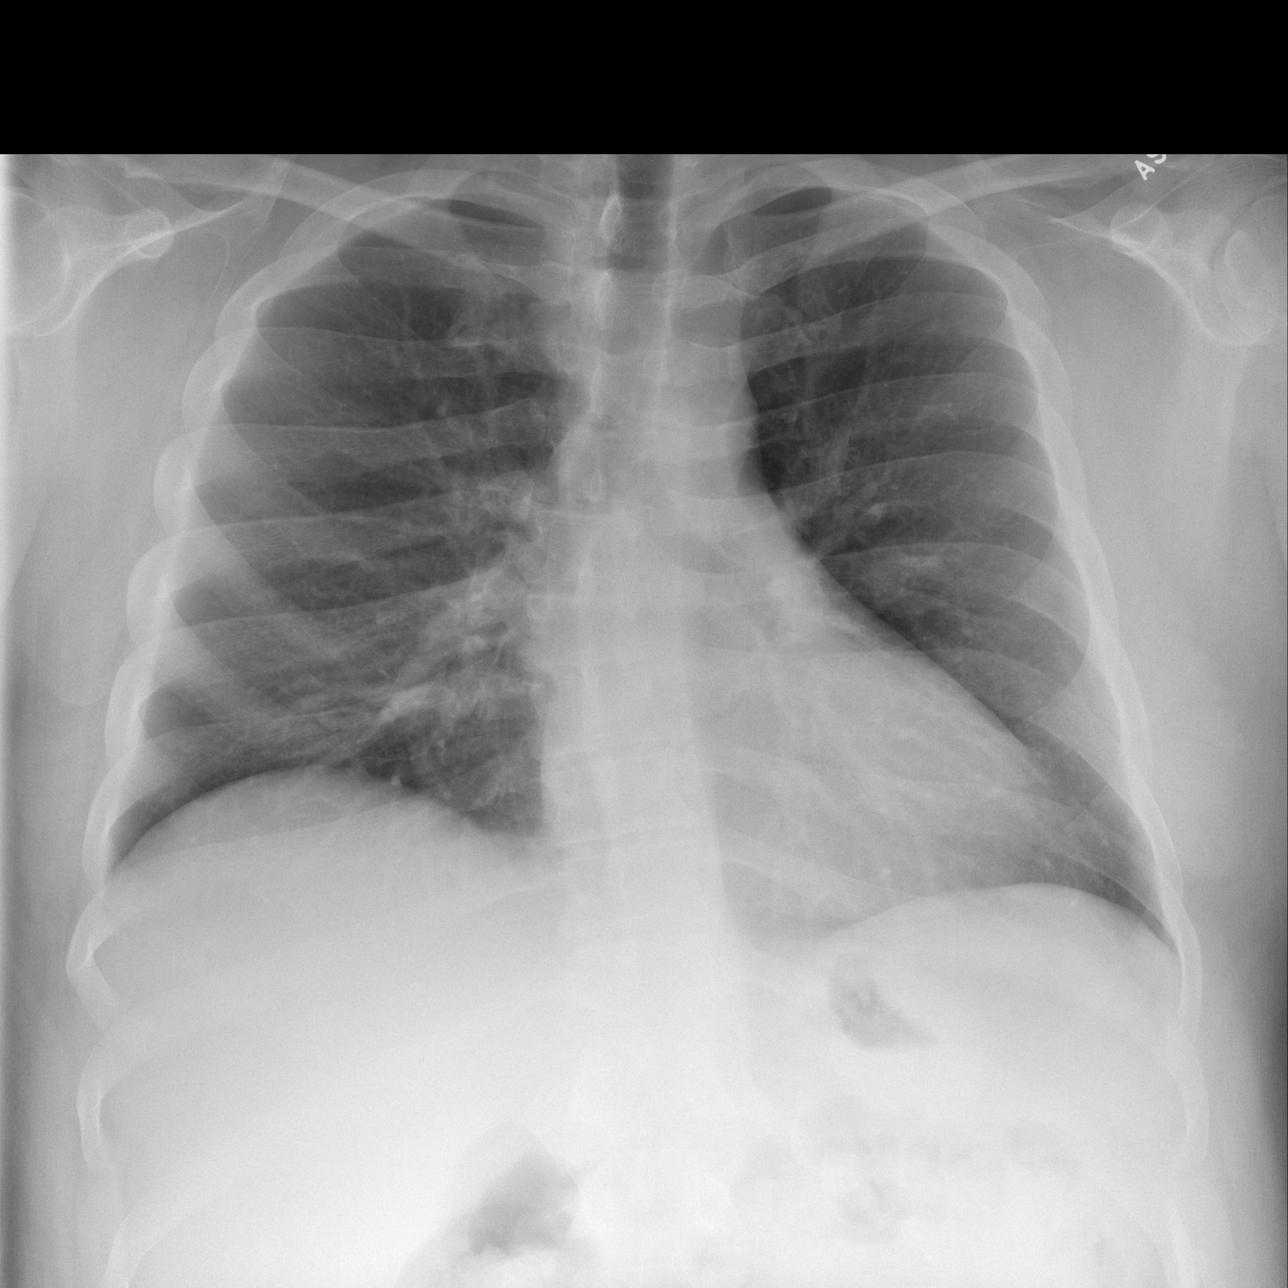

[w ribs ap/pa lower right *]
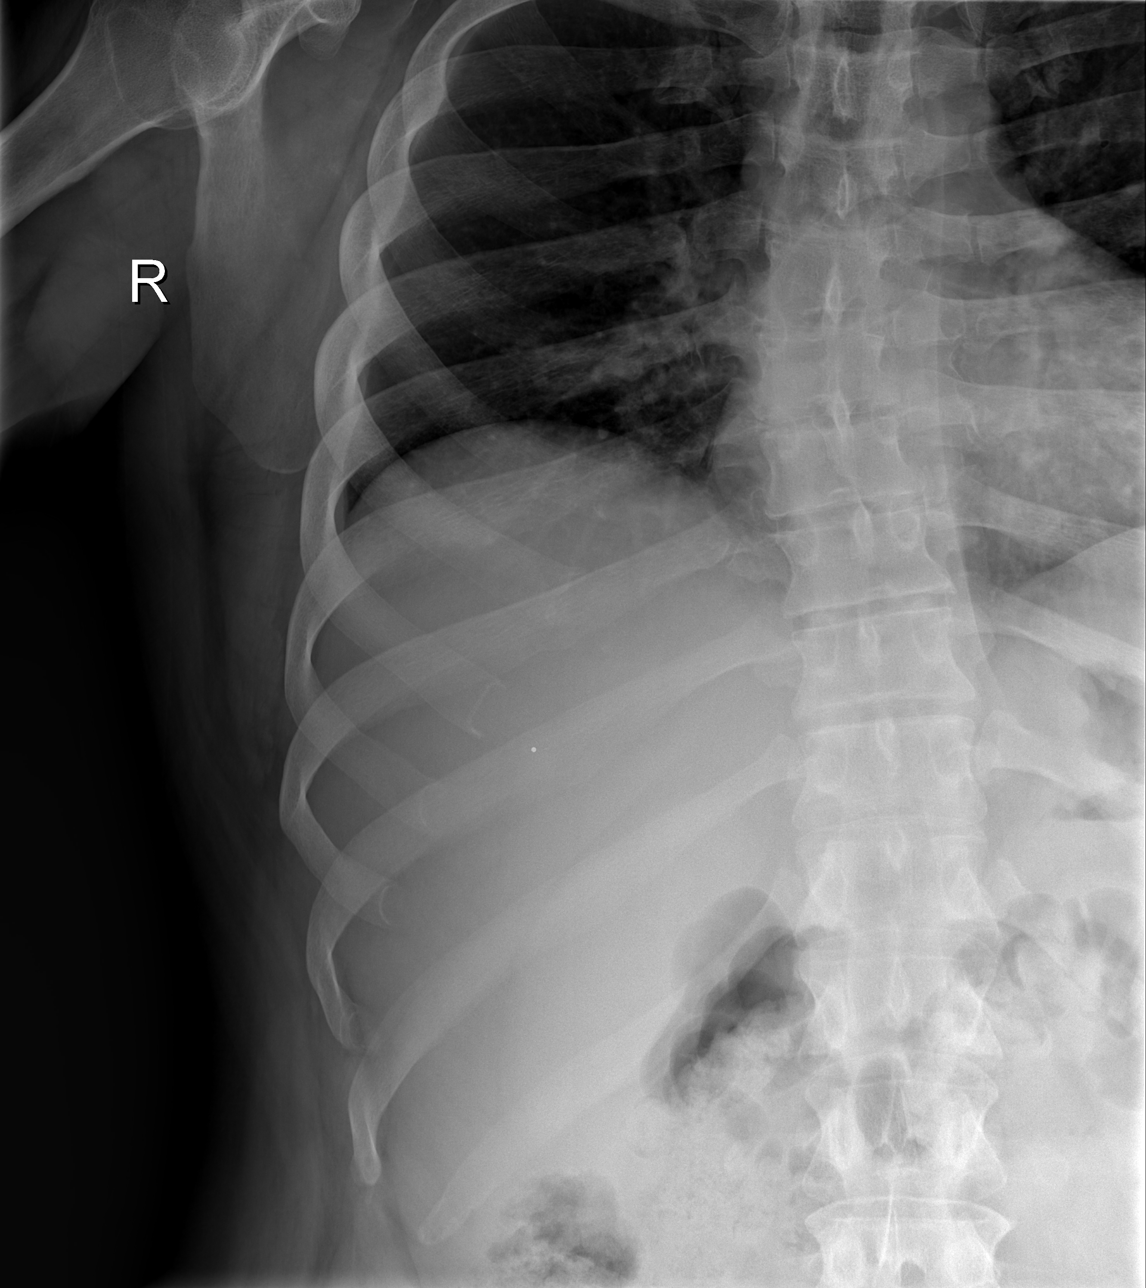

[w ribs oblique right *]
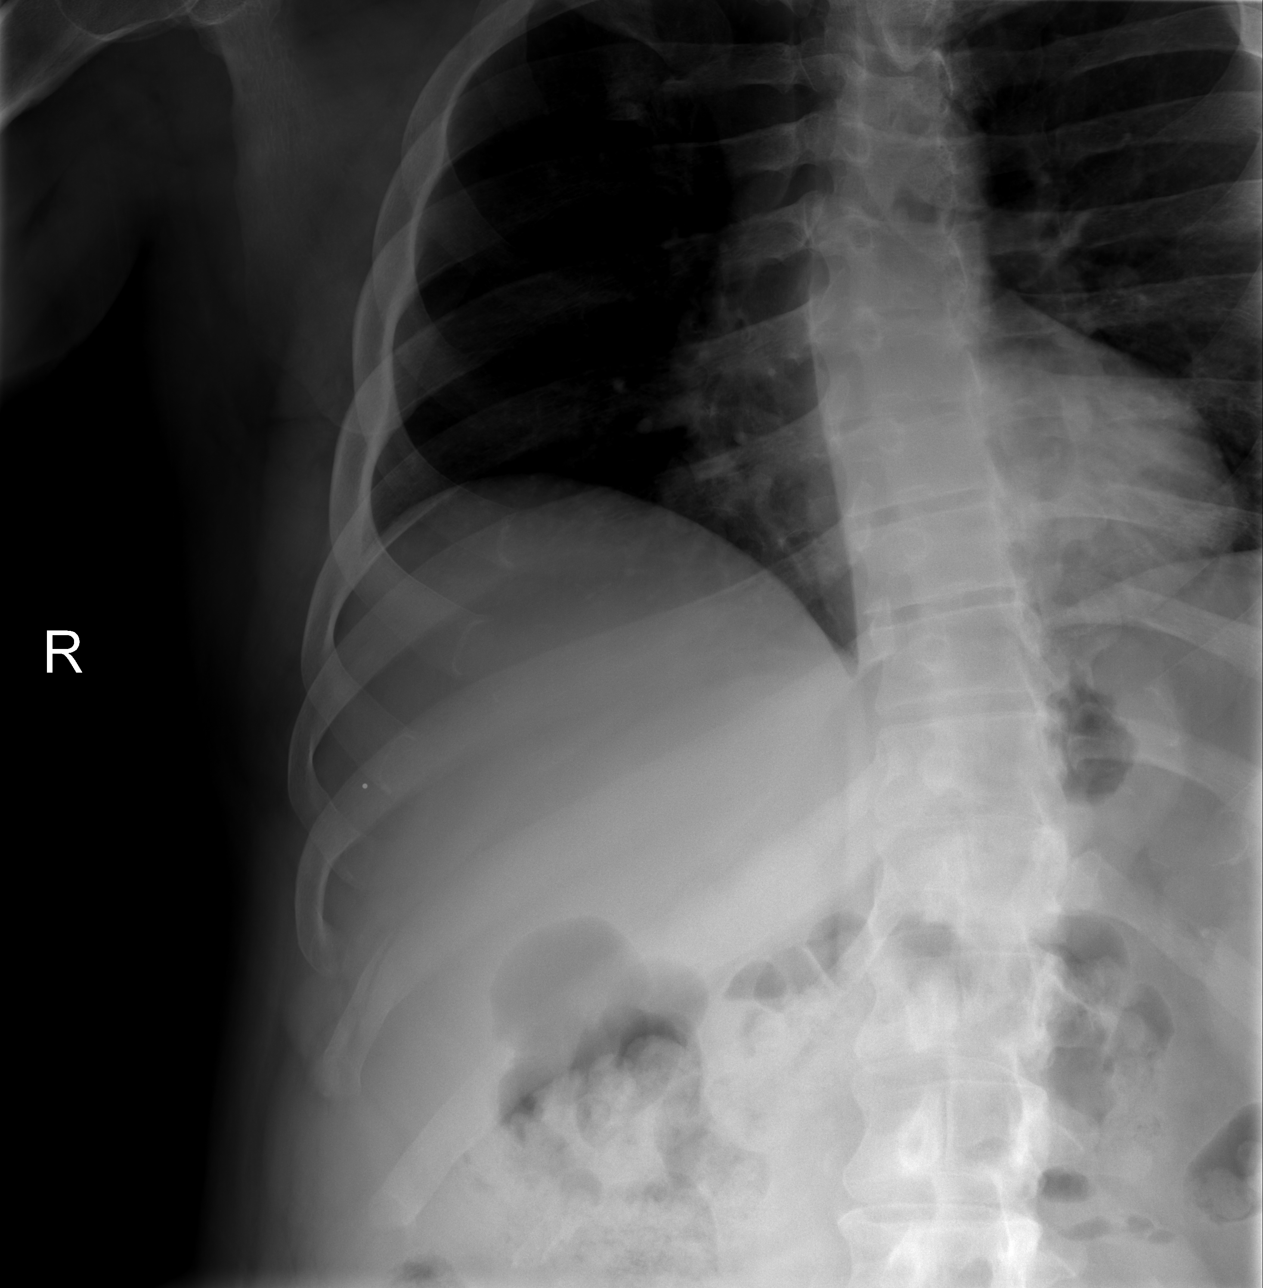

[3 of 3 positions shown; findings below may reference images not displayed]

FINDINGS: No fracture or other bone lesions are seen involving the ribs.
Redemonstrated thoracic dextrocurvature. There is no evidence of
pneumothorax or pleural effusion. No appreciable airspace
consolidation. Heart size within normal limits.
IMPRESSION: No fracture or other bone lesions are seen involving the ribs.

Redemonstrated thoracic dextrocurvature.

No evidence of active cardiopulmonary disease.

## 2021-07-02 DIAGNOSIS — Z79891 Long term (current) use of opiate analgesic: Secondary | ICD-10-CM | POA: Diagnosis not present

## 2021-07-02 DIAGNOSIS — M549 Dorsalgia, unspecified: Secondary | ICD-10-CM | POA: Diagnosis not present

## 2021-07-02 DIAGNOSIS — G894 Chronic pain syndrome: Secondary | ICD-10-CM | POA: Diagnosis not present

## 2021-07-02 DIAGNOSIS — M5030 Other cervical disc degeneration, unspecified cervical region: Secondary | ICD-10-CM | POA: Diagnosis not present

## 2021-07-02 DIAGNOSIS — M5136 Other intervertebral disc degeneration, lumbar region: Secondary | ICD-10-CM | POA: Diagnosis not present

## 2021-07-04 NOTE — Telephone Encounter (Signed)
Patient doesn't have an outstanding balance. We were waiting on [employer]act rx to pay for the vaccination. ?

## 2021-07-18 DIAGNOSIS — Z79899 Other long term (current) drug therapy: Secondary | ICD-10-CM | POA: Diagnosis not present

## 2021-08-15 ENCOUNTER — Other Ambulatory Visit: Payer: Self-pay | Admitting: Family Medicine

## 2021-09-20 ENCOUNTER — Ambulatory Visit (INDEPENDENT_AMBULATORY_CARE_PROVIDER_SITE_OTHER): Payer: Medicare Other | Admitting: Family Medicine

## 2021-09-20 VITALS — BP 128/92 | HR 69 | Temp 98.8°F | Ht 68.0 in | Wt 242.0 lb

## 2021-09-20 DIAGNOSIS — J441 Chronic obstructive pulmonary disease with (acute) exacerbation: Secondary | ICD-10-CM | POA: Diagnosis not present

## 2021-09-20 MED ORDER — PREDNISONE 20 MG PO TABS: ORAL_TABLET | ORAL | 0 refills | Status: DC

## 2021-09-20 MED ORDER — HYDROCODONE BIT-HOMATROP MBR 5-1.5 MG/5ML PO SOLN: 5.0000 mL | Freq: Three times a day (TID) | ORAL | 0 refills | Status: DC | PRN

## 2021-09-20 MED ORDER — TRIAMCINOLONE ACETONIDE 40 MG/ML IJ SUSP
60.0000 mg | Freq: Once | INTRAMUSCULAR | Status: AC
  Administered 2021-09-20: 60 mg via INTRAMUSCULAR

## 2021-09-20 MED ORDER — AZITHROMYCIN 250 MG PO TABS
ORAL_TABLET | ORAL | 0 refills | Status: DC
Start: 2021-09-20 — End: 2022-04-04

## 2021-09-20 NOTE — Progress Notes (Signed)
Subjective:    Patient ID: Tim Penton., male    DOB: Sep 10, 1980, 41 y.o.   MRN: [id number] Patient is a very pleasant 41 year old Caucasian gentleman with an underlying history of COPD.  Recently a family member had an upper respiratory infection.  He acquired the same infection.  However he is now coughing profusely.  He is audibly wheezing on exam.  He has diminished breath sounds bilaterally.  He is having to use his albuterol every 4-6 hours.  He denies any fever or chills.  He denies any purulent sputum.  He denies any hemoptysis although he does report bilateral pleurisy. Past Medical History:  Diagnosis Date   Avascular necrosis of bone of right hip (HCC)    Cervicalgia    COPD (chronic obstructive pulmonary disease) (HCC)    GAD (generalized anxiety disorder)    Hypertension    Hypertriglyceridemia    Lumbago    Lumbosacral spondylosis without myelopathy    Myalgia and myositis, unspecified    Other symptoms referable to back    PSA (psoriatic arthritis) ([place])    Smoker    Thoracic spondylosis without myelopathy    Past Surgical History:  Procedure Laterality Date   DISTAL CLAVICLE EXCISION Right 2017   ROTATOR CUFF REPAIR  2010   right   TONSILLECTOMY AND ADENOIDECTOMY  Age 41   Current Outpatient Medications on File Prior to Visit  Medication Sig Dispense Refill   albuterol (VENTOLIN HFA) 108 (90 Base) MCG/ACT inhaler TAKE 2 PUFFS BY MOUTH EVERY 6 HOURS AS NEEDED FOR WHEEZE OR SHORTNESS OF BREATH 20.1 each 3   fenofibrate (TRICOR) 145 MG tablet TAKE 1 TABLET BY MOUTH EVERY DAY 90 tablet 1   fluvoxaMINE (LUVOX) 100 MG tablet Take 100 mg by mouth at bedtime.     gabapentin (NEURONTIN) 300 MG capsule Take 300 mg by mouth 3 (three) times daily.   2   ketoconazole (NIZORAL) 2 % cream Apply 1 application topically daily. 30 g 3   lisinopril (ZESTRIL) 10 MG tablet TAKE 1 TABLET BY MOUTH EVERY DAY 90 tablet 3   loratadine (CLARITIN) 10 MG tablet Take 10 mg by mouth daily.      Oxycodone HCl 10 MG TABS Take 10 mg by mouth 4 (four) times daily.      predniSONE (DELTASONE) 20 MG tablet 3 tabs poqday 1-2, 2 tabs poqday 3-4, 1 tab poqday 5-6 12 tablet 0   QUEtiapine (SEROQUEL) 400 MG tablet Take 400 mg by mouth at bedtime.     SYMBICORT 160-4.5 MCG/ACT inhaler Inhale 2 puffs into the lungs 2 (two) times daily. 3 each 4   tiZANidine (ZANAFLEX) 4 MG tablet Take 4 mg by mouth 3 (three) times daily.     traZODone (DESYREL) 100 MG tablet Take 100 mg by mouth at bedtime.     No current facility-administered medications on file prior to visit.   Allergies  Allergen Reactions   Ceclor [Cefaclor]    Sulfa Antibiotics    Social History   Socioeconomic History   Marital status: Single    Spouse name: Not on file   Number of children: Not on file   Years of education: Not on file   Highest education level: Not on file  Occupational History   Not on file  Tobacco Use   Smoking status: Every Day    Packs/day: 1.50    Years: 10.00    Pack years: 15.00    Types: Cigarettes    Start date:  04/29/1997   Smokeless tobacco: Never  Substance and Sexual Activity   Alcohol use: Yes    Alcohol/week: 0.0 standard drinks    Comment: occasional once a month   Drug use: No   Sexual activity: Not Currently  Other Topics Concern   Not on file  Social History Narrative   05/21/2012 AHW [name] was born and grew up in [place], [place]. He reports he had a good childhood. He has one sister who is 3 years younger than he. His parents are still married. He graduated from high school and attended [employer] studying information systems for 2 years but did not turn a degree. In high school he was a member of the [employer]. 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 He has never been married he has one son who is currently 82 years old. He shares custody with his son's mother, but the patient keeps his son most of the time. He denies any  current legal issues. He reports that his social support system consists of his mother, father, and sister. He currently lives with his family. 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 He reports that he believes in God but he does not practice any religion. 05/21/2012 AHW   Social Determinants of Health   Financial Resource Strain: Not on file  Food Insecurity: Not on file  Transportation Needs: Not on file  Physical Activity: Not on file  Stress: Not on file  Social Connections: Not on file  Intimate Partner Violence: Not on file    Review of Systems  All other systems reviewed and are negative.     Objective:   Physical Exam Vitals reviewed.  Constitutional:      Appearance: He is obese.  HENT:     Right Ear: Tympanic membrane normal.     Left Ear: Tympanic membrane and ear canal normal.     Nose: Congestion present.     Right Sinus: Frontal sinus tenderness present.     Left Sinus: Frontal sinus tenderness present.  Cardiovascular:     Rate and Rhythm: Normal rate and regular rhythm.  Pulmonary:     Effort: Pulmonary effort is normal.     Breath sounds: Decreased air movement present. Decreased breath sounds and wheezing present. No rhonchi or rales.  Chest:     Chest wall: Tenderness present.  Abdominal:     General: Bowel sounds are normal. There are no signs of injury.     Palpations: Abdomen is soft.  Neurological:     Mental Status: He is alert.          Assessment & Plan:  COPD with acute exacerbation [facility]) Patient is essentially having an asthma attack.  Give Depo-Medrol 60 mg IM x1 now.  Begin prednisone taper pack.  Use albuterol 2 puffs every 4-6 hours as needed.  I gave him a Z-Pak with instructions to fill the antibiotics only if he develops a fever or purulent sputum.  Use Hycodan 1 teaspoon every 8 hours as needed for cough.

## 2021-09-20 NOTE — Addendum Note (Signed)
Addended by: Tyrone Apple on: 09/20/2021 05:07 PM   Modules accepted: Orders

## 2021-10-15 DIAGNOSIS — Z79891 Long term (current) use of opiate analgesic: Secondary | ICD-10-CM | POA: Diagnosis not present

## 2021-10-15 DIAGNOSIS — G894 Chronic pain syndrome: Secondary | ICD-10-CM | POA: Diagnosis not present

## 2021-10-15 DIAGNOSIS — M5136 Other intervertebral disc degeneration, lumbar region: Secondary | ICD-10-CM | POA: Diagnosis not present

## 2021-10-15 DIAGNOSIS — M5030 Other cervical disc degeneration, unspecified cervical region: Secondary | ICD-10-CM | POA: Diagnosis not present

## 2021-11-07 DIAGNOSIS — M79672 Pain in left foot: Secondary | ICD-10-CM | POA: Diagnosis not present

## 2021-11-22 DIAGNOSIS — M79672 Pain in left foot: Secondary | ICD-10-CM | POA: Diagnosis not present

## 2021-11-22 DIAGNOSIS — S92355A Nondisplaced fracture of fifth metatarsal bone, left foot, initial encounter for closed fracture: Secondary | ICD-10-CM | POA: Diagnosis not present

## 2021-11-22 DIAGNOSIS — S92353A Displaced fracture of fifth metatarsal bone, unspecified foot, initial encounter for closed fracture: Secondary | ICD-10-CM | POA: Insufficient documentation

## 2022-01-21 DIAGNOSIS — G894 Chronic pain syndrome: Secondary | ICD-10-CM | POA: Diagnosis not present

## 2022-01-21 DIAGNOSIS — Z79899 Other long term (current) drug therapy: Secondary | ICD-10-CM | POA: Diagnosis not present

## 2022-01-21 DIAGNOSIS — M5459 Other low back pain: Secondary | ICD-10-CM | POA: Diagnosis not present

## 2022-01-21 DIAGNOSIS — Z79891 Long term (current) use of opiate analgesic: Secondary | ICD-10-CM | POA: Diagnosis not present

## 2022-01-21 DIAGNOSIS — M62830 Muscle spasm of back: Secondary | ICD-10-CM | POA: Diagnosis not present

## 2022-01-21 DIAGNOSIS — M5030 Other cervical disc degeneration, unspecified cervical region: Secondary | ICD-10-CM | POA: Diagnosis not present

## 2022-01-21 DIAGNOSIS — M5136 Other intervertebral disc degeneration, lumbar region: Secondary | ICD-10-CM | POA: Diagnosis not present

## 2022-01-21 DIAGNOSIS — Z5181 Encounter for therapeutic drug level monitoring: Secondary | ICD-10-CM | POA: Diagnosis not present

## 2022-02-02 ENCOUNTER — Other Ambulatory Visit: Payer: Self-pay | Admitting: Family Medicine

## 2022-02-04 ENCOUNTER — Other Ambulatory Visit: Payer: Self-pay | Admitting: Family Medicine

## 2022-02-04 NOTE — Telephone Encounter (Signed)
Received eFax from pharmacy to request refill or new script for  lisinopril (ZESTRIL) 10 MG tablet   Ph[facility]acy[facility]  [id number] [place] [place] [address] [address] Phone:  [phone] Fax:  [phone] DEA #:  [id number] LOV: 09/20/2021 LAST REFILL:  02/21/21 - 90 TAB with 3 refills  Please advise pharmacist at [phone] Fax: [phone]

## 2022-02-05 MED ORDER — LISINOPRIL 10 MG PO TABS: 10.0000 mg | ORAL_TABLET | Freq: Every day | ORAL | 0 refills | Status: DC

## 2022-02-05 NOTE — Telephone Encounter (Signed)
Requested Prescriptions  Pending Prescriptions Disp Refills  . lisinopril (ZESTRIL) 10 MG tablet 90 tablet 0    Sig: Take 1 tablet (10 mg total) by mouth daily.     Cardiovascular:  ACE Inhibitors Failed - 02/04/2022 10:45 AM      Failed - Cr in normal range and within 180 days    Creat  Date Value Ref Range Status  03/07/2021 1.30 (H) 0.60 - 1.26 mg/dL Final         Failed - K in normal range and within 180 days    Potassium  Date Value Ref Range Status  03/07/2021 4.5 3.5 - 5.3 mmol/L Final         Failed - Last BP in normal range    BP Readings from Last 1 Encounters:  09/20/21 (!) 128/92         Passed - Patient is not pregnant      Passed - Valid encounter within last 6 months    Recent Outpatient Visits          4 months ago COPD with acute exacerbation ([place])   [place] Pickard, Cammie Mcgee, MD   10 months ago General medical exam   [place] Susy Frizzle, MD   11 months ago COPD with acute exacerbation [facility])   [place] Pickard, Cammie Mcgee, MD   1 year ago Orthostatic hypotension   [place] Pickard, Cammie Mcgee, MD   1 year ago Orthostatic hypotension   [place] Eulogio Bear, NP

## 2022-02-27 ENCOUNTER — Other Ambulatory Visit: Payer: Self-pay | Admitting: Family Medicine

## 2022-02-27 NOTE — Telephone Encounter (Signed)
Requested Prescriptions  Pending Prescriptions Disp Refills  . fenofibrate (TRICOR) 145 MG tablet [Pharmacy Med Name: FENOFIBRATE 145 MG TABLET] 35 tablet 0    Sig: TAKE 1 TABLET BY MOUTH EVERY DAY     Cardiovascular:  Antilipid - Fibric Acid Derivatives Failed - 02/27/2022  2:00 AM      Failed - Cr in normal range and within 360 days    Creat  Date Value Ref Range Status  03/07/2021 1.30 (H) 0.60 - 1.26 mg/dL Final         Failed - Lipid Panel in normal range within the last 12 months    Cholesterol  Date Value Ref Range Status  03/07/2021 183 <200 mg/dL Final   LDL Cholesterol (Calc)  Date Value Ref Range Status  03/07/2021 117 (H) mg/dL (calc) Final    Comment:    Reference range: <100 . Desirable range <100 mg/dL for primary prevention;   <70 mg/dL for patients with CHD or diabetic patients  with > or = 2 CHD risk factors. Marland Kitchen LDL-C is now calculated using the Martin-Hopkins  calculation, which is a validated novel method providing  better accuracy than the Friedewald equation in the  estimation of LDL-C.  Cresenciano Genre et al. Annamaria Helling. 0272;536(64): 2061-2068  (http://education.[facility].com/faq/FAQ164)    HDL  Date Value Ref Range Status  03/07/2021 40 > OR = 40 mg/dL Final   Triglycerides  Date Value Ref Range Status  03/07/2021 149 <150 mg/dL Final         Passed - ALT in normal range and within 360 days    ALT  Date Value Ref Range Status  03/07/2021 21 9 - 46 U/L Final         Passed - AST in normal range and within 360 days    AST  Date Value Ref Range Status  03/07/2021 18 10 - 40 U/L Final         Passed - HGB in normal range and within 360 days    Hemoglobin  Date Value Ref Range Status  03/07/2021 13.3 13.2 - 17.1 g/dL Final         Passed - HCT in normal range and within 360 days    HCT  Date Value Ref Range Status  03/07/2021 40.0 38.5 - 50.0 % Final         Passed - PLT in normal range and within 360 days    Platelets  Date Value  Ref Range Status  03/07/2021 250 140 - 400 Thousand/uL Final         Passed - WBC in normal range and within 360 days    WBC  Date Value Ref Range Status  03/07/2021 7.5 3.8 - 10.8 Thousand/uL Final         Passed - eGFR is 30 or above and within 360 days    GFR, Est African American  Date Value Ref Range Status  11/15/2019 75 > OR = 60 mL/min/1.76m Final   GFR, Est Non African American  Date Value Ref Range Status  11/15/2019 65 > OR = 60 mL/min/1.764mFinal   GFR, Estimated  Date Value Ref Range Status  05/22/2020 >60 >60 mL/min Final    Comment:    (NOTE) Calculated using the CKD-EPI Creatinine Equation (2021)    eGFR  Date Value Ref Range Status  03/07/2021 72 > OR = 60 mL/min/1.7398minal    Comment:    The eGFR is based on the CKD-EPI 2021 equation. To calculate  the new eGFR from a previous Creatinine or Cystatin C result, go to https://www.kidney.org/professionals/ kdoqi/gfr%5Fcalculator          Passed - Valid encounter within last 12 months    Recent Outpatient Visits          5 months ago COPD with acute exacerbation ([place])   [place] Pickard, Cammie Mcgee, MD   10 months ago General medical exam   [place] Susy Frizzle, MD   1 year ago COPD with acute exacerbation [facility])   [facility] [place] Scha[place] Cammie Mcgee, MD   1 year ago Orthostatic hypotension   [place] Dennard Schaumann, Cammie Mcgee, MD   1 year ago Orthostatic hypotension   [place], NP      Future Appointments            In 1 month Pickard, Cammie Mcgee, MD [place]

## 2022-04-03 ENCOUNTER — Other Ambulatory Visit: Payer: Self-pay | Admitting: Family Medicine

## 2022-04-04 ENCOUNTER — Other Ambulatory Visit (INDEPENDENT_AMBULATORY_CARE_PROVIDER_SITE_OTHER): Payer: Medicare Other

## 2022-04-04 DIAGNOSIS — E785 Hyperlipidemia, unspecified: Secondary | ICD-10-CM | POA: Diagnosis not present

## 2022-04-04 DIAGNOSIS — Z23 Encounter for immunization: Secondary | ICD-10-CM | POA: Diagnosis not present

## 2022-04-04 DIAGNOSIS — I10 Essential (primary) hypertension: Secondary | ICD-10-CM

## 2022-04-04 DIAGNOSIS — E781 Pure hyperglyceridemia: Secondary | ICD-10-CM | POA: Diagnosis not present

## 2022-04-04 NOTE — Addendum Note (Signed)
Addended by: Randal Buba K on: 04/04/2022 10:26 AM   Modules accepted: Orders

## 2022-04-05 ENCOUNTER — Telehealth: Payer: Self-pay

## 2022-04-05 ENCOUNTER — Other Ambulatory Visit: Payer: Self-pay

## 2022-04-05 DIAGNOSIS — J209 Acute bronchitis, unspecified: Secondary | ICD-10-CM

## 2022-04-05 LAB — COMPLETE METABOLIC PANEL WITH GFR
AG Ratio: 2 (calc) (ref 1.0–2.5)
ALT: 19 U/L (ref 9–46)
AST: 18 U/L (ref 10–40)
Albumin: 4.6 g/dL (ref 3.6–5.1)
Alkaline phosphatase (APISO): 45 U/L (ref 36–130)
BUN/Creatinine Ratio: 10 (calc) (ref 6–22)
BUN: 15 mg/dL (ref 7–25)
CO2: 26 mmol/L (ref 20–32)
Calcium: 9.5 mg/dL (ref 8.6–10.3)
Chloride: 102 mmol/L (ref 98–110)
Creat: 1.47 mg/dL — ABNORMAL HIGH (ref 0.60–1.26)
Globulin: 2.3 g/dL (calc) (ref 1.9–3.7)
Glucose, Bld: 99 mg/dL (ref 65–99)
Potassium: 4.5 mmol/L (ref 3.5–5.3)
Sodium: 136 mmol/L (ref 135–146)
Total Bilirubin: 0.3 mg/dL (ref 0.2–1.2)
Total Protein: 6.9 g/dL (ref 6.1–8.1)
eGFR: 62 mL/min/{1.73_m2} (ref >=60)

## 2022-04-05 LAB — LIPID PANEL
Cholesterol: 140 mg/dL (ref <200)
HDL: 40 mg/dL (ref >=40)
LDL Cholesterol (Calc): 81 mg/dL (calc)
Non-HDL Cholesterol (Calc): 100 mg/dL (calc) (ref <130)
Total CHOL/HDL Ratio: 3.5 (calc) (ref <5.0)
Triglycerides: 96 mg/dL (ref <150)

## 2022-04-05 LAB — CBC WITH DIFFERENTIAL/PLATELET
Absolute Monocytes: 342 cells/uL (ref 200–950)
Basophils Absolute: 29 cells/uL (ref 0–200)
Basophils Relative: 0.5 %
Eosinophils Absolute: 188 cells/uL (ref 15–500)
Eosinophils Relative: 3.3 %
HCT: 37.3 % — ABNORMAL LOW (ref 38.5–50.0)
Hemoglobin: 12.8 g/dL — ABNORMAL LOW (ref 13.2–17.1)
Lymphs Abs: 1625 cells/uL (ref 850–3900)
MCH: 31 pg (ref 27.0–33.0)
MCHC: 34.3 g/dL (ref 32.0–36.0)
MCV: 90.3 fL (ref 80.0–100.0)
MPV: 11.4 fL (ref 7.5–12.5)
Monocytes Relative: 6 %
Neutro Abs: 3517 cells/uL (ref 1500–7800)
Neutrophils Relative %: 61.7 %
Platelets: 238 10*3/uL (ref 140–400)
RBC: 4.13 10*6/uL — ABNORMAL LOW (ref 4.20–5.80)
RDW: 12.3 % (ref 11.0–15.0)
Total Lymphocyte: 28.5 %
WBC: 5.7 10*3/uL (ref 3.8–10.8)

## 2022-04-05 MED ORDER — ALBUTEROL SULFATE HFA 108 (90 BASE) MCG/ACT IN AERS: INHALATION_SPRAY | RESPIRATORY_TRACT | 3 refills | Status: DC

## 2022-04-05 NOTE — Telephone Encounter (Signed)
Prescription Request  04/05/2022  Is this a "Controlled Substance" medicine? No  LOV: 09/20/21 (upcoming cpe 04/11/22)  What is the name of the medication or equipment? albuterol (VENTOLIN HFA) 108 (90 Base) MCG/ACT inhaler   Have you contacted your pharmacy to request a refill? Yes   Which pharmacy would you like this sent to?   [id number] [facility] [address]   Patient notified that their request is being sent to the clinical staff for review and that they should receive a response within 2 business days.   Please advise at Mobile [phone](mobile)

## 2022-04-11 ENCOUNTER — Encounter: Payer: Self-pay | Admitting: Family Medicine

## 2022-04-11 ENCOUNTER — Ambulatory Visit (INDEPENDENT_AMBULATORY_CARE_PROVIDER_SITE_OTHER): Payer: Medicare Other | Admitting: Family Medicine

## 2022-04-11 VITALS — BP 124/78 | HR 68 | Ht 68.0 in | Wt 232.6 lb

## 2022-04-11 DIAGNOSIS — F17200 Nicotine dependence, unspecified, uncomplicated: Secondary | ICD-10-CM

## 2022-04-11 DIAGNOSIS — Z0000 Encounter for general adult medical examination without abnormal findings: Secondary | ICD-10-CM | POA: Diagnosis not present

## 2022-04-11 DIAGNOSIS — R0609 Other forms of dyspnea: Secondary | ICD-10-CM

## 2022-04-11 DIAGNOSIS — E781 Pure hyperglyceridemia: Secondary | ICD-10-CM | POA: Diagnosis not present

## 2022-04-11 DIAGNOSIS — N182 Chronic kidney disease, stage 2 (mild): Secondary | ICD-10-CM | POA: Diagnosis not present

## 2022-04-11 DIAGNOSIS — I10 Essential (primary) hypertension: Secondary | ICD-10-CM | POA: Diagnosis not present

## 2022-04-11 NOTE — Progress Notes (Signed)
Subjective:    Patient ID: Tim [name]., male    DOB: 1980/05/25, 41 y.o.   MRN: [id number]  Patient is a 41 year old Caucasian gentleman with a history of hypertension, borderline stage III chronic kidney disease, tobacco abuse, dyslipidemia who presents today for complete physical exam.  His flu shot and tetanus shot are up-to-date however he refuses a COVID shot.  Recently has been getting dyspnea on exertion.  He states that if he walks down a flight of steps or walks up a flight of steps, he has to stop and rest to catch his breath.  He denies any angina but he does have some tightness in his chest with activity.  He believes that he is having panic attacks and he believes it could be anxiety.  The shortness of breath lasts for a few minutes and then passes as long as the rest.  He denies any nausea or vomiting or left arm pain.  Most recent lab work shows a deterioration in his kidney function  Lab on 04/04/2022  Component Date Value Ref Range Status   WBC 04/04/2022 5.7  3.8 - 10.8 Thousand/uL Final   RBC 04/04/2022 4.13 (L)  4.20 - 5.80 Million/uL Final   Hemoglobin 04/04/2022 12.8 (L)  13.2 - 17.1 g/dL Final   HCT 04/04/2022 37.3 (L)  38.5 - 50.0 % Final   MCV 04/04/2022 90.3  80.0 - 100.0 fL Final   MCH 04/04/2022 31.0  27.0 - 33.0 pg Final   MCHC 04/04/2022 34.3  32.0 - 36.0 g/dL Final   RDW 04/04/2022 12.3  11.0 - 15.0 % Final   Platelets 04/04/2022 238  140 - 400 Thousand/uL Final   MPV 04/04/2022 11.4  7.5 - 12.5 fL Final   Neutro Abs 04/04/2022 3,517  1,500 - 7,800 cells/uL Final   Lymphs Abs 04/04/2022 1,625  850 - 3,900 cells/uL Final   Absolute Monocytes 04/04/2022 342  200 - 950 cells/uL Final   Eosinophils Absolute 04/04/2022 188  15 - 500 cells/uL Final   Basophils Absolute 04/04/2022 29  0 - 200 cells/uL Final   Neutrophils Relative % 04/04/2022 61.7  % Final   Total Lymphocyte 04/04/2022 28.5  % Final   Monocytes Relative 04/04/2022 6.0  % Final   Eosinophils  Relative 04/04/2022 3.3  % Final   Basophils Relative 04/04/2022 0.5  % Final   Glucose, Bld 04/04/2022 99  65 - 99 mg/dL Final   Comment: .            Fasting reference interval .    BUN 04/04/2022 15  7 - 25 mg/dL Final   Creat 04/04/2022 1.47 (H)  0.60 - 1.26 mg/dL Final   eGFR 04/04/2022 62  > OR = 60 mL/min/1.21m Final   BUN/Creatinine Ratio 04/04/2022 10  6 - 22 (calc) Final   Sodium 04/04/2022 136  135 - 146 mmol/L Final   Potassium 04/04/2022 4.5  3.5 - 5.3 mmol/L Final   Chloride 04/04/2022 102  98 - 110 mmol/L Final   CO2 04/04/2022 26  20 - 32 mmol/L Final   Calcium 04/04/2022 9.5  8.6 - 10.3 mg/dL Final   Total Protein 04/04/2022 6.9  6.1 - 8.1 g/dL Final   Albumin 04/04/2022 4.6  3.6 - 5.1 g/dL Final   Globulin 04/04/2022 2.3  1.9 - 3.7 g/dL (calc) Final   AG Ratio 04/04/2022 2.0  1.0 - 2.5 (calc) Final   Total Bilirubin 04/04/2022 0.3  0.2 - 1.2 mg/dL Final  Alkaline phosphatase (APISO) 04/04/2022 45  36 - 130 U/L Final   AST 04/04/2022 18  10 - 40 U/L Final   ALT 04/04/2022 19  9 - 46 U/L Final   Cholesterol 04/04/2022 140  <200 mg/dL Final   HDL 04/04/2022 40  > OR = 40 mg/dL Final   Triglycerides 04/04/2022 96  <150 mg/dL Final   LDL Cholesterol (Calc) 04/04/2022 81  mg/dL (calc) Final   Comment: Reference range: <100 . Desirable range <100 mg/dL for primary prevention;   <70 mg/dL for patients with CHD or diabetic patients  with > or = 2 CHD risk factors. Marland Kitchen LDL-C is now calculated using the Martin-Hopkins  calculation, which is a validated novel method providing  better accuracy than the Friedewald equation in the  estimation of LDL-C.  Cresenciano Genre et al. Annamaria Helling. 2774;128(78): 2061-2068  (http://education.[facility].com/faq/FAQ164)    Total CHOL/HDL Ratio 04/04/2022 3.5  <5.0 (calc) Final   Non-HDL Cholesterol (Calc) 04/04/2022 100  <130 mg/dL (calc) Final   Comment: For patients with diabetes plus 1 major ASCVD risk  factor, treating to a non-HDL-C  goal of <100 mg/dL  (LDL-C of <70 mg/dL) is considered a therapeutic  option.     Past Medical History:  Diagnosis Date   Avascular necrosis of bone of right hip (HCC)    Cervicalgia    COPD (chronic obstructive pulmonary disease) (HCC)    GAD (generalized anxiety disorder)    Hypertension    Hypertriglyceridemia    Lumbago    Lumbosacral spondylosis without myelopathy    Myalgia and myositis, unspecified    Other symptoms referable to back    PSA (psoriatic arthritis) ([place])    Smoker    Thoracic spondylosis without myelopathy    Past Surgical History:  Procedure Laterality Date   DISTAL CLAVICLE EXCISION Right 2017   ROTATOR CUFF REPAIR  2010   right   TONSILLECTOMY AND ADENOIDECTOMY  Age 70   Current Outpatient Medications on File Prior to Visit  Medication Sig Dispense Refill   albuterol (VENTOLIN HFA) 108 (90 Base) MCG/ACT inhaler TAKE 2 PUFFS BY MOUTH EVERY 6 HOURS AS NEEDED FOR WHEEZE OR SHORTNESS OF BREATH 20.1 each 3   buPROPion (WELLBUTRIN XL) 150 MG 24 hr tablet Take 150 mg by mouth daily.     fenofibrate (TRICOR) 145 MG tablet TAKE 1 TABLET BY MOUTH EVERY DAY 35 tablet 0   fluvoxaMINE (LUVOX) 100 MG tablet Take 100 mg by mouth at bedtime.     gabapentin (NEURONTIN) 300 MG capsule Take 300 mg by mouth 3 (three) times daily.   2   ketoconazole (NIZORAL) 2 % cream Apply 1 application topically daily. 30 g 3   lisinopril (ZESTRIL) 10 MG tablet Take 1 tablet (10 mg total) by mouth daily. 90 tablet 0   loratadine (CLARITIN) 10 MG tablet Take 10 mg by mouth daily.     Oxycodone HCl 10 MG TABS Take 10 mg by mouth 4 (four) times daily.      QUEtiapine (SEROQUEL) 400 MG tablet Take 400 mg by mouth at bedtime.     SYMBICORT 160-4.5 MCG/ACT inhaler Inhale 2 puffs into the lungs 2 (two) times daily. 3 each 4   tiZANidine (ZANAFLEX) 4 MG tablet Take 4 mg by mouth 3 (three) times daily.     traZODone (DESYREL) 100 MG tablet Take 100 mg by mouth at bedtime.     No current  facility-administered medications on file prior to visit.   Allergies  Allergen Reactions   Ceclor [Cefaclor]    Sulfa Antibiotics    Misc. Sulfonamide Containing Compounds Rash   Social History   Socioeconomic History   Marital status: Single    Spouse name: Not on file   Number of children: Not on file   Years of education: Not on file   Highest education level: Not on file  Occupational History   Not on file  Tobacco Use   Smoking status: Every Day    Packs/day: 1.50    Years: 10.00    Total pack years: 15.00    Types: Cigarettes    Start date: 04/29/1997   Smokeless tobacco: Never  Substance and Sexual Activity   Alcohol use: Yes    Alcohol/week: 0.0 standard drinks of alcohol    Comment: occasional once a month   Drug use: No   Sexual activity: Not Currently  Other Topics Concern   Not on file  Social History Narrative   05/21/2012 AHW [name] was born and grew up in [place], [place]. He reports he had a good childhood. He has one sister who is 3 years younger than he. His parents are still married. He graduated from high school and attended [employer] studying information systems for 2 years but did not turn a degree. In high school he was a member of the [employer]. 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 He shares custody with his son's mother, but the patient keeps his son most of the time. He denies any current legal issues. He reports that his social support system consists of his mother, father, and sister. He currently lives with his family. 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 He reports that he believes in God but he does not practice any religion. 05/21/2012 AHW   Social Determinants of Health   Financial Resource Strain: Not on file  Food Insecurity: Not on file  Transportation Needs: Not on file  Physical  Activity: Not on file  Stress: Not on file  Social Connections: Not on file  Intimate Partner Violence: Not on file    Review of Systems  All other systems reviewed and are negative.      Objective:   Physical Exam Vitals reviewed.  Constitutional:      General: He is not in acute distress.    Appearance: Normal appearance. He is obese. He is not ill-appearing, toxic-appearing or diaphoretic.  HENT:     Head: Normocephalic and atraumatic.     Right Ear: Tympanic membrane and ear canal normal. There is no impacted cerumen.     Left Ear: Tympanic membrane and ear canal normal. There is no impacted cerumen.     Nose: Nose normal. No congestion or rhinorrhea.     Mouth/Throat:     Mouth: Mucous membranes are moist.     Pharynx: Oropharynx is clear. No oropharyngeal exudate or posterior oropharyngeal erythema.  Eyes:     General: No scleral icterus.       Right eye: No discharge.        Left eye: No discharge.     Extraocular Movements: Extraocular movements intact.     Conjunctiva/sclera: Conjunctivae normal.     Pupils: Pupils are equal, round, and reactive to light.  Neck:     Vascular: No carotid bruit.  Cardiovascular:     Rate and Rhythm: Normal rate  and regular rhythm.     Heart sounds: Normal heart sounds. No murmur heard.    No friction rub. No gallop.  Pulmonary:     Effort: Pulmonary effort is normal. No respiratory distress.     Breath sounds: Normal breath sounds. No stridor. No wheezing, rhonchi or rales.  Abdominal:     General: Abdomen is flat. Bowel sounds are normal. There is no distension.     Palpations: There is no mass.     Tenderness: There is no abdominal tenderness. There is no right CVA tenderness, left CVA tenderness, guarding or rebound.     Hernia: No hernia is present.  Musculoskeletal:     Cervical back: Normal range of motion. No rigidity or tenderness.     Right lower leg: No edema.     Left lower leg: No edema.  Lymphadenopathy:      Cervical: No cervical adenopathy.  Skin:    General: Skin is warm.     Coloration: Skin is not jaundiced.     Findings: No bruising, erythema, lesion or rash.  Neurological:     General: No focal deficit present.     Mental Status: He is alert and oriented to person, place, and time. Mental status is at baseline.     Cranial Nerves: No cranial nerve deficit.     Sensory: No sensory deficit.     Motor: No weakness.     Coordination: Coordination normal.     Gait: Gait normal.     Deep Tendon Reflexes: Reflexes normal.  Psychiatric:        Mood and Affect: Mood normal.        Behavior: Behavior normal.        Thought Content: Thought content normal.        Judgment: Judgment normal.           Assessment & Plan:   CKD (chronic kidney disease) stage 2, GFR 60-89 ml/min - Plan: Protein / Creatinine Ratio, Urine  General medical exam  Benign essential HTN  Hypertriglyceridemia  Smoker  Dyspnea on exertion Patient's dyspnea on exertion and shortness of breath could be related to deconditioning and smoking coupled with anxiety.  I would like to get a stress test given his risk factors including hypertension, dyslipidemia, smoking.  Once I know that there is no evidence of underlying ischemia, I will treat the patient for anxiety and deconditioning.  Continue to encourage smoking cessation.  Blood pressure is acceptable.  Renal function is worsening creatinine has deteriorated.  Therefore I will recheck a urine protein to creatinine ratio and if greater than 200 add [place].  Continue to encourage smoking cessation.

## 2022-04-12 LAB — PROTEIN / CREATININE RATIO, URINE
Creatinine, Urine: 126 mg/dL (ref 20–320)
Protein/Creat Ratio: 48 mg/g creat (ref 25–148)
Protein/Creatinine Ratio: 0.048 mg/mg creat (ref 0.025–0.148)
Total Protein, Urine: 6 mg/dL (ref 5–25)

## 2022-04-18 ENCOUNTER — Other Ambulatory Visit: Payer: Self-pay | Admitting: Family Medicine

## 2022-05-09 NOTE — Progress Notes (Signed)
Cardiology Office Note:    Date:  05/10/2022   ID:  Tim Penton., DOB Aug 23, 1980, MRN [id number]  PCP:  Susy Frizzle, MD   [place] Providers Cardiologist:  Werner Lean, MD     Referring MD: Susy Frizzle, MD   CC: SOB Consulted for the evaluation of SOB at the behest of Dr. Dennard Schaumann  History of Present Illness:    Tim Maland. is a 41 y.o. male with a hx of COPD (active smoker started age 4 ~ 68 pack year) HTN, HLD (Tgs) , morbid obesity who presents for evaluation.  Patient notes that he is feeling worried about his EKG findings  Used to have episodes of dizziness with standing.  This improved with sitting back down.  Occurs daily  Gets chest pain when walking the dog.  That is his biggest activity.  Tries to go around a block.  Has to stop because of the back pain.  Chest tightness is better with rest.    Occurs with ADLs. As wekk  No shortness of breath largely with activity.  Breathing gets better with either rest or inhaler. He tries to push through because of him and his dog.  No PND or orthopnea.  No weight gain, leg swelling , or abdominal swelling.  . Notes  no palpitations or funny heart beats.     Ambulatory BP 130/70s.   Past Medical History:  Diagnosis Date   Avascular necrosis of bone of right hip (HCC)    Cervicalgia    COPD (chronic obstructive pulmonary disease) (HCC)    GAD (generalized anxiety disorder)    Hypertension    Hypertriglyceridemia    Lumbago    Lumbosacral spondylosis without myelopathy    Myalgia and myositis, unspecified    Other symptoms referable to back    PSA (psoriatic arthritis) ([place])    Smoker    Thoracic spondylosis without myelopathy     Past Surgical History:  Procedure Laterality Date   DISTAL CLAVICLE EXCISION Right 2017   ROTATOR CUFF REPAIR  2010   right   TONSILLECTOMY AND ADENOIDECTOMY  Age 66    Current Medications: Current Meds  Medication Sig   albuterol (VENTOLIN HFA)  108 (90 Base) MCG/ACT inhaler TAKE 2 PUFFS BY MOUTH EVERY 6 HOURS AS NEEDED FOR WHEEZE OR SHORTNESS OF BREATH   buPROPion (WELLBUTRIN XL) 150 MG 24 hr tablet Take 150 mg by mouth daily.   fenofibrate (TRICOR) 145 MG tablet TAKE 1 TABLET BY MOUTH EVERY DAY   fluvoxaMINE (LUVOX) 50 MG tablet Take 75 mg by mouth at bedtime. Take '50mg'$  and '25mg'$  daily ('75Mg'$ )   gabapentin (NEURONTIN) 300 MG capsule Take 300 mg by mouth 3 (three) times daily.    ketoconazole (NIZORAL) 2 % cream Apply 1 application topically daily.   lisinopril (ZESTRIL) 10 MG tablet TAKE 1 TABLET BY MOUTH EVERY DAY   loratadine (CLARITIN) 10 MG tablet Take 10 mg by mouth daily.   nitroGLYCERIN (NITROSTAT) 0.4 MG SL tablet Place 1 tablet (0.4 mg total) under the tongue every 5 (five) minutes as needed for chest pain.   Oxycodone HCl 10 MG TABS Take 10 mg by mouth 4 (four) times daily.    QUEtiapine (SEROQUEL) 400 MG tablet Take 400 mg by mouth at bedtime.   SYMBICORT 160-4.5 MCG/ACT inhaler Inhale 2 puffs into the lungs 2 (two) times daily.   tiZANidine (ZANAFLEX) 4 MG tablet Take 6 mg by mouth 3 (three) times daily. Take  1-1/2 tabs 3x daily   traZODone (DESYREL) 100 MG tablet Take 100 mg by mouth at bedtime.     Allergies:   Ceclor [cefaclor], Sulfa antibiotics, and Misc. sulfonamide containing compounds   Social History   Socioeconomic History   Marital status: Single    Spouse name: Not on file   Number of children: Not on file   Years of education: Not on file   Highest education level: Not on file  Occupational History   Not on file  Tobacco Use   Smoking status: Every Day    Packs/day: 1.50    Years: 10.00    Total pack years: 15.00    Types: Cigarettes    Start date: 04/29/1997   Smokeless tobacco: Never  Substance and Sexual Activity   Alcohol use: Yes    Alcohol/week: 0.0 standard drinks of alcohol    Comment: occasional once a month   Drug use: No   Sexual activity: Not Currently  Other Topics Concern   Not  on file  Social History Narrative   05/21/2012 Tim [name] was born and grew up in [place], [place]. He reports he had a good childhood. He has one sister who is 3 years younger than he. His parents are still married. He graduated from high school and attended [employer] studying information systems for 2 years but did not turn a degree. In high school he was a member of the [employer]. 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 He has never been married he has one son who is currently 52 years old. He shares custody with his son's mother, but the patient keeps his son most of the time. He denies any current legal issues. He reports that his social support system consists of his mother, father, and sister. He currently lives with his family. 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 He reports that he believes in God but he does not practice any religion. 05/21/2012 Tim   Social Determinants of Health   Financial Resource Strain: Not on file  Food Insecurity: Not on file  Transportation Needs: Not on file  Physical Activity: Not on file  Stress: Not on file  Social Connections: Not on file     Family History: The patient's family history includes ADD / ADHD in his son; COPD in his father; Cancer in his maternal grandmother, paternal grandmother, and paternal uncle; Diabetes in his father; Hypertension in his father and mother.  ROS:   Please see the history of present illness.     All other systems reviewed and are negative.  EKGs/Labs/Other Studies Reviewed:    The following studies were reviewed today:   EKG:  EKG is  ordered today.  The ekg ordered today demonstrates  05/10/2022: Marked sinus bradycardia rate 47with 1st HB PR 232, TWI, iRBBB      Recent Labs: 04/04/2022: ALT 19; BUN 15; Creat 1.47; Hemoglobin 12.8; Platelets 238; Potassium 4.5; Sodium 136  Recent Lipid Panel    Component Value Date/Time   CHOL  140 04/04/2022 0803   TRIG 96 04/04/2022 0803   HDL 40 04/04/2022 0803   CHOLHDL 3.5 04/04/2022 0803   VLDL 39 (H) 08/28/2015 1147   LDLCALC 81 04/04/2022 0803        Physical Exam:    VS:  BP 130/78   Pulse (!) 47   Ht '5\' 8"'$  (1.727 m)   Wt 237 lb (107.5 kg)   SpO2 99%  BMI 36.04 kg/m     Wt Readings from Last 3 Encounters:  05/10/22 237 lb (107.5 kg)  04/11/22 232 lb 9.6 oz (105.5 kg)  09/20/21 242 lb (109.8 kg)    GEN:  Appears older that stated age, obesity HEENT: Normal NECK: No JVD LYMPHATICS: No lymphadenopathy CARDIAC: RRR, no murmurs, rubs, gallops RESPIRATORY:  Clear to auscultation without rales, wheezing or rhonchi  ABDOMEN: Soft, non-tender, non-distended MUSCULOSKELETAL:  No edema; No deformity  SKIN: Warm and dry NEUROLOGIC:  Alert and oriented x 3 PSYCHIATRIC:  Normal affect   ASSESSMENT:    1. Precordial pain   2. COPD with chronic bronchitis and emphysema ([place])   3. DOE (dyspnea on exertion)   4. Smoker   5. Mixed hyperlipidemia    PLAN:    Precordial CP DOE COPD Active smoker - DDX included CAD (25 pack year hx), COPD, and HF (less likely) - will given PRN nitro - will get CCTA - if still symptomatic and negative CT, will get echo - discussed smoking cessation  Orthostatic HTN - amb BP monitoring - if persistent cut lisinopril to 5 mg  HLD - no changes to therapy until after CT  [place] f/u me or APP      Medication Adjustments/Labs and Tests Ordered: Current medicines are reviewed at length with the patient today.  Concerns regarding medicines are outlined above.  Orders Placed This Encounter  Procedures   CT CORONARY MORPH W/CTA COR W/SCORE W/CA W/CM &/OR WO/CM   Basic metabolic panel   EKG 16-[id number]   Meds ordered this encounter  Medications   nitroGLYCERIN (NITROSTAT) 0.4 MG SL tablet    Sig: Place 1 tablet (0.4 mg total) under the tongue every 5 (five) minutes as needed for chest pain.    Dispense:  25 tablet     Refill:  3    Patient Instructions  Medication Instructions:  Your physician has recommended you make the following change in your medication:  START: Nitroglycerin 0.4 mg under your tongue as needed for Chest Pain  If you have chest pain place 1 tablet under your tongue, wait 5 min If chest pain continues place a 2nd tablet under your tongue, wait 5 min If chest pain continues place a 3rd tablet under your tongue, wait 5 min If chest pain continues call 911   *If you need a refill on your cardiac medications before your next appointment, please call your pharmacy*   Lab Work: TODAY: BMP   If you have labs (blood work) drawn today and your tests are completely normal, you will receive your results only by: [place] (if you have MyChart) OR A paper copy in the mail If you have any lab test that is abnormal or we need to change your treatment, we will call you to review the results.   Testing/Procedures: Your physician has requested that you have cardiac CT. Cardiac computed tomography (CT) is a painless test that uses an x-ray machine to take clear, detailed pictures of your heart. For further information please visit [email or web address]. Please follow instruction sheet as given.     Follow-Up: At [facility], you and your health needs are our priority.  As part of our continuing mission to provide you with exceptional heart care, we have created designated Provider Care Teams.  These Care Teams include your primary Cardiologist (physician) and Advanced Practice Providers (APPs -  Physician Assistants and Nurse Practitioners) who all work together to provide you with the care you  need, when you need it.  We recommend signing up for the patient portal called "MyChart".  Sign up information is provided on this After Visit Summary.  MyChart is used to connect with patients for Virtual Visits (Telemedicine).  Patients are able to view lab/test results, encounter notes,  upcoming appointments, etc.  Non-urgent messages can be sent to your provider as well.   To learn more about what you can do with MyChart, go to [email or web address].    Your next appointment:   3-4 month(s)  Provider:   Werner Lean, MD  or Nicholes Rough, PA-C, Ambrose Pancoast, NP, or Christen Bame, NP       Other Instructions  Your cardiac CT will be scheduled at one of the below locations:   [facility] [address] [place], [place] [address] [phone]  [place] [address] [place], [place] [address] [phone]  [facility] [place], [place] [address] [phone]  If scheduled at [facility], please arrive at the [facility] and Children's Entrance (Entrance C2) of [facility] 30 minutes prior to test start time. You can use the FREE valet parking offered at entrance C (encouraged to control the heart rate for the test)  Proceed to the [facility] Radiology Department (first floor) to check-in and test prep.  All radiology patients and guests should use entrance C2 at [facility], accessed from [facility], even though the hospital's physical address listed is [address].    If scheduled at [facility] or [facility], please arrive 15 mins early for check-in and test prep.   Please follow these instructions carefully (unless otherwise directed):  Hold all erectile dysfunction medications at least 3 days (72 hrs) prior to test. (Ie viagra, cialis, sildenafil, tadalafil, etc) We will administer nitroglycerin during this exam.   On the Night Before the Test: Be sure to Drink plenty of water. Do not consume any caffeinated/decaffeinated beverages or chocolate 12 hours prior to your test. Do not take any antihistamines 12 hours prior to your test.   Loratadine (Claritin)    On the Day of the Test: Drink plenty of water until 1 hour prior to the test. Do not eat any food 1 hour prior to test. You may take your regular medications prior to the test.  Take metoprolol (Lopressor) two hours prior to test. HOLD Furosemide/Hydrochlorothiazide morning of the test.       After the Test: Drink plenty of water. After receiving IV contrast, you may experience a mild flushed feeling. This is normal. On occasion, you may experience a mild rash up to 24 hours after the test. This is not dangerous. If this occurs, you can take Benadryl 25 mg and increase your fluid intake. If you experience trouble breathing, this can be serious. If it is severe call 911 IMMEDIATELY. If it is mild, please call our office. If you take any of these medications: Glipizide/Metformin, Avandament, Glucavance, please do not take 48 hours after completing test unless otherwise instructed.  We will call to schedule your test 2-4 weeks out understanding that some insurance companies will need an authorization prior to the service being performed.   For non-scheduling related questions, please contact the cardiac imaging nurse navigator should you have any questions/concerns: Marchia Bond, Cardiac Imaging Nurse Navigator Gordy Clement, Cardiac Imaging Nurse Navigator [facility] Direct Office Dial: [phone]   For scheduling needs, including cancellations and rescheduling, please call [place], [phone].     Signed, Werner Lean, MD  05/10/2022 10:47 AM    Tim [name]

## 2022-05-10 ENCOUNTER — Other Ambulatory Visit: Payer: Self-pay | Admitting: Family Medicine

## 2022-05-10 ENCOUNTER — Encounter: Payer: Self-pay | Admitting: Internal Medicine

## 2022-05-10 ENCOUNTER — Ambulatory Visit: Payer: 59 | Attending: Internal Medicine | Admitting: Internal Medicine

## 2022-05-10 VITALS — BP 130/78 | HR 47 | Ht 68.0 in | Wt 237.0 lb

## 2022-05-10 DIAGNOSIS — J439 Emphysema, unspecified: Secondary | ICD-10-CM

## 2022-05-10 DIAGNOSIS — F17200 Nicotine dependence, unspecified, uncomplicated: Secondary | ICD-10-CM

## 2022-05-10 DIAGNOSIS — J4489 Other specified chronic obstructive pulmonary disease: Secondary | ICD-10-CM

## 2022-05-10 DIAGNOSIS — R072 Precordial pain: Secondary | ICD-10-CM | POA: Diagnosis not present

## 2022-05-10 DIAGNOSIS — R0609 Other forms of dyspnea: Secondary | ICD-10-CM

## 2022-05-10 DIAGNOSIS — E782 Mixed hyperlipidemia: Secondary | ICD-10-CM | POA: Diagnosis not present

## 2022-05-10 LAB — BASIC METABOLIC PANEL
BUN/Creatinine Ratio: 9 (ref 9–20)
BUN: 13 mg/dL (ref 6–24)
CO2: 25 mmol/L (ref 20–29)
Calcium: 9.3 mg/dL (ref 8.7–10.2)
Chloride: 99 mmol/L (ref 96–106)
Creatinine, Ser: 1.43 mg/dL — ABNORMAL HIGH (ref 0.76–1.27)
Glucose: 102 mg/dL — ABNORMAL HIGH (ref 70–99)
Potassium: 4 mmol/L (ref 3.5–5.2)
Sodium: 135 mmol/L (ref 134–144)
eGFR: 64 mL/min/{1.73_m2} (ref >59)

## 2022-05-10 MED ORDER — NITROGLYCERIN 0.4 MG SL SUBL: 0.4000 mg | SUBLINGUAL_TABLET | SUBLINGUAL | 3 refills | Status: DC | PRN

## 2022-05-10 NOTE — Patient Instructions (Addendum)
Medication Instructions:  Your physician has recommended you make the following change in your medication:  START: Nitroglycerin 0.4 mg under your tongue as needed for Chest Pain  If you have chest pain place 1 tablet under your tongue, wait 5 min If chest pain continues place a 2nd tablet under your tongue, wait 5 min If chest pain continues place a 3rd tablet under your tongue, wait 5 min If chest pain continues call 911   *If you need a refill on your cardiac medications before your next appointment, please call your pharmacy*   Lab Work: TODAY: BMP   If you have labs (blood work) drawn today and your tests are completely normal, you will receive your results only by: [place] (if you have MyChart) OR A paper copy in the mail If you have any lab test that is abnormal or we need to change your treatment, we will call you to review the results.   Testing/Procedures: Your physician has requested that you have cardiac CT. Cardiac computed tomography (CT) is a painless test that uses an x-ray machine to take clear, detailed pictures of your heart. For further information please visit [email or web address]. Please follow instruction sheet as given.     Follow-Up: At [facility], you and your health needs are our priority.  As part of our continuing mission to provide you with exceptional heart care, we have created designated Provider Care Teams.  These Care Teams include your primary Cardiologist (physician) and Advanced Practice Providers (APPs -  Physician Assistants and Nurse Practitioners) who all work together to provide you with the care you need, when you need it.  We recommend signing up for the patient portal called "MyChart".  Sign up information is provided on this After Visit Summary.  MyChart is used to connect with patients for Virtual Visits (Telemedicine).  Patients are able to view lab/test results, encounter notes, upcoming appointments, etc.  Non-urgent  messages can be sent to your provider as well.   To learn more about what you can do with MyChart, go to [email or web address].    Your next appointment:   3-4 month(s)  Provider:   Werner Lean, MD  or Nicholes Rough, PA-C, Ambrose Pancoast, NP, or Christen Bame, NP       Other Instructions  Your cardiac CT will be scheduled at one of the below locations:   [facility] [address] [place], [place] [address] [phone]  [place] [address] [place], [place] [address] [phone]  [facility] [place], [place] [address] [phone]  If scheduled at [facility], please arrive at the [facility] and Children's Entrance (Entrance C2) of [facility] 30 minutes prior to test start time. You can use the FREE valet parking offered at entrance C (encouraged to control the heart rate for the test)  Proceed to the [facility] Radiology Department (first floor) to check-in and test prep.  All radiology patients and guests should use entrance C2 at [facility], accessed from [facility], even though the hospital's physical address listed is [address]    If scheduled at [facility] or [facility] please arrive 15 mins early for check-in and test prep.   Please follow these instructions carefully (unless otherwise directed):  Hold all erectile dysfunction medications at least 3 days (72 hrs) prior to test. (  Ie viagra, cialis, sildenafil, tadalafil, etc) We will administer nitroglycerin during this exam.   On the Night Before the Test: Be sure to Drink plenty of water. Do not consume any caffeinated/decaffeinated beverages or chocolate 12 hours prior to your test. Do not take any antihistamines 12 hours prior to your test.  Loratadine (Claritin)    On the Day of  the Test: Drink plenty of water until 1 hour prior to the test. Do not eat any food 1 hour prior to test. You may take your regular medications prior to the test.  Take metoprolol (Lopressor) two hours prior to test. HOLD Furosemide/Hydrochlorothiazide morning of the test.       After the Test: Drink plenty of water. After receiving IV contrast, you may experience a mild flushed feeling. This is normal. On occasion, you may experience a mild rash up to 24 hours after the test. This is not dangerous. If this occurs, you can take Benadryl 25 mg and increase your fluid intake. If you experience trouble breathing, this can be serious. If it is severe call 911 IMMEDIATELY. If it is mild, please call our office. If you take any of these medications: Glipizide/Metformin, Avandament, Glucavance, please do not take 48 hours after completing test unless otherwise instructed.  We will call to schedule your test 2-4 weeks out understanding that some insurance companies will need an authorization prior to the service being performed.   For non-scheduling related questions, please contact the cardiac imaging nurse navigator should you have any questions/concerns: Marchia Bond, Cardiac Imaging Nurse Navigator Gordy Clement, Cardiac Imaging Nurse Navigator [facility] Direct Office Dial: [phone]   For scheduling needs, including cancellations and rescheduling, please call [place], [phone].

## 2022-05-10 NOTE — Telephone Encounter (Signed)
Requested Prescriptions  Pending Prescriptions Disp Refills   fenofibrate (TRICOR) 145 MG tablet [Pharmacy Med Name: FENOFIBRATE 145 MG TABLET] 90 tablet 0    Sig: TAKE 1 TABLET BY MOUTH EVERY DAY     Cardiovascular:  Antilipid - Fibric Acid Derivatives Failed - 05/10/2022 11:54 AM      Failed - Cr in normal range and within 360 days    Creat  Date Value Ref Range Status  04/04/2022 1.47 (H) 0.60 - 1.26 mg/dL Final   Creatinine, Urine  Date Value Ref Range Status  04/11/2022 126 20 - 320 mg/dL Final         Failed - HGB in normal range and within 360 days    Hemoglobin  Date Value Ref Range Status  04/04/2022 12.8 (L) 13.2 - 17.1 g/dL Final         Failed - HCT in normal range and within 360 days    HCT  Date Value Ref Range Status  04/04/2022 37.3 (L) 38.5 - 50.0 % Final         Failed - Lipid Panel in normal range within the last 12 months    Cholesterol  Date Value Ref Range Status  04/04/2022 140 <200 mg/dL Final   LDL Cholesterol (Calc)  Date Value Ref Range Status  04/04/2022 81 mg/dL (calc) Final    Comment:    Reference range: <100 . Desirable range <100 mg/dL for primary prevention;   <70 mg/dL for patients with CHD or diabetic patients  with > or = 2 CHD risk factors. [place] LDL-C is now calculated using the Martin-Hopkins  calculation, which is a validated novel method providing  better accuracy than the Friedewald equation in the  estimation of LDL-C.  Cresenciano Genre et al. Annamaria Helling. 6734;193(79): 2061-2068  (http://education.QuestDiagnostics.com/faq/FAQ164)    HDL  Date Value Ref Range Status  04/04/2022 40 > OR = 40 mg/dL Final   Triglycerides  Date Value Ref Range Status  04/04/2022 96 <150 mg/dL Final         Passed - ALT in normal range and within 360 days    ALT  Date Value Ref Range Status  04/04/2022 19 9 - 46 U/L Final         Passed - AST in normal range and within 360 days    AST  Date Value Ref Range Status  04/04/2022 18 10 - 40 U/L  Final         Passed - PLT in normal range and within 360 days    Platelets  Date Value Ref Range Status  04/04/2022 238 140 - 400 Thousand/uL Final         Passed - WBC in normal range and within 360 days    WBC  Date Value Ref Range Status  04/04/2022 5.7 3.8 - 10.8 Thousand/uL Final         Passed - eGFR is 30 or above and within 360 days    GFR, Est African American  Date Value Ref Range Status  11/15/2019 75 > OR = 60 mL/min/1.25m Final   GFR, Est Non African American  Date Value Ref Range Status  11/15/2019 65 > OR = 60 mL/min/1.771mFinal   GFR, Estimated  Date Value Ref Range Status  05/22/2020 >60 >60 mL/min Final    Comment:    (NOTE) Calculated using the CKD-EPI Creatinine Equation (2021)    eGFR  Date Value Ref Range Status  04/04/2022 62 > OR = 60 mL/min/1.7370minal  Passed - Valid encounter within last 12 months    Recent Outpatient Visits           7 months ago COPD with acute exacerbation ([place])   [place] [facility], Cammie Mcgee, MD   1 year ago General medical exam   [place] Susy Frizzle, MD   1 year ago COPD with acute exacerbation [facility])   [facility] Dennard Schaumann Cammie Mcgee, MD   1 year ago Orthostatic hypotension   [place] Dennard Schaumann, Cammie Mcgee, MD   1 year ago Orthostatic hypotension   [place], NP       Future Appointments             In 3 months Barbarann Ehlers, Junius Creamer., NP [facility]

## 2022-05-15 ENCOUNTER — Telehealth (HOSPITAL_COMMUNITY): Payer: Self-pay | Admitting: *Deleted

## 2022-05-15 NOTE — Telephone Encounter (Signed)
Reaching out to patient to offer assistance regarding upcoming cardiac imaging study; pt verbalizes understanding of appt date/time, parking situation and where to check in, pre-test NPO status and verified current allergies; name and call back number provided for further questions should they arise  Gordy Clement RN Navigator Cardiac [place] [facility] [phone] office [phone] cell  Patient to arrive at 7:30am.

## 2022-05-16 ENCOUNTER — Ambulatory Visit (HOSPITAL_COMMUNITY)
Admission: RE | Admit: 2022-05-16 | Discharge: 2022-05-16 | Disposition: A | Discharge status: Home / Self Care | Payer: 59 | Source: Ambulatory Visit | Attending: Internal Medicine | Admitting: Internal Medicine

## 2022-05-16 DIAGNOSIS — R072 Precordial pain: Secondary | ICD-10-CM | POA: Diagnosis not present

## 2022-05-16 MED ORDER — IOHEXOL 350 MG/ML SOLN
100.0000 mL | Freq: Once | INTRAVENOUS | Status: AC | PRN
  Administered 2022-05-16: 100 mL via INTRAVENOUS

## 2022-05-16 MED ORDER — NITROGLYCERIN 0.4 MG SL SUBL
SUBLINGUAL_TABLET | SUBLINGUAL | Status: AC
  Filled 2022-05-16: qty 2

## 2022-05-16 MED ORDER — NITROGLYCERIN 0.4 MG SL SUBL
0.8000 mg | SUBLINGUAL_TABLET | Freq: Once | SUBLINGUAL | Status: AC
  Administered 2022-05-16: 0.8 mg via SUBLINGUAL

## 2022-05-20 DIAGNOSIS — M5416 Radiculopathy, lumbar region: Secondary | ICD-10-CM | POA: Insufficient documentation

## 2022-06-25 ENCOUNTER — Other Ambulatory Visit: Payer: Self-pay | Admitting: Family Medicine

## 2022-06-25 MED ORDER — SYMBICORT 160-4.5 MCG/ACT IN AERO: 2.0000 | INHALATION_SPRAY | Freq: Two times a day (BID) | RESPIRATORY_TRACT | 1 refills | Status: DC

## 2022-06-25 NOTE — Telephone Encounter (Signed)
Original Rx 05/30/21 3 each 4RF- patient is uo to date on visits Requested Prescriptions  Pending Prescriptions Disp Refills   SYMBICORT 160-4.5 MCG/ACT inhaler 3 each 4    Sig: Inhale 2 puffs into the lungs 2 (two) times daily.     Pulmonology:  Combination Products Passed - 06/25/2022  2:56 PM      Passed - Valid encounter within last 12 months    Recent Outpatient Visits           9 months ago COPD with acute exacerbation ([place])   [facility], Cammie Mcgee, MD   1 year ago General medical exam   [place] Susy Frizzle, MD   1 year ago COPD with acute exacerbation [facility])   [facility] Susy Frizzle, MD   2 years ago Orthostatic hypotension   [place] Dennard Schaumann, Cammie Mcgee, MD   2 years ago Orthostatic hypotension   [place] Eulogio Bear, NP       Future Appointments             In 1 month Barbarann Ehlers, Junius Creamer., NP [place] [facility], [place]

## 2022-06-25 NOTE — Telephone Encounter (Signed)
Prescription Request  06/25/2022  Is this a "Controlled Substance" medicine? No  LOV: 04/11/2022  What is the name of the medication or equipment?   [id number]  Have you contacted your pharmacy to request a refill? Yes   Which pharmacy would you like this sent to?   [id number]Phone: [phone] Fax: [phone] #: [id number]   Patient notified that their request is being sent to the clinical staff for review and that they should receive a response within 2 business days.   Please advise pharmacist at [phone]

## 2022-06-25 NOTE — Telephone Encounter (Signed)
Refill requested of SYMBICORT 160-4.5 MCG/ACT inhaler [id number]   Please advise pharmacist.

## 2022-07-18 ENCOUNTER — Other Ambulatory Visit: Payer: Self-pay | Admitting: Family Medicine

## 2022-08-08 ENCOUNTER — Other Ambulatory Visit: Payer: Self-pay | Admitting: Family Medicine

## 2022-08-08 NOTE — Telephone Encounter (Signed)
OV 04/11/22- provider is aware of lab results Requested Prescriptions  Pending Prescriptions Disp Refills   fenofibrate (TRICOR) 145 MG tablet [Pharmacy Med Name: FENOFIBRATE 145 MG TABLET] 90 tablet 0    Sig: TAKE 1 TABLET BY MOUTH EVERY DAY     Cardiovascular:  Antilipid - Fibric Acid Derivatives Failed - 08/08/2022  1:52 AM      Failed - Cr in normal range and within 360 days    Creat  Date Value Ref Range Status  04/04/2022 1.47 (H) 0.60 - 1.26 mg/dL Final   Creatinine, Ser  Date Value Ref Range Status  05/10/2022 1.43 (H) 0.76 - 1.27 mg/dL Final   Creatinine, Urine  Date Value Ref Range Status  04/11/2022 126 20 - 320 mg/dL Final         Failed - HGB in normal range and within 360 days    Hemoglobin  Date Value Ref Range Status  04/04/2022 12.8 (L) 13.2 - 17.1 g/dL Final         Failed - HCT in normal range and within 360 days    HCT  Date Value Ref Range Status  04/04/2022 37.3 (L) 38.5 - 50.0 % Final         Failed - Lipid Panel in normal range within the last 12 months    Cholesterol  Date Value Ref Range Status  04/04/2022 140 <200 mg/dL Final   LDL Cholesterol (Calc)  Date Value Ref Range Status  04/04/2022 81 mg/dL (calc) Final    Comment:    Reference range: <100 . Desirable range <100 mg/dL for primary prevention;   <70 mg/dL for patients with CHD or diabetic patients  with > or = 2 CHD risk factors. Marland Kitchen LDL-C is now calculated using the Martin-Hopkins  calculation, which is a validated novel method providing  better accuracy than the Friedewald equation in the  estimation of LDL-C.  Horald Pollen et al. Lenox Ahr. 2376;283(15): 2061-2068  (http://education.[facility].com/faq/FAQ164)    HDL  Date Value Ref Range Status  04/04/2022 40 > OR = 40 mg/dL Final   Triglycerides  Date Value Ref Range Status  04/04/2022 96 <150 mg/dL Final         Passed - ALT in normal range and within 360 days    ALT  Date Value Ref Range Status  04/04/2022 19 9 -  46 U/L Final         Passed - AST in normal range and within 360 days    AST  Date Value Ref Range Status  04/04/2022 18 10 - 40 U/L Final         Passed - PLT in normal range and within 360 days    Platelets  Date Value Ref Range Status  04/04/2022 238 140 - 400 Thousand/uL Final         Passed - WBC in normal range and within 360 days    WBC  Date Value Ref Range Status  04/04/2022 5.7 3.8 - 10.8 Thousand/uL Final         Passed - eGFR is 30 or above and within 360 days    GFR, Est African American  Date Value Ref Range Status  11/15/2019 75 > OR = 60 mL/min/1.69m2 Final   GFR, Est Non African American  Date Value Ref Range Status  11/15/2019 65 > OR = 60 mL/min/1.78m2 Final   GFR, Estimated  Date Value Ref Range Status  05/22/2020 >60 >60 mL/min Final    Comment:    (  NOTE) Calculated using the CKD-EPI Creatinine Equation (2021)    eGFR  Date Value Ref Range Status  05/10/2022 64 >59 mL/min/1.73 Final         Passed - Valid encounter within last 12 months    Recent Outpatient Visits           10 months ago COPD with acute exacerbation (HCC)   [facility] Pickard, Priscille Heidelberg, MD   1 year ago General medical exam   [facility] Donita Brooks, MD   1 year ago COPD with acute exacerbation [facility])   [facility] Donita Brooks, MD   2 years ago Orthostatic hypotension   [facility] Tanya Nones, Priscille Heidelberg, MD   2 years ago Orthostatic hypotension   [facility] Valentino Nose, NP       Future Appointments             In 1 week Louanne Skye, Devoria Albe., NP [facility] at [facility], [facility]

## 2022-08-09 ENCOUNTER — Other Ambulatory Visit: Payer: Self-pay | Admitting: Family Medicine

## 2022-08-09 DIAGNOSIS — J209 Acute bronchitis, unspecified: Secondary | ICD-10-CM

## 2022-08-09 NOTE — Telephone Encounter (Signed)
Requested Prescriptions  Pending Prescriptions Disp Refills   albuterol (VENTOLIN HFA) 108 (90 Base) MCG/ACT inhaler [Pharmacy Med Name: ALBUTEROL HFA (PROAIR) INHALER] 8.5 each 0    Sig: INHALE 2 PUFFS INTO THE LUNGS UP TO EVERY 6HRS AS NEEDED FOR WHEEZING/SHORTNESS OF BREATH     Pulmonology:  Beta Agonists 2 Passed - 08/09/2022  2:23 AM      Passed - Last BP in normal range    BP Readings from Last 1 Encounters:  05/16/22 123/74         Passed - Last Heart Rate in normal range    Pulse Readings from Last 1 Encounters:  05/16/22 (!) 57         Passed - Valid encounter within last 12 months    Recent Outpatient Visits           10 months ago COPD with acute exacerbation (HCC)   [facility] Pickard, Priscille Heidelberg, MD   1 year ago General medical exam   [facility] Donita Brooks, MD   1 year ago COPD with acute exacerbation [facility])   [facility] Donita Brooks, MD   2 years ago Orthostatic hypotension   [facility] Tanya Nones, Priscille Heidelberg, MD   2 years ago Orthostatic hypotension   [facility] Valentino Nose, NP       Future Appointments             In 1 week Louanne Skye, Devoria Albe., NP [facility] at [facility], [facility]

## 2022-08-18 ENCOUNTER — Telehealth: Payer: Self-pay | Admitting: Internal Medicine

## 2022-08-18 NOTE — Telephone Encounter (Signed)
Heartflow has sent [place] novel information from his CT about soft plaque. Minimal soft plaque (6 cubic mm)  Did you add a medication? No.  ?If no, reason? Reason for not adding med: Other LDL at goal  Did you remove a medication? No.  Did you increase the dosage of any medication? No.  Did you decrease the dosage of any medication? No.  Did you refer to a specialist (i.e. lipid clinic, preventive cardiology, endocrinology)? No.  Has patient seen plaque report? No.  At goal on current therapy.  No changes.  Riley Lam, MD FASE [facility] Cardiologist [facility]  [address] [place], [address] [place], [place] [address] [phone]  2:50 PM

## 2022-08-20 NOTE — Progress Notes (Unsigned)
Office Visit    Patient Name: Tim [name]. Date of Encounter: 08/20/2022  Primary Care Provider:  Donita Brooks, MD Primary Cardiologist:  Christell Constant, MD Primary Electrophysiologist: None   Past Medical History    Past Medical History:  Diagnosis Date   Avascular necrosis of bone of right hip (HCC)    Cervicalgia    COPD (chronic obstructive pulmonary disease) (HCC)    GAD (generalized anxiety disorder)    Hypertension    Hypertriglyceridemia    Lumbago    Lumbosacral spondylosis without myelopathy    Myalgia and myositis, unspecified    Other symptoms referable to back    PSA (psoriatic arthritis) (HCC)    Smoker    Thoracic spondylosis without myelopathy    Past Surgical History:  Procedure Laterality Date   DISTAL CLAVICLE EXCISION Right 2017   ROTATOR CUFF REPAIR  2010   right   TONSILLECTOMY AND ADENOIDECTOMY  Age 70    Allergies  Allergies  Allergen Reactions   Ceclor [Cefaclor]    Sulfa Antibiotics    Misc. Sulfonamide Containing Compounds Rash     History of Present Illness    Tim [name].  is a 42 year old male with a PMH of HTN, HLD, tobacco abuse, COPD who presents today for 63-month follow-up.  He was seen initially by Dr. Izora Ribas on 05/10/2022 by referral of his PCP for complaint of shortness of breath.  He also noted precordial chest pain that improves with rest.  Cardiac CTA was completed on 05/16/2022 with calcium score of 0 and no evidence of CAD.  Since last being seen in the office patient reports***.  Patient denies chest pain, palpitations, dyspnea, PND, orthopnea, nausea, vomiting, dizziness, syncope, edema, weight gain, or early satiety.     ***Notes:  Home Medications    Current Outpatient Medications  Medication Sig Dispense Refill   albuterol (VENTOLIN HFA) 108 (90 Base) MCG/ACT inhaler INHALE 2 PUFFS INTO THE LUNGS UP TO EVERY 6HRS AS NEEDED FOR WHEEZING/SHORTNESS OF BREATH 8.5 each 0   buPROPion  (WELLBUTRIN XL) 150 MG 24 hr tablet Take 150 mg by mouth daily.     fenofibrate (TRICOR) 145 MG tablet TAKE 1 TABLET BY MOUTH EVERY DAY 90 tablet 0   fluvoxaMINE (LUVOX) 50 MG tablet Take 75 mg by mouth at bedtime. Take  and  daily ( )     gabapentin (NEURONTIN) 300 MG capsule Take 300 mg by mouth 3 (three) times daily.   2   ketoconazole (NIZORAL) 2 % cream Apply 1 application topically daily. 30 g 3   lisinopril (ZESTRIL) 10 MG tablet TAKE 1 TABLET BY MOUTH EVERY DAY 90 tablet 0   loratadine (CLARITIN) 10 MG tablet Take 10 mg by mouth daily.     nitroGLYCERIN (NITROSTAT) 0.4 MG SL tablet Place 1 tablet (0.4 mg total) under the tongue every 5 (five) minutes as needed for chest pain. 25 tablet 3   Oxycodone HCl 10 MG TABS Take 10 mg by mouth 4 (four) times daily.      QUEtiapine (SEROQUEL) 400 MG tablet Take 400 mg by mouth at bedtime.     SYMBICORT 160-4.5 MCG/ACT inhaler Inhale 2 puffs into the lungs 2 (two) times daily. 3 each 1   tiZANidine (ZANAFLEX) 4 MG tablet Take 6 mg by mouth 3 (three) times daily. Take 1-1/2 tabs 3x daily     traZODone (DESYREL) 100 MG tablet Take 100 mg by mouth at bedtime.  No current facility-administered medications for this visit.     Review of Systems  Please see the history of present illness.    (+)*** (+)***  All other systems reviewed and are otherwise negative except as noted above.  Physical Exam    Wt Readings from Last 3 Encounters:  05/10/22 237 lb (107.5 kg)  04/11/22 232 lb 9.6 oz (105.5 kg)  09/20/21 242 lb (109.8 kg)   AV:[id number] were no vitals filed for this visit.,There is no height or weight on file to calculate BMI.  Constitutional:      Appearance: Healthy appearance. Not in distress.  Neck:     Vascular: JVD normal.  Pulmonary:     Effort: Pulmonary effort is normal.     Breath sounds: No wheezing. No rales. Diminished in the bases Cardiovascular:     Normal rate. Regular rhythm. Normal S1. Normal S2.       Murmurs: There is no murmur.  Edema:    Peripheral edema absent.  Abdominal:     Palpations: Abdomen is soft non tender. There is no hepatomegaly.  Skin:    General: Skin is warm and dry.  Neurological:     General: No focal deficit present.     Mental Status: Alert and oriented to person, place and time.     Cranial Nerves: Cranial nerves are intact.  EKG/LABS/ Recent Cardiac Studies    ECG personally reviewed by me today - ***  Cardiac Studies & Procedures          CT SCANS  CT CORONARY MORPH W/CTA COR W/SCORE 05/16/2022  Addendum 05/16/2022 10:38 AM ADDENDUM REPORT: 05/16/2022 10:36  EXAM: OVER-READ INTERPRETATION  CT CHEST  The following report is an over-read performed by radiologist Dr. Lesia Hausen [facility] on 05/16/2022. This over-read does not include interpretation of cardiac or coronary anatomy or pathology. The coronary CTA interpretation by the cardiologist is attached.  COMPARISON:  04/20/2007 chest CT.  FINDINGS: Cardiovascular: Top-normal heart size. No significant pericardial effusion/thickening. Great vessels are normal in course and caliber. No central pulmonary emboli.  Mediastinum/Nodes: Unremarkable esophagus. No pathologically enlarged mediastinal or hilar lymph nodes.  Lungs/Pleura: No pneumothorax. No pleural effusion. No acute consolidative airspace disease, lung masses or significant pulmonary nodules.  Upper abdomen: No acute abnormality.  Musculoskeletal: No aggressive appearing focal osseous lesions. Mild thoracic spondylosis.  IMPRESSION: No significant extracardiac findings.   Electronically Signed By: Delbert Phenix M.D. On: 05/16/2022 10:36  Narrative HISTORY: Chest pain, nonspecific  EXAM: Cardiac/Coronary  CT  TECHNIQUE: The patient was scanned on a [employer].  PROTOCOL: A 120 kV prospective scan was triggered in the descending thoracic aorta at 111 HU's. Axial non-contrast 3 mm slices  were carried out through the heart. The data set was analyzed on a dedicated work station and scored using the Agatston method. Gantry rotation speed was 250 msecs and collimation was .6 mm. Beta blockade and 0.8 mg of sl NTG was given. The 3D data set was reconstructed in 5% intervals of the 35-75 % of the R-R cycle. Systolic and diastolic phases were analyzed on a dedicated work station using MPR, MIP and VRT modes. The patient received contrast: OMNIPAQUE IOHEXOL 350 MG/ML SOLN.  FINDINGS: Image quality: Good  Noise artifact is: Moderate misregistration  Coronary calcium score is 0.  Coronary arteries: Normal coronary origins.  Right dominance.  Right Coronary Artery: No detectable plaque or stenosis.  Left Main Coronary Artery: No detectable plaque or stenosis.  Left Anterior Descending Coronary Artery: No detectable plaque or stenosis.  Left Circumflex Artery: No detectable plaque or stenosis.  Aorta: Normal size, 30 mm at the mid ascending aorta (level of the PA bifurcation) measured double oblique.  Aortic Valve: No calcifications.  Tricuspid aortic valve.  Other findings:  Normal pulmonary vein drainage into the left atrium. Narrowing of LLPV likely due to draping and angulation, no definite distal lung findings.  Normal left atrial appendage without thrombus.  Normal size of the pulmonary artery.  Please see separate report from [facility] for non-cardiac findings.  IMPRESSION: 1. No evidence of CAD, 0% stenosis, CADRADS 0.  2. Coronary calcium score of 0.  3. Normal coronary origins with right dominance.  RECOMMENDATIONS: CAD-RADS 0. No evidence of CAD (0%). Consider non-atherosclerotic causes of chest pain.  Electronically Signed: By: Weston Brass M.D. On: 05/16/2022 10:02          Risk Assessment/Calculations:   {Does this patient have ATRIAL FIBRILLATION?[phone]}        Lab Results  Component Value Date   WBC  5.7 04/04/2022   HGB 12.8 (L) 04/04/2022   HCT 37.3 (L) 04/04/2022   MCV 90.3 04/04/2022   PLT 238 04/04/2022   Lab Results  Component Value Date   CREATININE 1.43 (H) 05/10/2022   BUN 13 05/10/2022   NA 135 05/10/2022   K 4.0 05/10/2022   CL 99 05/10/2022   CO2 25 05/10/2022   Lab Results  Component Value Date   ALT 19 04/04/2022   AST 18 04/04/2022   ALKPHOS 61 08/28/2015   BILITOT 0.3 04/04/2022   Lab Results  Component Value Date   CHOL 140 04/04/2022   HDL 40 04/04/2022   LDLCALC 81 04/04/2022   TRIG 96 04/04/2022   CHOLHDL 3.5 04/04/2022    Lab Results  Component Value Date   HGBA1C 5.5 11/24/2018     Assessment & Plan    1.  Precordial chest pain: -Today patient reports***  2.  Dyspnea on exertion: -Today patient reports***  3.  Hypertriglyceridemia: -Patient's last LDL cholesterol was*** -Continue***  4.  COPD: -Patient with 25 pack/year -Continue treatment plan per pulmonology  5.  Essential hypertension: -Patient's blood pressure today was*** -Continue***       Disposition: Follow-up with Christell Constant, MD or APP in *** months {Are you ordering a CV Procedure (e.g. stress test, cath, DCCV, TEE, etc)?   Press F2        :[id number]}   Medication Adjustments/Labs and Tests Ordered: Current medicines are reviewed at length with the patient today.  Concerns regarding medicines are outlined above.   Signed, Napoleon Form, Leodis Rains, NP 08/20/2022, 6:51 PM  [facility]

## 2022-08-21 ENCOUNTER — Encounter: Payer: Self-pay | Admitting: Nurse Practitioner

## 2022-08-21 ENCOUNTER — Ambulatory Visit: Payer: 59 | Attending: Nurse Practitioner | Admitting: Nurse Practitioner

## 2022-08-21 VITALS — BP 106/68 | HR 73 | Ht 68.0 in | Wt 235.6 lb

## 2022-08-21 DIAGNOSIS — I10 Essential (primary) hypertension: Secondary | ICD-10-CM | POA: Diagnosis not present

## 2022-08-21 DIAGNOSIS — E781 Pure hyperglyceridemia: Secondary | ICD-10-CM

## 2022-08-21 DIAGNOSIS — R0609 Other forms of dyspnea: Secondary | ICD-10-CM | POA: Diagnosis not present

## 2022-08-21 DIAGNOSIS — J4489 Other specified chronic obstructive pulmonary disease: Secondary | ICD-10-CM

## 2022-08-21 DIAGNOSIS — J439 Emphysema, unspecified: Secondary | ICD-10-CM

## 2022-08-21 DIAGNOSIS — R072 Precordial pain: Secondary | ICD-10-CM

## 2022-08-21 MED ORDER — OMEPRAZOLE 20 MG PO CPDR: 20.0000 mg | DELAYED_RELEASE_CAPSULE | Freq: Every day | ORAL | 10 refills | Status: DC

## 2022-08-21 NOTE — Patient Instructions (Addendum)
Medication Instructions:  START Prilosec  Take 1 tablet once a day *If you need a refill on your cardiac medications before your next appointment, please call your pharmacy*   Lab Work: None ordered   Testing/Procedures: Your physician has requested that you have a carotid duplex. This test is an ultrasound of the carotid arteries in your neck. It looks at blood flow through these arteries that supply the brain with blood. Allow one hour for this exam. There are no restrictions or special instructions.   Follow-Up: At [facility], you and your health needs are our priority.  As part of our continuing mission to provide you with exceptional heart care, we have created designated Provider Care Teams.  These Care Teams include your primary Cardiologist (physician) and Advanced Practice Providers (APPs -  Physician Assistants and Nurse Practitioners) who all work together to provide you with the care you need, when you need it.  We recommend signing up for the patient portal called "MyChart".  Sign up information is provided on this After Visit Summary.  MyChart is used to connect with patients for Virtual Visits (Telemedicine).  Patients are able to view lab/test results, encounter notes, upcoming appointments, etc.  Non-urgent messages can be sent to your provider as well.   To learn more about what you can do with MyChart, go to [email or web address].    Your next appointment:   12 month(s)  Provider:   Christell Constant, MD     Other Instructions

## 2022-08-22 ENCOUNTER — Other Ambulatory Visit: Payer: Self-pay | Admitting: Family Medicine

## 2022-08-22 ENCOUNTER — Encounter: Payer: Self-pay | Admitting: Family Medicine

## 2022-08-22 MED ORDER — AMOXICILLIN-POT CLAVULANATE 875-125 MG PO TABS: 1.0000 | ORAL_TABLET | Freq: Two times a day (BID) | ORAL | 0 refills | Status: DC

## 2022-08-23 ENCOUNTER — Ambulatory Visit (HOSPITAL_COMMUNITY)
Admission: RE | Admit: 2022-08-23 | Discharge: 2022-08-23 | Disposition: A | Discharge status: Home / Self Care | Payer: 59 | Source: Ambulatory Visit | Attending: Internal Medicine | Admitting: Internal Medicine

## 2022-08-23 DIAGNOSIS — R0609 Other forms of dyspnea: Secondary | ICD-10-CM | POA: Insufficient documentation

## 2022-08-23 DIAGNOSIS — J439 Emphysema, unspecified: Secondary | ICD-10-CM | POA: Diagnosis not present

## 2022-08-23 DIAGNOSIS — I10 Essential (primary) hypertension: Secondary | ICD-10-CM | POA: Insufficient documentation

## 2022-08-23 DIAGNOSIS — J4489 Other specified chronic obstructive pulmonary disease: Secondary | ICD-10-CM | POA: Insufficient documentation

## 2022-08-23 DIAGNOSIS — E781 Pure hyperglyceridemia: Secondary | ICD-10-CM | POA: Diagnosis not present

## 2022-08-23 DIAGNOSIS — R072 Precordial pain: Secondary | ICD-10-CM | POA: Diagnosis not present

## 2022-08-23 DIAGNOSIS — R0989 Other specified symptoms and signs involving the circulatory and respiratory systems: Secondary | ICD-10-CM | POA: Diagnosis not present

## 2022-08-30 ENCOUNTER — Other Ambulatory Visit: Payer: Self-pay | Admitting: Family Medicine

## 2022-08-30 DIAGNOSIS — J209 Acute bronchitis, unspecified: Secondary | ICD-10-CM

## 2022-09-03 DIAGNOSIS — Z79899 Other long term (current) drug therapy: Secondary | ICD-10-CM | POA: Diagnosis not present

## 2022-09-05 ENCOUNTER — Encounter (HOSPITAL_COMMUNITY): Payer: 59

## 2022-09-18 DIAGNOSIS — M25511 Pain in right shoulder: Secondary | ICD-10-CM | POA: Insufficient documentation

## 2022-09-18 DIAGNOSIS — M549 Dorsalgia, unspecified: Secondary | ICD-10-CM | POA: Diagnosis not present

## 2022-09-18 DIAGNOSIS — Z79891 Long term (current) use of opiate analgesic: Secondary | ICD-10-CM | POA: Diagnosis not present

## 2022-09-18 DIAGNOSIS — M5136 Other intervertebral disc degeneration, lumbar region: Secondary | ICD-10-CM | POA: Diagnosis not present

## 2022-09-18 DIAGNOSIS — G894 Chronic pain syndrome: Secondary | ICD-10-CM | POA: Diagnosis not present

## 2022-09-18 DIAGNOSIS — M47816 Spondylosis without myelopathy or radiculopathy, lumbar region: Secondary | ICD-10-CM | POA: Diagnosis not present

## 2022-09-18 DIAGNOSIS — M5459 Other low back pain: Secondary | ICD-10-CM | POA: Diagnosis not present

## 2022-09-18 DIAGNOSIS — M5030 Other cervical disc degeneration, unspecified cervical region: Secondary | ICD-10-CM | POA: Diagnosis not present

## 2022-09-18 DIAGNOSIS — M791 Myalgia, unspecified site: Secondary | ICD-10-CM | POA: Diagnosis not present

## 2022-09-18 DIAGNOSIS — M25512 Pain in left shoulder: Secondary | ICD-10-CM | POA: Diagnosis not present

## 2022-09-27 ENCOUNTER — Other Ambulatory Visit: Payer: Self-pay | Admitting: Family Medicine

## 2022-09-27 DIAGNOSIS — J209 Acute bronchitis, unspecified: Secondary | ICD-10-CM

## 2022-09-27 NOTE — Telephone Encounter (Signed)
Requested Prescriptions  Pending Prescriptions Disp Refills   albuterol (VENTOLIN HFA) 108 (90 Base) MCG/ACT inhaler [Pharmacy Med Name: ALBUTEROL HFA (PROAIR) INHALER] 8.5 each 2    Sig: INHALE 2 PUFFS INTO THE LUNGS UP TO EVERY 6HRS AS NEEDED FOR WHEEZING/SHORTNESS OF BREATH     Pulmonology:  Beta Agonists 2 Failed - 09/27/2022  2:40 AM      Failed - Valid encounter within last 12 months    Recent Outpatient Visits           1 year ago COPD with acute exacerbation (HCC)   [facility] Pickard, Priscille Heidelberg, MD   1 year ago General medical exam   [facility] Donita Brooks, MD   1 year ago COPD with acute exacerbation [facility])   [facility] Donita Brooks, MD   2 years ago Orthostatic hypotension   [facility] Tanya Nones, Priscille Heidelberg, MD   2 years ago Orthostatic hypotension   [facility] Cathlean Marseilles A, NP              Passed - Last BP in normal range    BP Readings from Last 1 Encounters:  08/21/22 106/68         Passed - Last Heart Rate in normal range    Pulse Readings from Last 1 Encounters:  08/21/22 73

## 2022-10-21 ENCOUNTER — Other Ambulatory Visit: Payer: Self-pay | Admitting: Family Medicine

## 2022-11-06 ENCOUNTER — Other Ambulatory Visit: Payer: Self-pay | Admitting: Family Medicine

## 2022-11-06 NOTE — Telephone Encounter (Signed)
Last OV 04/11/22 Requested Prescriptions  Pending Prescriptions Disp Refills   fenofibrate (TRICOR) 145 MG tablet [Pharmacy Med Name: FENOFIBRATE 145 MG TABLET] 90 tablet 0    Sig: TAKE 1 TABLET BY MOUTH EVERY DAY     Cardiovascular:  Antilipid - Fibric Acid Derivatives Failed - 11/06/2022  2:46 AM      Failed - Cr in normal range and within 360 days    Creat  Date Value Ref Range Status  04/04/2022 1.47 (H) 0.60 - 1.26 mg/dL Final   Creatinine, Ser  Date Value Ref Range Status  05/10/2022 1.43 (H) 0.76 - 1.27 mg/dL Final   Creatinine, Urine  Date Value Ref Range Status  04/11/2022 126 20 - 320 mg/dL Final         Failed - HGB in normal range and within 360 days    Hemoglobin  Date Value Ref Range Status  04/04/2022 12.8 (L) 13.2 - 17.1 g/dL Final         Failed - HCT in normal range and within 360 days    HCT  Date Value Ref Range Status  04/04/2022 37.3 (L) 38.5 - 50.0 % Final         Failed - Valid encounter within last 12 months    Recent Outpatient Visits           1 year ago COPD with acute exacerbation (HCC)   [facility] Pickard, Priscille Heidelberg, MD   1 year ago General medical exam   [facility] Donita Brooks, MD   1 year ago COPD with acute exacerbation [facility])   [facility] Tanya Nones, Priscille Heidelberg, MD   2 years ago Orthostatic hypotension   [facility] Tanya Nones, Priscille Heidelberg, MD   2 years ago Orthostatic hypotension   [facility] Cathlean Marseilles A, NP              Failed - Lipid Panel in normal range within the last 12 months    Cholesterol  Date Value Ref Range Status  04/04/2022 140 <200 mg/dL Final   LDL Cholesterol (Calc)  Date Value Ref Range Status  04/04/2022 81 mg/dL (calc) Final    Comment:    Reference range: <100 . Desirable range <100 mg/dL for primary prevention;   <70 mg/dL for patients with CHD or diabetic patients  with > or = 2 CHD risk  factors. Marland Kitchen LDL-C is now calculated using the Martin-Hopkins  calculation, which is a validated novel method providing  better accuracy than the Friedewald equation in the  estimation of LDL-C.  Horald Pollen et al. Lenox Ahr. 1610;960(45): 2061-2068  (http://education.[facility].com/faq/FAQ164)    HDL  Date Value Ref Range Status  04/04/2022 40 > OR = 40 mg/dL Final   Triglycerides  Date Value Ref Range Status  04/04/2022 96 <150 mg/dL Final         Passed - ALT in normal range and within 360 days    ALT  Date Value Ref Range Status  04/04/2022 19 9 - 46 U/L Final         Passed - AST in normal range and within 360 days    AST  Date Value Ref Range Status  04/04/2022 18 10 - 40 U/L Final         Passed - PLT in normal range and within 360 days    Platelets  Date Value Ref Range Status  04/04/2022 238 140 - 400 Thousand/uL  Final         Passed - WBC in normal range and within 360 days    WBC  Date Value Ref Range Status  04/04/2022 5.7 3.8 - 10.8 Thousand/uL Final         Passed - eGFR is 30 or above and within 360 days    GFR, Est African American  Date Value Ref Range Status  11/15/2019 75 > OR = 60 mL/min/1.59m2 Final   GFR, Est Non African American  Date Value Ref Range Status  11/15/2019 65 > OR = 60 mL/min/1.39m2 Final   GFR, Estimated  Date Value Ref Range Status  05/22/2020 >60 >60 mL/min Final    Comment:    (NOTE) Calculated using the CKD-EPI Creatinine Equation (2021)    eGFR  Date Value Ref Range Status  05/10/2022 64 >59 mL/min/1.73 Final

## 2022-12-25 ENCOUNTER — Other Ambulatory Visit: Payer: Self-pay | Admitting: Family Medicine

## 2022-12-26 ENCOUNTER — Encounter: Payer: Self-pay | Admitting: Family Medicine

## 2022-12-26 NOTE — Telephone Encounter (Signed)
Requested medication (s) are due for refill today: Yes  Requested medication (s) are on the active medication list: Yes  Last refill:  06/25/22  Future visit scheduled: No  Notes to clinic:  See request.    Requested Prescriptions  Pending Prescriptions Disp Refills   SYMBICORT 160-4.5 MCG/ACT inhaler [Pharmacy Med Name: SYMBICORT 160-4.5 MCG INHALER] 30.6 each 1    Sig: INHALE 2 PUFFS INTO THE LUNGS TWICE A DAY     Pulmonology:  Combination Products Failed - 12/25/2022  7:09 AM      Failed - Valid encounter within last 12 months    Recent Outpatient Visits           1 year ago COPD with acute exacerbation (HCC)   [facility] Pickard, Priscille Heidelberg, MD   1 year ago General medical exam   [facility] Donita Brooks, MD   1 year ago COPD with acute exacerbation [facility])   [facility] Donita Brooks, MD   2 years ago Orthostatic hypotension   [facility] Tanya Nones, Priscille Heidelberg, MD   2 years ago Orthostatic hypotension   [facility] Valentino Nose, NP

## 2022-12-27 ENCOUNTER — Other Ambulatory Visit: Payer: Self-pay

## 2022-12-27 MED ORDER — SYMBICORT 160-4.5 MCG/ACT IN AERO: 2.0000 | INHALATION_SPRAY | Freq: Two times a day (BID) | RESPIRATORY_TRACT | 0 refills | Status: DC

## 2023-01-13 ENCOUNTER — Ambulatory Visit (INDEPENDENT_AMBULATORY_CARE_PROVIDER_SITE_OTHER): Payer: 59 | Admitting: Family Medicine

## 2023-01-13 ENCOUNTER — Encounter: Payer: Self-pay | Admitting: Family Medicine

## 2023-01-13 VITALS — BP 128/74 | HR 70 | Temp 98.2°F | Ht 68.0 in | Wt 226.4 lb

## 2023-01-13 DIAGNOSIS — J209 Acute bronchitis, unspecified: Secondary | ICD-10-CM

## 2023-01-13 DIAGNOSIS — J439 Emphysema, unspecified: Secondary | ICD-10-CM | POA: Diagnosis not present

## 2023-01-13 DIAGNOSIS — I10 Essential (primary) hypertension: Secondary | ICD-10-CM | POA: Diagnosis not present

## 2023-01-13 DIAGNOSIS — N182 Chronic kidney disease, stage 2 (mild): Secondary | ICD-10-CM | POA: Diagnosis not present

## 2023-01-13 DIAGNOSIS — F411 Generalized anxiety disorder: Secondary | ICD-10-CM | POA: Diagnosis not present

## 2023-01-13 DIAGNOSIS — K219 Gastro-esophageal reflux disease without esophagitis: Secondary | ICD-10-CM

## 2023-01-13 DIAGNOSIS — J4489 Other specified chronic obstructive pulmonary disease: Secondary | ICD-10-CM

## 2023-01-13 DIAGNOSIS — E781 Pure hyperglyceridemia: Secondary | ICD-10-CM | POA: Diagnosis not present

## 2023-01-13 MED ORDER — LISINOPRIL 10 MG PO TABS: 10.0000 mg | ORAL_TABLET | Freq: Every day | ORAL | 1 refills | Status: DC

## 2023-01-13 MED ORDER — BUPROPION HCL ER (XL) 150 MG PO TB24: 150.0000 mg | ORAL_TABLET | Freq: Every day | ORAL | 1 refills | Status: DC

## 2023-01-13 MED ORDER — OMEPRAZOLE 20 MG PO CPDR: 20.0000 mg | DELAYED_RELEASE_CAPSULE | Freq: Every day | ORAL | 10 refills | Status: DC

## 2023-01-13 MED ORDER — FENOFIBRATE 145 MG PO TABS: 145.0000 mg | ORAL_TABLET | Freq: Every day | ORAL | 0 refills | Status: DC

## 2023-01-13 MED ORDER — ALBUTEROL SULFATE HFA 108 (90 BASE) MCG/ACT IN AERS
INHALATION_SPRAY | RESPIRATORY_TRACT | 2 refills | Status: DC
Start: 2023-01-13 — End: 2023-04-17

## 2023-01-13 MED ORDER — SYMBICORT 160-4.5 MCG/ACT IN AERO: 2.0000 | INHALATION_SPRAY | Freq: Two times a day (BID) | RESPIRATORY_TRACT | 0 refills | Status: DC

## 2023-01-13 NOTE — Addendum Note (Signed)
Addended by: Venia Carbon K on: 01/13/2023 10:32 AM   Modules accepted: Orders

## 2023-01-13 NOTE — Progress Notes (Signed)
Subjective:    Patient ID: Tim Post., male    DOB: 20-May-1980, 42 y.o.   MRN: [id number]  Patient is a 42 year old Caucasian gentleman with a history of hypertension, borderline stage III chronic kidney disease, tobacco abuse, dyslipidemia who presents today for follow-up of his chronic kidney disease.  His blood pressure today is excellent.  He seldom takes NSAIDs.  I emphasized the importance of him avoiding NSAIDs.  He denies chest pain or shortness of breath.   Past Medical History:  Diagnosis Date   Avascular necrosis of bone of right hip (HCC)    Cervicalgia    COPD (chronic obstructive pulmonary disease) (HCC)    GAD (generalized anxiety disorder)    Hypertension    Hypertriglyceridemia    Lumbago    Lumbosacral spondylosis without myelopathy    Myalgia and myositis, unspecified    Other symptoms referable to back    PSA (psoriatic arthritis) (HCC)    Smoker    Thoracic spondylosis without myelopathy    Past Surgical History:  Procedure Laterality Date   DISTAL CLAVICLE EXCISION Right 2017   ROTATOR CUFF REPAIR  2010   right   TONSILLECTOMY AND ADENOIDECTOMY  Age 62   Current Outpatient Medications on File Prior to Visit  Medication Sig Dispense Refill   albuterol (VENTOLIN HFA) 108 (90 Base) MCG/ACT inhaler INHALE 2 PUFFS INTO THE LUNGS UP TO EVERY 6HRS AS NEEDED FOR WHEEZING/SHORTNESS OF BREATH 8.5 each 2   amoxicillin-clavulanate (AUGMENTIN) 875-125 MG tablet Take 1 tablet by mouth 2 (two) times daily. (Patient not taking: Reported on 01/13/2023) 20 tablet 0   buPROPion (WELLBUTRIN XL) 150 MG 24 hr tablet Take 150 mg by mouth daily.     fenofibrate (TRICOR) 145 MG tablet TAKE 1 TABLET BY MOUTH EVERY DAY 90 tablet 0   fluvoxaMINE (LUVOX) 50 MG tablet Take 75 mg by mouth at bedtime. Take 50mg  and 25mg  daily (75Mg )     ketoconazole (NIZORAL) 2 % cream Apply 1 application topically daily. 30 g 3   lisinopril (ZESTRIL) 10 MG tablet TAKE 1 TABLET BY MOUTH EVERY DAY  90 tablet 1   loratadine (CLARITIN) 10 MG tablet Take 10 mg by mouth daily.     naloxone (NARCAN) nasal spray 4 mg/0.1 mL Place 0.4 mg into the nose once.     nitroGLYCERIN (NITROSTAT) 0.4 MG SL tablet Place 1 tablet (0.4 mg total) under the tongue every 5 (five) minutes as needed for chest pain. 25 tablet 3   omeprazole (PRILOSEC) 20 MG capsule Take 1 capsule (20 mg total) by mouth daily. 30 capsule 10   Oxycodone HCl 10 MG TABS Take 10 mg by mouth 4 (four) times daily.      pregabalin (LYRICA) 75 MG capsule Take 75 mg by mouth 2 (two) times daily.     QUEtiapine (SEROQUEL) 400 MG tablet Take 400 mg by mouth at bedtime.     SYMBICORT 160-4.5 MCG/ACT inhaler Inhale 2 puffs into the lungs 2 (two) times daily. Courtesy refill. Pt need cpe w/pcp for future refills. 2 each 0   tiZANidine (ZANAFLEX) 4 MG tablet Take 6 mg by mouth 3 (three) times daily. Take 1-1/2 tabs 3x daily     traZODone (DESYREL) 100 MG tablet Take 100 mg by mouth at bedtime.     No current facility-administered medications on file prior to visit.   Allergies  Allergen Reactions   Ceclor [Cefaclor]    Sulfa Antibiotics    Misc. Sulfonamide Containing  Compounds Rash   Social History   Socioeconomic History   Marital status: Single    Spouse name: Not on file   Number of children: Not on file   Years of education: Not on file   Highest education level: Not on file  Occupational History   Not on file  Tobacco Use   Smoking status: Every Day    Current packs/day: 1.50    Average packs/day: 1.5 packs/day for 25.7 years (38.6 ttl pk-yrs)    Types: Cigarettes    Start date: 04/29/1997   Smokeless tobacco: Never  Substance and Sexual Activity   Alcohol use: Yes    Alcohol/week: 0.0 standard drinks of alcohol    Comment: occasional once a month   Drug use: No   Sexual activity: Not Currently  Other Topics Concern   Not on file  Social History Narrative   05/21/2012 AHW [name] was born and grew up in [place], [place]. He reports he had a good childhood. He has one sister who is 3 years younger than he. His parents are still married. He graduated from high school and attended [employer] studying information systems for 2 years but did not turn a degree. In high school he was a member of the [employer]. 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 He has never been married he has one son who is currently 31 years old. He shares custody with his son's mother, but the patient keeps his son most of the time. He denies any current legal issues. He reports that his social support system consists of his mother, father, and sister. He currently lives with his family. 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 He reports that he believes in God but he does not practice any religion. 05/21/2012 AHW   Social Determinants of Health   Financial Resource Strain: Not on file  Food Insecurity: Not on file  Transportation Needs: Not on file  Physical Activity: Not on file  Stress: Not on file  Social Connections: Not on file  Intimate Partner Violence: Not on file    Review of Systems  All other systems reviewed and are negative.      Objective:   Physical Exam Vitals reviewed.  Constitutional:      General: He is not in acute distress.    Appearance: Normal appearance. He is obese. He is not ill-appearing, toxic-appearing or diaphoretic.  HENT:     Head: Normocephalic and atraumatic.     Right Ear: Tympanic membrane and ear canal normal. There is no impacted cerumen.     Left Ear: Tympanic membrane and ear canal normal. There is no impacted cerumen.     Nose: Nose normal. No congestion or rhinorrhea.     Mouth/Throat:     Mouth: Mucous membranes are moist.     Pharynx: Oropharynx is clear. No oropharyngeal exudate or posterior oropharyngeal erythema.  Eyes:     General: No scleral icterus.       Right eye: No discharge.        Left eye:  No discharge.     Extraocular Movements: Extraocular movements intact.     Conjunctiva/sclera: Conjunctivae normal.     Pupils: Pupils are equal, round, and reactive to light.  Neck:     Vascular: No carotid bruit.  Cardiovascular:     Rate and Rhythm: Normal rate and regular rhythm.     Heart sounds: Normal heart sounds. No murmur  heard.    No friction rub. No gallop.  Pulmonary:     Effort: Pulmonary effort is normal. No respiratory distress.     Breath sounds: Normal breath sounds. No stridor. No wheezing, rhonchi or rales.  Abdominal:     General: Abdomen is flat. Bowel sounds are normal. There is no distension.     Palpations: There is no mass.     Tenderness: There is no abdominal tenderness. There is no right CVA tenderness, left CVA tenderness, guarding or rebound.     Hernia: No hernia is present.  Musculoskeletal:     Cervical back: Normal range of motion. No rigidity or tenderness.     Right lower leg: No edema.     Left lower leg: No edema.  Lymphadenopathy:     Cervical: No cervical adenopathy.  Skin:    General: Skin is warm.     Coloration: Skin is not jaundiced.     Findings: No bruising, erythema, lesion or rash.  Neurological:     General: No focal deficit present.     Mental Status: He is alert and oriented to person, place, and time. Mental status is at baseline.     Cranial Nerves: No cranial nerve deficit.     Sensory: No sensory deficit.     Motor: No weakness.     Coordination: Coordination normal.     Gait: Gait normal.     Deep Tendon Reflexes: Reflexes normal.  Psychiatric:        Mood and Affect: Mood normal.        Behavior: Behavior normal.        Thought Content: Thought content normal.        Judgment: Judgment normal.           Assessment & Plan:   CKD (chronic kidney disease) stage 2, GFR 60-89 ml/min - Plan: CBC with Differential/Platelet, COMPLETE METABOLIC PANEL WITH GFR, Lipid panel, Protein / Creatinine Ratio, Urine  Benign  essential HTN Blood pressure today is well-controlled.  Check a urine protein to creatinine ratio.  Avoiding NSAIDs and controlling blood pressure is key to preventing further deterioration in his renal function.  Explained to the patient in detail.  Continue to recommend smoking cessation

## 2023-01-14 ENCOUNTER — Encounter: Payer: Self-pay | Admitting: Family Medicine

## 2023-01-14 LAB — CBC WITH DIFFERENTIAL/PLATELET
Absolute Monocytes: 365 {cells}/uL (ref 200–950)
Basophils Absolute: 29 cells/uL (ref 0–200)
Basophils Relative: 0.5 %
Eosinophils Absolute: 174 {cells}/uL (ref 15–500)
Eosinophils Relative: 3 %
HCT: 38.4 % — ABNORMAL LOW (ref 38.5–50.0)
Hemoglobin: 12.5 g/dL — ABNORMAL LOW (ref 13.2–17.1)
Lymphs Abs: 1752 cells/uL (ref 850–3900)
MCH: 30 pg (ref 27.0–33.0)
MCHC: 32.6 g/dL (ref 32.0–36.0)
MCV: 92.3 fL (ref 80.0–100.0)
MPV: 10.9 fL (ref 7.5–12.5)
Monocytes Relative: 6.3 %
Neutro Abs: 3480 {cells}/uL (ref 1500–7800)
Neutrophils Relative %: 60 %
Platelets: 237 10*3/uL (ref 140–400)
RBC: 4.16 10*6/uL — ABNORMAL LOW (ref 4.20–5.80)
RDW: 12.8 % (ref 11.0–15.0)
Total Lymphocyte: 30.2 %
WBC: 5.8 10*3/uL (ref 3.8–10.8)

## 2023-01-14 LAB — COMPLETE METABOLIC PANEL WITH GFR
AG Ratio: 2.1 (calc) (ref 1.0–2.5)
ALT: 18 U/L (ref 9–46)
AST: 18 U/L (ref 10–40)
Albumin: 4.8 g/dL (ref 3.6–5.1)
Alkaline phosphatase (APISO): 43 U/L (ref 36–130)
BUN/Creatinine Ratio: 13 (calc) (ref 6–22)
BUN: 17 mg/dL (ref 7–25)
CO2: 27 mmol/L (ref 20–32)
Calcium: 9.6 mg/dL (ref 8.6–10.3)
Chloride: 102 mmol/L (ref 98–110)
Creat: 1.35 mg/dL — ABNORMAL HIGH (ref 0.60–1.29)
Globulin: 2.3 g/dL (ref 1.9–3.7)
Glucose, Bld: 71 mg/dL (ref 65–99)
Potassium: 4.6 mmol/L (ref 3.5–5.3)
Sodium: 135 mmol/L (ref 135–146)
Total Bilirubin: 0.4 mg/dL (ref 0.2–1.2)
Total Protein: 7.1 g/dL (ref 6.1–8.1)
eGFR: 68 mL/min/{1.73_m2} (ref >=60)

## 2023-01-14 LAB — PROTEIN / CREATININE RATIO, URINE
Creatinine, Urine: 73 mg/dL (ref 20–320)
Protein/Creat Ratio: 68 mg/g{creat} (ref 25–148)
Protein/Creatinine Ratio: 0.068 mg/mg{creat} (ref 0.025–0.148)
Total Protein, Urine: 5 mg/dL (ref 5–25)

## 2023-01-14 LAB — LIPID PANEL
Cholesterol: 148 mg/dL (ref <200)
HDL: 41 mg/dL (ref >=40)
LDL Cholesterol (Calc): 86 mg/dL
Non-HDL Cholesterol (Calc): 107 mg/dL (ref <130)
Total CHOL/HDL Ratio: 3.6 (calc) (ref <5.0)
Triglycerides: 111 mg/dL (ref <150)

## 2023-01-20 DIAGNOSIS — M5030 Other cervical disc degeneration, unspecified cervical region: Secondary | ICD-10-CM | POA: Diagnosis not present

## 2023-01-20 DIAGNOSIS — M5459 Other low back pain: Secondary | ICD-10-CM | POA: Diagnosis not present

## 2023-01-20 DIAGNOSIS — S92355A Nondisplaced fracture of fifth metatarsal bone, left foot, initial encounter for closed fracture: Secondary | ICD-10-CM | POA: Diagnosis not present

## 2023-01-20 DIAGNOSIS — M549 Dorsalgia, unspecified: Secondary | ICD-10-CM | POA: Diagnosis not present

## 2023-02-06 DIAGNOSIS — M2021 Hallux rigidus, right foot: Secondary | ICD-10-CM | POA: Diagnosis not present

## 2023-02-06 DIAGNOSIS — M79675 Pain in left toe(s): Secondary | ICD-10-CM | POA: Diagnosis not present

## 2023-02-06 DIAGNOSIS — M79674 Pain in right toe(s): Secondary | ICD-10-CM | POA: Diagnosis not present

## 2023-02-12 DIAGNOSIS — G5603 Carpal tunnel syndrome, bilateral upper limbs: Secondary | ICD-10-CM | POA: Diagnosis not present

## 2023-03-04 ENCOUNTER — Ambulatory Visit (INDEPENDENT_AMBULATORY_CARE_PROVIDER_SITE_OTHER): Payer: 59

## 2023-03-04 VITALS — Ht 68.0 in | Wt 226.0 lb

## 2023-03-04 DIAGNOSIS — Z23 Encounter for immunization: Secondary | ICD-10-CM

## 2023-04-08 ENCOUNTER — Telehealth: Payer: Self-pay

## 2023-04-08 NOTE — Telephone Encounter (Signed)
Copied from CRM [id number]. Topic: Appointments - Scheduling Inquiry for Clinic >> Apr 08, 2023  2:05 PM Mosetta Putt H wrote: Reason for CRM: Needs full panel blood work but wants to know if insurance covers it, need to also know if he needs to do medicare visit

## 2023-04-15 ENCOUNTER — Other Ambulatory Visit: Payer: Self-pay

## 2023-04-15 ENCOUNTER — Telehealth: Payer: Self-pay

## 2023-04-15 DIAGNOSIS — J4489 Other specified chronic obstructive pulmonary disease: Secondary | ICD-10-CM

## 2023-04-15 DIAGNOSIS — J209 Acute bronchitis, unspecified: Secondary | ICD-10-CM

## 2023-04-15 MED ORDER — SYMBICORT 160-4.5 MCG/ACT IN AERO: 2.0000 | INHALATION_SPRAY | Freq: Two times a day (BID) | RESPIRATORY_TRACT | 0 refills | Status: DC

## 2023-04-15 NOTE — Telephone Encounter (Signed)
Prescription Request  04/15/2023  LOV: 01/13/23  What is the name of the medication or equipment? SYMBICORT 160-4.5 MCG/ACT inhaler [[id number]]   Have you contacted your pharmacy to request a refill? Yes   Which pharmacy would you like this sent to?  [id number] [place], [place] - [address] [place] [address] Phone: [phone] Fax: [phone]    Patient notified that their request is being sent to the clinical staff for review and that they should receive a response within 2 business days.   Please advise at [facility] [phone]

## 2023-04-17 ENCOUNTER — Other Ambulatory Visit: Payer: Self-pay | Admitting: Family Medicine

## 2023-04-17 DIAGNOSIS — J209 Acute bronchitis, unspecified: Secondary | ICD-10-CM

## 2023-04-17 NOTE — Telephone Encounter (Signed)
Requested Prescriptions  Pending Prescriptions Disp Refills   albuterol (VENTOLIN HFA) 108 (90 Base) MCG/ACT inhaler [Pharmacy Med Name: ALBUTEROL HFA (PROAIR) INHALER] 8.5 each 2    Sig: INHALE 2 PUFFS INTO THE LUNGS UP TO EVERY 6HRS AS NEEDED FOR WHEEZING/SHORTNESS OF BREATH     Pulmonology:  Beta Agonists 2 Failed - 04/17/2023  9:13 AM      Failed - Valid encounter within last 12 months    Recent Outpatient Visits           1 year ago COPD with acute exacerbation (HCC)   [facility] Pickard, Priscille Heidelberg, MD   2 years ago General medical exam   [facility] Donita Brooks, MD   2 years ago COPD with acute exacerbation [facility])   [facility] Donita Brooks, MD   2 years ago Orthostatic hypotension   [facility] Tanya Nones, Priscille Heidelberg, MD   2 years ago Orthostatic hypotension   [facility] Cathlean Marseilles A, NP              Passed - Last BP in normal range    BP Readings from Last 1 Encounters:  01/13/23 128/74         Passed - Last Heart Rate in normal range    Pulse Readings from Last 1 Encounters:  01/13/23 70

## 2023-04-24 ENCOUNTER — Ambulatory Visit: Payer: 59 | Admitting: Family Medicine

## 2023-05-22 DIAGNOSIS — M5030 Other cervical disc degeneration, unspecified cervical region: Secondary | ICD-10-CM | POA: Diagnosis not present

## 2023-05-22 DIAGNOSIS — G894 Chronic pain syndrome: Secondary | ICD-10-CM | POA: Diagnosis not present

## 2023-05-22 DIAGNOSIS — Z79899 Other long term (current) drug therapy: Secondary | ICD-10-CM | POA: Diagnosis not present

## 2023-05-22 DIAGNOSIS — Z5181 Encounter for therapeutic drug level monitoring: Secondary | ICD-10-CM | POA: Diagnosis not present

## 2023-05-22 DIAGNOSIS — M7918 Myalgia, other site: Secondary | ICD-10-CM | POA: Diagnosis not present

## 2023-05-22 DIAGNOSIS — M51362 Other intervertebral disc degeneration, lumbar region with discogenic back pain and lower extremity pain: Secondary | ICD-10-CM | POA: Diagnosis not present

## 2023-06-02 ENCOUNTER — Other Ambulatory Visit: Payer: Self-pay | Admitting: Family Medicine

## 2023-06-02 DIAGNOSIS — I10 Essential (primary) hypertension: Secondary | ICD-10-CM

## 2023-06-15 ENCOUNTER — Other Ambulatory Visit: Payer: Self-pay | Admitting: Family Medicine

## 2023-06-15 DIAGNOSIS — J209 Acute bronchitis, unspecified: Secondary | ICD-10-CM

## 2023-06-15 DIAGNOSIS — J439 Emphysema, unspecified: Secondary | ICD-10-CM

## 2023-06-17 ENCOUNTER — Encounter: Payer: 59 | Admitting: Family Medicine

## 2023-06-20 ENCOUNTER — Other Ambulatory Visit: Payer: Self-pay

## 2023-06-20 ENCOUNTER — Telehealth: Payer: Self-pay | Admitting: Family Medicine

## 2023-06-20 DIAGNOSIS — E781 Pure hyperglyceridemia: Secondary | ICD-10-CM

## 2023-06-20 MED ORDER — FENOFIBRATE 145 MG PO TABS: 145.0000 mg | ORAL_TABLET | Freq: Every day | ORAL | 0 refills | Status: DC

## 2023-06-20 NOTE — Telephone Encounter (Signed)
 Prescription Request  06/20/2023  LOV: 01/13/2023  What is the name of the medication or equipment?   fenofibrate (TRICOR) 145 MG tablet   Have you contacted your pharmacy to request a refill? Yes   Which pharmacy would you like this sent to?  [id number] [place], [place] - [address] [place] [address] Phone: [phone] Fax: [phone]    Patient notified that their request is being sent to the clinical staff for review and that they should receive a response within 2 business days.   Please advise pharmacist.

## 2023-07-03 ENCOUNTER — Other Ambulatory Visit: Payer: Self-pay | Admitting: Family Medicine

## 2023-07-03 DIAGNOSIS — J209 Acute bronchitis, unspecified: Secondary | ICD-10-CM

## 2023-07-10 ENCOUNTER — Other Ambulatory Visit

## 2023-07-10 DIAGNOSIS — N182 Chronic kidney disease, stage 2 (mild): Secondary | ICD-10-CM | POA: Diagnosis not present

## 2023-07-10 DIAGNOSIS — I10 Essential (primary) hypertension: Secondary | ICD-10-CM

## 2023-07-10 DIAGNOSIS — E781 Pure hyperglyceridemia: Secondary | ICD-10-CM

## 2023-07-11 ENCOUNTER — Other Ambulatory Visit: Payer: 59

## 2023-07-11 LAB — CBC WITH DIFFERENTIAL/PLATELET
Absolute Lymphocytes: 2010 {cells}/uL (ref 850–3900)
Absolute Monocytes: 441 {cells}/uL (ref 200–950)
Basophils Absolute: 32 {cells}/uL (ref 0–200)
Basophils Relative: 0.5 %
Eosinophils Absolute: 113 {cells}/uL (ref 15–500)
Eosinophils Relative: 1.8 %
HCT: 36.7 % — ABNORMAL LOW (ref 38.5–50.0)
Hemoglobin: 12.4 g/dL — ABNORMAL LOW (ref 13.2–17.1)
MCH: 30.2 pg (ref 27.0–33.0)
MCHC: 33.8 g/dL (ref 32.0–36.0)
MCV: 89.5 fL (ref 80.0–100.0)
MPV: 11.2 fL (ref 7.5–12.5)
Monocytes Relative: 7 %
Neutro Abs: 3704 {cells}/uL (ref 1500–7800)
Neutrophils Relative %: 58.8 %
Platelets: 241 10*3/uL (ref 140–400)
RBC: 4.1 10*6/uL — ABNORMAL LOW (ref 4.20–5.80)
RDW: 12.5 % (ref 11.0–15.0)
Total Lymphocyte: 31.9 %
WBC: 6.3 10*3/uL (ref 3.8–10.8)

## 2023-07-11 LAB — COMPLETE METABOLIC PANEL WITH GFR
AG Ratio: 2 (calc) (ref 1.0–2.5)
ALT: 16 U/L (ref 9–46)
AST: 16 U/L (ref 10–40)
Albumin: 4.6 g/dL (ref 3.6–5.1)
Alkaline phosphatase (APISO): 44 U/L (ref 36–130)
BUN/Creatinine Ratio: 19 (calc) (ref 6–22)
BUN: 35 mg/dL — ABNORMAL HIGH (ref 7–25)
CO2: 30 mmol/L (ref 20–32)
Calcium: 9.9 mg/dL (ref 8.6–10.3)
Chloride: 100 mmol/L (ref 98–110)
Creat: 1.81 mg/dL — ABNORMAL HIGH (ref 0.60–1.29)
Globulin: 2.3 g/dL (ref 1.9–3.7)
Glucose, Bld: 102 mg/dL — ABNORMAL HIGH (ref 65–99)
Potassium: 4.7 mmol/L (ref 3.5–5.3)
Sodium: 136 mmol/L (ref 135–146)
Total Bilirubin: 0.4 mg/dL (ref 0.2–1.2)
Total Protein: 6.9 g/dL (ref 6.1–8.1)
eGFR: 48 mL/min/{1.73_m2} — ABNORMAL LOW (ref >=60)

## 2023-07-11 LAB — LIPID PANEL
Cholesterol: 154 mg/dL (ref <200)
HDL: 41 mg/dL (ref >=40)
LDL Cholesterol (Calc): 92 mg/dL
Non-HDL Cholesterol (Calc): 113 mg/dL (ref <130)
Total CHOL/HDL Ratio: 3.8 (calc) (ref <5.0)
Triglycerides: 110 mg/dL (ref <150)

## 2023-07-15 ENCOUNTER — Encounter: Payer: Self-pay | Admitting: Family Medicine

## 2023-07-15 ENCOUNTER — Ambulatory Visit (INDEPENDENT_AMBULATORY_CARE_PROVIDER_SITE_OTHER): Payer: 59 | Admitting: Family Medicine

## 2023-07-15 VITALS — BP 126/70 | HR 63 | Ht 68.0 in | Wt 241.2 lb

## 2023-07-15 DIAGNOSIS — I10 Essential (primary) hypertension: Secondary | ICD-10-CM

## 2023-07-15 DIAGNOSIS — R7989 Other specified abnormal findings of blood chemistry: Secondary | ICD-10-CM | POA: Diagnosis not present

## 2023-07-15 DIAGNOSIS — Z0001 Encounter for general adult medical examination with abnormal findings: Secondary | ICD-10-CM

## 2023-07-15 DIAGNOSIS — N1831 Chronic kidney disease, stage 3a: Secondary | ICD-10-CM | POA: Diagnosis not present

## 2023-07-15 DIAGNOSIS — E781 Pure hyperglyceridemia: Secondary | ICD-10-CM | POA: Diagnosis not present

## 2023-07-15 DIAGNOSIS — Z0000 Encounter for general adult medical examination without abnormal findings: Secondary | ICD-10-CM

## 2023-07-15 MED ORDER — OMEGA-3-ACID ETHYL ESTERS 1 G PO CAPS: 1.0000 g | ORAL_CAPSULE | Freq: Two times a day (BID) | ORAL | 3 refills | Status: AC

## 2023-07-15 NOTE — Progress Notes (Signed)
 Subjective:    Patient ID: Tim Post., male    DOB: 1980/09/10, 43 y.o.   MRN: [id number]  Patient is a 43 year old Caucasian gentleman with a history of hypertension, borderline stage III chronic kidney disease, tobacco abuse, dyslipidemia who presents today for complete physical exam.  Had coronary artery CT scan in 2024 which revealed no evidence of CAD.  Recently on his lab work, there has been a decline in his GFR from 83-48.  Around the same time, the patient admits he has been using Advil for joint pain.  Tetanus shot is up-to-date.  Flu shot is up-to-date.  He is not yet due for colon cancer screening.  Unfortunately continues to smoke.  He is not yet due for prostate cancer screening. Lab on 07/10/2023  Component Date Value Ref Range Status   Glucose, Bld 07/10/2023 102 (H)  65 - 99 mg/dL Final   Comment: .            Fasting reference interval . For someone without known diabetes, a glucose value between 100 and 125 mg/dL is consistent with prediabetes and should be confirmed with a follow-up test. .    BUN 07/10/2023 35 (H)  7 - 25 mg/dL Final   Creat 40/98/1191 1.81 (H)  0.60 - 1.29 mg/dL Final   eGFR 47/82/9562 48 (L)  > OR = 60 mL/min/1.67m2 Final   BUN/Creatinine Ratio 07/10/2023 19  6 - 22 (calc) Final   Sodium 07/10/2023 136  135 - 146 mmol/L Final   Potassium 07/10/2023 4.7  3.5 - 5.3 mmol/L Final   Chloride 07/10/2023 100  98 - 110 mmol/L Final   CO2 07/10/2023 30  20 - 32 mmol/L Final   Calcium 07/10/2023 9.9  8.6 - 10.3 mg/dL Final   Total Protein 13/11/6576 6.9  6.1 - 8.1 g/dL Final   Albumin 46/96/2952 4.6  3.6 - 5.1 g/dL Final   Globulin 84/13/2440 2.3  1.9 - 3.7 g/dL (calc) Final   AG Ratio 07/10/2023 2.0  1.0 - 2.5 (calc) Final   Total Bilirubin 07/10/2023 0.4  0.2 - 1.2 mg/dL Final   Alkaline phosphatase (APISO) 07/10/2023 44  36 - 130 U/L Final   AST 07/10/2023 16  10 - 40 U/L Final   ALT 07/10/2023 16  9 - 46 U/L Final   WBC 07/10/2023 6.3  3.8  - 10.8 Thousand/uL Final   RBC 07/10/2023 4.10 (L)  4.20 - 5.80 Million/uL Final   Hemoglobin 07/10/2023 12.4 (L)  13.2 - 17.1 g/dL Final   HCT 02/23/2535 36.7 (L)  38.5 - 50.0 % Final   MCV 07/10/2023 89.5  80.0 - 100.0 fL Final   MCH 07/10/2023 30.2  27.0 - 33.0 pg Final   MCHC 07/10/2023 33.8  32.0 - 36.0 g/dL Final   Comment: For adults, a slight decrease in the calculated MCHC value (in the range of 30 to 32 g/dL) is most likely not clinically significant; however, it should be interpreted with caution in correlation with other red cell parameters and the patient's clinical condition.    RDW 07/10/2023 12.5  11.0 - 15.0 % Final   Platelets 07/10/2023 241  140 - 400 Thousand/uL Final   MPV 07/10/2023 11.2  7.5 - 12.5 fL Final   Neutro Abs 07/10/2023 3,704  1,500 - 7,800 cells/uL Final   Absolute Lymphocytes 07/10/2023 2,010  850 - 3,900 cells/uL Final   Absolute Monocytes 07/10/2023 441  200 - 950 cells/uL Final   Eosinophils Absolute 07/10/2023  113  15 - 500 cells/uL Final   Basophils Absolute 07/10/2023 32  0 - 200 cells/uL Final   Neutrophils Relative % 07/10/2023 58.8  % Final   Total Lymphocyte 07/10/2023 31.9  % Final   Monocytes Relative 07/10/2023 7.0  % Final   Eosinophils Relative 07/10/2023 1.8  % Final   Basophils Relative 07/10/2023 0.5  % Final   Cholesterol 07/10/2023 154  <200 mg/dL Final   HDL 16/01/9603 41  > OR = 40 mg/dL Final   Triglycerides 54/12/8117 110  <150 mg/dL Final   LDL Cholesterol (Calc) 07/10/2023 92  mg/dL (calc) Final   Comment: Reference range: <100 . Desirable range <100 mg/dL for primary prevention;   <70 mg/dL for patients with CHD or diabetic patients  with > or = 2 CHD risk factors. Marland Kitchen LDL-C is now calculated using the Martin-Hopkins  calculation, which is a validated novel method providing  better accuracy than the Friedewald equation in the  estimation of LDL-C.  Horald Pollen et al. Lenox Ahr. 1478;295(62): 2061-2068   (http://education.[facility].com/faq/FAQ164)    Total CHOL/HDL Ratio 07/10/2023 3.8  <1.3 (calc) Final   Non-HDL Cholesterol (Calc) 07/10/2023 113  <130 mg/dL (calc) Final   Comment: For patients with diabetes plus 1 major ASCVD risk  factor, treating to a non-HDL-C goal of <100 mg/dL  (LDL-C of <08 mg/dL) is considered a therapeutic  option.     Past Medical History:  Diagnosis Date   Avascular necrosis of bone of right hip (HCC)    Cervicalgia    COPD (chronic obstructive pulmonary disease) (HCC)    GAD (generalized anxiety disorder)    Hypertension    Hypertriglyceridemia    Lumbago    Lumbosacral spondylosis without myelopathy    Myalgia and myositis, unspecified    Other symptoms referable to back    PSA (psoriatic arthritis) (HCC)    Smoker    Thoracic spondylosis without myelopathy    Past Surgical History:  Procedure Laterality Date   DISTAL CLAVICLE EXCISION Right 2017   ROTATOR CUFF REPAIR  2010   right   TONSILLECTOMY AND ADENOIDECTOMY  Age 36   Current Outpatient Medications on File Prior to Visit  Medication Sig Dispense Refill   albuterol (VENTOLIN HFA) 108 (90 Base) MCG/ACT inhaler INHALE 2 PUFFS INTO THE LUNGS UP TO EVERY 6HRS AS NEEDED FOR WHEEZING/SHORTNESS OF BREATH 8.5 each 2   buPROPion (WELLBUTRIN XL) 150 MG 24 hr tablet Take 1 tablet (150 mg total) by mouth daily. 90 tablet 1   fenofibrate (TRICOR) 145 MG tablet Take 1 tablet (145 mg total) by mouth daily. 90 tablet 0   fluvoxaMINE (LUVOX) 50 MG tablet Take 75 mg by mouth at bedtime. Take 50mg  and 25mg  daily (75Mg )     lisinopril (ZESTRIL) 10 MG tablet TAKE 1 TABLET BY MOUTH EVERY DAY 90 tablet 1   naloxone (NARCAN) nasal spray 4 mg/0.1 mL Place 0.4 mg into the nose once.     nitroGLYCERIN (NITROSTAT) 0.4 MG SL tablet Place 1 tablet (0.4 mg total) under the tongue every 5 (five) minutes as needed for chest pain. 25 tablet 3   omeprazole (PRILOSEC) 20 MG capsule Take 1 capsule (20 mg total) by  mouth daily. 30 capsule 10   Oxycodone HCl 10 MG TABS Take 10 mg by mouth 4 (four) times daily.      pregabalin (LYRICA) 75 MG capsule Take 75 mg by mouth 2 (two) times daily.     QUEtiapine (SEROQUEL) 400 MG tablet  Take 400 mg by mouth at bedtime.     SYMBICORT 160-4.5 MCG/ACT inhaler INHALE 2 PUFFS INTO THE LUNGS TWICE A DAY 20.4 each 0   tiZANidine (ZANAFLEX) 4 MG tablet Take 6 mg by mouth 3 (three) times daily. Take 1-1/2 tabs 3x daily     traZODone (DESYREL) 100 MG tablet Take 100 mg by mouth at bedtime.     No current facility-administered medications on file prior to visit.   Allergies  Allergen Reactions   Ceclor [Cefaclor]    Sulfa Antibiotics    Misc. Sulfonamide Containing Compounds Rash   Social History   Socioeconomic History   Marital status: Single    Spouse name: Not on file   Number of children: Not on file   Years of education: Not on file   Highest education level: Some college, no degree  Occupational History   Not on file  Tobacco Use   Smoking status: Every Day    Current packs/day: 1.50    Average packs/day: 1.5 packs/day for 26.2 years (39.3 ttl pk-yrs)    Types: Cigarettes    Start date: 04/29/1997   Smokeless tobacco: Never  Substance and Sexual Activity   Alcohol use: Yes    Alcohol/week: 0.0 standard drinks of alcohol    Comment: occasional once a month   Drug use: No   Sexual activity: Not Currently  Other Topics Concern   Not on file  Social History Narrative   05/21/2012 AHW [name] was born and grew up in [place], [place]. He reports he had a good childhood. He has one sister who is 3 years younger than he. His parents are still married. He graduated from high school and attended [employer] studying information systems for 2 years but did not turn a degree. In high school he was a member of the [employer]. 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 He shares custody with his son's mother, but the patient keeps his son most of the time. He denies any current legal issues. He reports that his social support system consists of his mother, father, and sister. He currently lives with his family. 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 He reports that he believes in God but he does not practice any religion. 05/21/2012 AHW   Social Drivers of Health   Financial Resource Strain: High Risk (07/11/2023)   Overall Financial Resource Strain (CARDIA)    Difficulty of Paying Living Expenses: Very hard  Food Insecurity: Food Insecurity Present (07/11/2023)   Hunger Vital Sign    Worried About Running Out of Food in the Last Year: Often true    Ran Out of Food in the Last Year: Often true  Transportation Needs: No Transportation Needs (07/11/2023)   PRAPARE - Administrator, Civil Service (Medical): No    Lack of Transportation (Non-Medical): No  Physical Activity: Unknown (07/11/2023)   Exercise Vital Sign    Days of Exercise per Week: Patient declined    Minutes of Exercise per Session: Not on file  Stress: Stress Concern Present (07/11/2023)   [employer] of Occupational Health - Occupational Stress Questionnaire    Feeling of Stress : To some extent  Social Connections: Unknown (07/11/2023)   Social Connection and Isolation Panel [NHANES]    Frequency of Communication with Friends and Family: Patient declined    Frequency of  Social Gatherings with Friends and Family: Patient declined    Attends Religious Services: Patient declined    Database administrator or Organizations: Patient declined    Attends Engineer, structural: Not on file    Marital Status: Never married  Intimate Partner Violence: Not on file    Review of Systems  All other systems reviewed and are negative.      Objective:   Physical Exam Vitals reviewed.  Constitutional:      General: He is not  in acute distress.    Appearance: Normal appearance. He is obese. He is not ill-appearing, toxic-appearing or diaphoretic.  HENT:     Head: Normocephalic and atraumatic.     Right Ear: Tympanic membrane and ear canal normal. There is no impacted cerumen.     Left Ear: Tympanic membrane and ear canal normal. There is no impacted cerumen.     Nose: Nose normal. No congestion or rhinorrhea.     Mouth/Throat:     Mouth: Mucous membranes are moist.     Pharynx: Oropharynx is clear. No oropharyngeal exudate or posterior oropharyngeal erythema.  Eyes:     General: No scleral icterus.       Right eye: No discharge.        Left eye: No discharge.     Extraocular Movements: Extraocular movements intact.     Conjunctiva/sclera: Conjunctivae normal.     Pupils: Pupils are equal, round, and reactive to light.  Neck:     Vascular: No carotid bruit.  Cardiovascular:     Rate and Rhythm: Normal rate and regular rhythm.     Heart sounds: Normal heart sounds. No murmur heard.    No friction rub. No gallop.  Pulmonary:     Effort: Pulmonary effort is normal. No respiratory distress.     Breath sounds: Normal breath sounds. No stridor. No wheezing, rhonchi or rales.  Abdominal:     General: Abdomen is flat. Bowel sounds are normal. There is no distension.     Palpations: There is no mass.     Tenderness: There is no abdominal tenderness. There is no right CVA tenderness, left CVA tenderness, guarding or rebound.     Hernia: No hernia is present.  Musculoskeletal:     Cervical back: Normal range of motion. No rigidity or tenderness.     Right lower leg: No edema.     Left lower leg: No edema.  Lymphadenopathy:     Cervical: No cervical adenopathy.  Skin:    General: Skin is warm.     Coloration: Skin is not jaundiced.     Findings: No bruising, erythema, lesion or rash.  Neurological:     General: No focal deficit present.     Mental Status: He is alert and oriented to person, place, and time.  Mental status is at baseline.     Cranial Nerves: No cranial nerve deficit.     Sensory: No sensory deficit.     Motor: No weakness.     Coordination: Coordination normal.     Gait: Gait normal.     Deep Tendon Reflexes: Reflexes normal.  Psychiatric:        Mood and Affect: Mood normal.        Behavior: Behavior normal.        Thought Content: Thought content normal.        Judgment: Judgment normal.           Assessment & Plan:  General medical exam  Benign essential HTN  Hypertriglyceridemia  Stage 3a chronic kidney disease (HCC) - Plan: US Renal, Protein / Creatinine Ratio, Urine  Due to declining renal function, discontinue fenofibrate.  In 2 weeks recheck BMP.  I have asked the patient to discontinue all NSAIDs as I believe that this was the driving force behind the decline in his GFR.  If not improving at that time, I would repeat a renal ultrasound and also get an ultrasound to evaluate for renal artery stenosis.  Strongly recommend smoking cessation.  Blood pressure is acceptable cholesterol is excellent.  Replace fenofibrate with Lovaza 1 g p.o. twice daily

## 2023-08-12 ENCOUNTER — Telehealth: Payer: Self-pay

## 2023-08-12 NOTE — Telephone Encounter (Signed)
 Copied from CRM [id number]. Topic: Clinical - Request for Lab/Test Order >> Aug 12, 2023  1:19 PM Tim [name] S wrote: Reason for CRM: Patient called in needing to schedule appt for blood test labs to be redone, as per Dr. Cheril Cork

## 2023-08-14 ENCOUNTER — Ambulatory Visit (INDEPENDENT_AMBULATORY_CARE_PROVIDER_SITE_OTHER): Payer: 59 | Admitting: *Deleted

## 2023-08-14 DIAGNOSIS — Z0000 Encounter for general adult medical examination without abnormal findings: Secondary | ICD-10-CM | POA: Diagnosis not present

## 2023-08-14 NOTE — Progress Notes (Signed)
 Subjective:   Tim [name]. is a 43 y.o. male who presents for Medicare Annual/Subsequent preventive examination.  Visit Complete: Virtual I connected with  Tim [name]. on 08/14/23 by a audio enabled telemedicine application and verified that I am speaking with the correct person using two identifiers.  Patient Location: Home  Provider Location: Home Office  I discussed the limitations of evaluation and management by telemedicine. The patient expressed understanding and agreed to proceed.  Vital Signs: Because this visit was a virtual/telehealth visit, some criteria may be missing or patient reported. Any vitals not documented were not able to be obtained and vitals that have been documented are patient reported.      Objective:    Today's Vitals   08/14/23 1104  PainSc: 7    There is no height or weight on file to calculate BMI.     08/14/2023   11:23 AM  Advanced Directives  Does Patient Have a Medical Advance Directive? No  Would patient like information on creating a medical advance directive? No - Patient declined    Current Medications (verified) Outpatient Encounter Medications as of 08/14/2023  Medication Sig   albuterol (VENTOLIN HFA) 108 (90 Base) MCG/ACT inhaler INHALE 2 PUFFS INTO THE LUNGS UP TO EVERY 6HRS AS NEEDED FOR WHEEZING/SHORTNESS OF BREATH   buPROPion (WELLBUTRIN XL) 150 MG 24 hr tablet Take 1 tablet (150 mg total) by mouth daily.   fluvoxaMINE (LUVOX) 50 MG tablet Take 100 mg by mouth at bedtime. Take 50mg  in 50 mg at night per pt-07/15/2023.   lisinopril (ZESTRIL) 10 MG tablet TAKE 1 TABLET BY MOUTH EVERY DAY   naloxone (NARCAN) nasal spray 4 mg/0.1 mL Place 0.4 mg into the nose once.   nitroGLYCERIN (NITROSTAT) 0.4 MG SL tablet Place 1 tablet (0.4 mg total) under the tongue every 5 (five) minutes as needed for chest pain.   omega-3 acid ethyl esters (LOVAZA) 1 g capsule Take 1 capsule (1 g total) by mouth 2 (two) times daily.   Omega-3 Fatty  Acids (FISH OIL) 1000 MG CAPS Take 1,000 each by mouth. 2 times daily    one in morning   and one in evening   omeprazole (PRILOSEC) 20 MG capsule Take 1 capsule (20 mg total) by mouth daily.   Oxycodone HCl 10 MG TABS Take 10 mg by mouth 4 (four) times daily.    pregabalin (LYRICA) 75 MG capsule Take 75 mg by mouth 2 (two) times daily.   QUEtiapine (SEROQUEL) 400 MG tablet Take 400 mg by mouth at bedtime.   SYMBICORT 160-4.5 MCG/ACT inhaler INHALE 2 PUFFS INTO THE LUNGS TWICE A DAY   tiZANidine (ZANAFLEX) 4 MG tablet Take 6 mg by mouth 3 (three) times daily. Take 1-1/2 tabs 3x daily   traZODone (DESYREL) 100 MG tablet Take 100 mg by mouth at bedtime.   No facility-administered encounter medications on file as of 08/14/2023.    Allergies (verified) Ceclor [cefaclor], Sulfa antibiotics, and Misc. sulfonamide containing compounds   History: Past Medical History:  Diagnosis Date   Avascular necrosis of bone of right hip (HCC)    Cervicalgia    COPD (chronic obstructive pulmonary disease) (HCC)    GAD (generalized anxiety disorder)    Hypertension    Hypertriglyceridemia    Lumbago    Lumbosacral spondylosis without myelopathy    Myalgia and myositis, unspecified    Other symptoms referable to back    PSA (psoriatic arthritis) (HCC)    Smoker  Thoracic spondylosis without myelopathy    Past Surgical History:  Procedure Laterality Date   DISTAL CLAVICLE EXCISION Right 2017   ROTATOR CUFF REPAIR  2010   right   TONSILLECTOMY AND ADENOIDECTOMY  Age 59   Family History  Problem Relation Age of Onset   Hypertension Mother    Hypertension Father    Diabetes Father    COPD Father    Cancer Paternal Uncle    Cancer Maternal Grandmother    Cancer Paternal Grandmother    ADD / ADHD Son    Social History   Socioeconomic History   Marital status: Single    Spouse name: Not on file   Number of children: Not on file   Years of education: Not on file   Highest education level:  Some college, no degree  Occupational History   Not on file  Tobacco Use   Smoking status: Every Day    Current packs/day: 1.50    Average packs/day: 1.5 packs/day for 26.3 years (39.4 ttl pk-yrs)    Types: Cigarettes    Start date: 04/29/1997   Smokeless tobacco: Never  Substance and Sexual Activity   Alcohol use: Yes    Alcohol/week: 0.0 standard drinks of alcohol    Comment: occasional once a month   Drug use: No   Sexual activity: Not Currently  Other Topics Concern   Not on file  Social History Narrative   05/21/2012 AHW Athena Bland was born and grew up in [place]  . He reports he had a good childhood. He has one sister who is 3 years younger than he. His parents are still married. He graduated from high school and attended [employer] community college studying information systems for 2 years but did not turn a degree. In high school he was a member of the [employer]. 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 He has never been married he has one son who is currently 64 years old. He shares custody with his son's mother, but the patient keeps his son most of the time. He denies any current legal issues. He reports that his social support system consists of his mother, father, and sister. He currently lives with his family. 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 He reports that he believes in God but he does not practice any religion. 05/21/2012 AHW   Social Drivers of Health   Financial Resource Strain: High Risk (08/14/2023)   Overall Financial Resource Strain (CARDIA)    Difficulty of Paying Living Expenses: Very hard  Food Insecurity: Food Insecurity Present (08/14/2023)   Hunger Vital Sign    Worried About Running Out of Food in the Last Year: Often true    Ran Out of Food in the Last Year: Often true  Transportation Needs: No Transportation Needs (08/14/2023)   PRAPARE - Administrator, Civil Service (Medical):  No    Lack of Transportation (Non-Medical): No  Physical Activity: Unknown (08/14/2023)   Exercise Vital Sign    Days of Exercise per Week: Patient declined    Minutes of Exercise per Session: 0 min  Stress: Stress Concern Present (08/14/2023)   [employer] of Occupational Health - Occupational Stress Questionnaire    Feeling of Stress : To some extent  Social Connections: Unknown (08/14/2023)   Social Connection and Isolation Panel [NHANES]    Frequency of Communication with Friends and Family: Patient declined    Frequency of Social Gatherings with Friends and Family:  Patient declined    Attends Religious Services: Patient declined    Active Member of Clubs or Organizations: Patient declined    Attends Banker Meetings: Patient declined    Marital Status: Never married    Tobacco Counseling Ready to quit: Not Answered Counseling given: Not Answered   Clinical Intake:  Pre-visit preparation completed: Yes  Pain : 0-10 Pain Score: 7  Pain Type: Chronic pain Pain Location: Back Pain Descriptors / Indicators: Burning, Aching, Constant, Dull Pain Onset: In the past 7 days Pain Frequency: Intermittent Pain Relieving Factors: tylenol , oxycodone  Pain Relieving Factors: tylenol , oxycodone  Diabetes: No  How often do you need to have someone help you when you read instructions, pamphlets, or other written materials from your doctor or pharmacy?: 1 - Never  Interpreter Needed?: No  Information entered by :: Remi Haggard LPN   Activities of Daily Living    08/14/2023   11:07 AM 08/14/2023    8:05 AM  In your present state of health, do you have any difficulty performing the following activities:  Hearing? 0 0  Vision? 0 0  Difficulty concentrating or making decisions? 0 0  Walking or climbing stairs? 1 1  Dressing or bathing? 0 0  Doing errands, shopping? 1 1  Preparing Food and eating ? N N  Using the Toilet? N N  In the past six months, have you  accidently leaked urine? Y Y  Do you have problems with loss of bowel control? N N  Managing your Medications? N N  Managing your Finances? N N  Housekeeping or managing your Housekeeping? Malvin Johns    Patient Care Team: Donita Brooks, MD as PCP - General (Family Medicine) Christell Constant, MD as PCP - Cardiology (Cardiology) Erroll Luna, [facility] (Inactive) as Pharmacist (Pharmacist)  Indicate any recent Medical Services you may have received from other than [facility] providers in the past year (date may be approximate).     Assessment:   This is a routine wellness examination for Kayin.  Hearing/Vision screen Hearing Screening - Comments:: High frequency tone deaf  Does not wear hearing aids Vision Screening - Comments:: Not up to date Education provided   Goals Addressed             This Visit's Progress    Patient Stated       Keep movement I have  Loose some weight       Depression Screen    08/14/2023   11:10 AM 04/11/2022   10:36 AM 05/30/2020   11:22 AM 11/24/2018   10:34 AM 06/02/2017    3:12 PM  PHQ 2/9 Scores  PHQ - 2 Score 3 0 0 4 6  PHQ- 9 Score 12   12 13     Fall Risk    08/14/2023   11:05 AM 08/14/2023    8:05 AM 04/11/2022   10:36 AM 05/30/2020   11:22 AM  Fall Risk   Falls in the past year? 0 0 0 0  Number falls in past yr: 0  0 0  Injury with Fall? 0  0 0  Risk for fall due to :   No Fall Risks   Follow up Falls evaluation completed;Education provided;Falls prevention discussed  Falls prevention discussed     MEDICARE RISK AT HOME: Medicare Risk at Home Any stairs in or around the home?: Yes If so, are there any without handrails?: No Home free of loose throw rugs in walkways,  pet beds, electrical cords, etc?: Yes Adequate lighting in your home to reduce risk of falls?: Yes Life alert?: No Use of a cane, walker or w/c?: No Grab bars in the bathroom?: Yes Shower chair or bench in shower?: No Elevated toilet seat or a handicapped  toilet?: No  TIMED UP AND GO:  Was the test performed?  No    Cognitive Function:        08/14/2023   11:08 AM  6CIT Screen  What Year? 0 points  What month? 0 points  What time? 0 points  Count back from 20 0 points  Months in reverse 0 points  Repeat phrase 0 points  Total Score 0 points    Immunizations Immunization History  Administered Date(s) Administered   Influenza, Seasonal, Injecte, Preservative Fre 03/04/2023   Influenza,inj,Quad PF,6+ Mos 05/10/2014, 03/05/2018, 02/22/2019, 02/29/2020, 03/07/2021, 04/04/2022   PFIZER(Purple Top)SARS-COV-2 Vaccination 12/29/2019   Tdap 04/10/2021    TDAP status: Up to date  Flu Vaccine status: Up to date  Pneumococcal vaccine status: Due, Education has been provided regarding the importance of this vaccine. Advised may receive this vaccine at local pharmacy or Health Dept. Aware to provide a copy of the vaccination record if obtained from local pharmacy or Health Dept. Verbalized acceptance and understanding.  Covid-19 vaccine status: Information provided on how to obtain vaccines.   Qualifies for Shingles Vaccine? No   Zostavax completed No     Screening Tests Health Maintenance  Topic Date Due   HIV Screening  Never done   Pneumococcal Vaccine 86-50 Years old (1 of 2 - PCV) Never done   COVID-19 Vaccine (2 - Pfizer risk series) 01/19/2020   INFLUENZA VACCINE  11/28/2023   Medicare Annual Wellness (AWV)  08/13/2024   DTaP/Tdap/Td (2 - Td or Tdap) 04/11/2031   Hepatitis C Screening  Completed   HPV VACCINES  Aged Out   Meningococcal B Vaccine  Aged Out    Health Maintenance  Health Maintenance Due  Topic Date Due   HIV Screening  Never done   Pneumococcal Vaccine 20-52 Years old (1 of 2 - PCV) Never done   COVID-19 Vaccine (2 - Pfizer risk series) 01/19/2020     Lung Cancer Screening: (Low Dose CT Chest recommended if Age 42-80 years, 20 pack-year currently smoking OR have quit w/in 15years.) does not  qualify.   Lung Cancer Screening Referral:   Additional Screening:  Hepatitis C Screening: does not qualify; Completed 2015  Vision Screening: Recommended annual ophthalmology exams for early detection of glaucoma and other disorders of the eye. Is the patient up to date with their annual eye exam?  No  Who is the provider or what is the name of the office in which the patient attends annual eye exams?  If pt is not established with a provider, would they like to be referred to a provider to establish care? No .   Dental Screening: Recommended annual dental exams for proper oral hygiene    Community Resource Referral / Chronic Care Management: CRR required this visit?  No   CCM required this visit?  No     Plan:     I have personally reviewed and noted the following in the patient's chart:   Medical and social history Use of alcohol, tobacco or illicit drugs  Current medications and supplements including opioid prescriptions. Patient is currently taking opioid prescriptions. Information provided to patient regarding non-opioid alternatives. Patient advised to discuss non-opioid treatment plan with their provider.  Functional ability and status Nutritional status Physical activity Advanced directives List of other physicians Hospitalizations, surgeries, and ER visits in previous 12 months Vitals Screenings to include cognitive, depression, and falls Referrals and appointments  In addition, I have reviewed and discussed with patient certain preventive protocols, quality metrics, and best practice recommendations. A written personalized care plan for preventive services as well as general preventive health recommendations were provided to patient.     Kieth Pelt, LPN   12/25/9369   After Visit Summary: (MyChart) Due to this being a telephonic visit, the after visit summary with patients personalized plan was offered to patient via MyChart   Nurse Notes:

## 2023-08-14 NOTE — Patient Instructions (Signed)
 Mr. Tim [name] , Thank you for taking time to come for your Medicare Wellness Visit. I appreciate your ongoing commitment to your health goals. Please review the following plan we discussed and let me know if I can assist you in the future.   Screening recommendations/referrals:  Recommended yearly ophthalmology/optometry visit for glaucoma screening and checkup Recommended yearly dental visit for hygiene and checkup  Vaccinations: Influenza vaccine: up to date Pneumococcal vaccine: Education provided Tdap vaccine: up to date     Preventive Care 40-64 Years, Male Preventive care refers to lifestyle choices and visits with your health care provider that can promote health and wellness. What does preventive care include? A yearly physical exam. This is also called an annual well check. Dental exams once or twice a year. Routine eye exams. Ask your health care provider how often you should have your eyes checked. Personal lifestyle choices, including: Daily care of your teeth and gums. Regular physical activity. Eating a healthy diet. Avoiding tobacco and drug use. Limiting alcohol use. Practicing safe sex. Taking low-dose aspirin every day starting at age 73. What happens during an annual well check? The services and screenings done by your health care provider during your annual well check will depend on your age, overall health, lifestyle risk factors, and family history of disease. Counseling  Your health care provider may ask you questions about your: Alcohol use. Tobacco use. Drug use. Emotional well-being. Home and relationship well-being. Sexual activity. Eating habits. Work and work Astronomer. Screening  You may have the following tests or measurements: Height, weight, and BMI. Blood pressure. Lipid and cholesterol levels. These may be checked every 5 years, or more frequently if you are over 27 years old. Skin check. Lung cancer screening. You may have this screening  every year starting at age 65 if you have a 30-pack-year history of smoking and currently smoke or have quit within the past 15 years. Fecal occult blood test (FOBT) of the stool. You may have this test every year starting at age 62. Flexible sigmoidoscopy or colonoscopy. You may have a sigmoidoscopy every 5 years or a colonoscopy every 10 years starting at age 55. Prostate cancer screening. Recommendations will vary depending on your family history and other risks. Hepatitis C blood test. Hepatitis B blood test. Sexually transmitted disease (STD) testing. Diabetes screening. This is done by checking your blood sugar (glucose) after you have not eaten for a while (fasting). You may have this done every 1-3 years. Discuss your test results, treatment options, and if necessary, the need for more tests with your health care provider. Vaccines  Your health care provider may recommend certain vaccines, such as: Influenza vaccine. This is recommended every year. Tetanus, diphtheria, and acellular pertussis (Tdap, Td) vaccine. You may need a Td booster every 10 years. Zoster vaccine. You may need this after age 34. Pneumococcal 13-valent conjugate (PCV13) vaccine. You may need this if you have certain conditions and have not been vaccinated. Pneumococcal polysaccharide (PPSV23) vaccine. You may need one or two doses if you smoke cigarettes or if you have certain conditions. Talk to your health care provider about which screenings and vaccines you need and how often you need them. This information is not intended to replace advice given to you by your health care provider. Make sure you discuss any questions you have with your health care provider. Document Released: 05/12/2015 Document Revised: 01/03/2016 Document Reviewed: 02/14/2015 Elsevier Interactive Patient Education  2017 [employer].  Fall Prevention in the Home  Falls can cause injuries. They can happen to people of all ages. There are many  things you can do to make your home safe and to help prevent falls. What can I do on the outside of my home? Regularly fix the edges of walkways and driveways and fix any cracks. Remove anything that might make you trip as you walk through a door, such as a raised step or threshold. Trim any bushes or trees on the path to your home. Use bright outdoor lighting. Clear any walking paths of anything that might make someone trip, such as rocks or tools. Regularly check to see if handrails are loose or broken. Make sure that both sides of any steps have handrails. Any raised decks and porches should have guardrails on the edges. Have any leaves, snow, or ice cleared regularly. Use sand or salt on walking paths during winter. Clean up any spills in your garage right away. This includes oil or grease spills. What can I do in the bathroom? Use night lights. Install grab bars by the toilet and in the tub and shower. Do not use towel bars as grab bars. Use non-skid mats or decals in the tub or shower. If you need to sit down in the shower, use a plastic, non-slip stool. Keep the floor dry. Clean up any water that spills on the floor as soon as it happens. Remove soap buildup in the tub or shower regularly. Attach bath mats securely with double-sided non-slip rug tape. Do not have throw rugs and other things on the floor that can make you trip. What can I do in the bedroom? Use night lights. Make sure that you have a light by your bed that is easy to reach. Do not use any sheets or blankets that are too big for your bed. They should not hang down onto the floor. Have a firm chair that has side arms. You can use this for support while you get dressed. Do not have throw rugs and other things on the floor that can make you trip. What can I do in the kitchen? Clean up any spills right away. Avoid walking on wet floors. Keep items that you use a lot in easy-to-reach places. If you need to reach  something above you, use a strong step stool that has a grab bar. Keep electrical cords out of the way. Do not use floor polish or wax that makes floors slippery. If you must use wax, use non-skid floor wax. Do not have throw rugs and other things on the floor that can make you trip. What can I do with my stairs? Do not leave any items on the stairs. Make sure that there are handrails on both sides of the stairs and use them. Fix handrails that are broken or loose. Make sure that handrails are as long as the stairways. Check any carpeting to make sure that it is firmly attached to the stairs. Fix any carpet that is loose or worn. Avoid having throw rugs at the top or bottom of the stairs. If you do have throw rugs, attach them to the floor with carpet tape. Make sure that you have a light switch at the top of the stairs and the bottom of the stairs. If you do not have them, ask someone to add them for you. What else can I do to help prevent falls? Wear shoes that: Do not have high heels. Have rubber bottoms. Are comfortable and fit you well. Are closed at  the toe. Do not wear sandals. If you use a stepladder: Make sure that it is fully opened. Do not climb a closed stepladder. Make sure that both sides of the stepladder are locked into place. Ask someone to hold it for you, if possible. Clearly mark and make sure that you can see: Any grab bars or handrails. First and last steps. Where the edge of each step is. Use tools that help you move around (mobility aids) if they are needed. These include: Canes. Walkers. Scooters. Crutches. Turn on the lights when you go into a dark area. Replace any light bulbs as soon as they burn out. Set up your furniture so you have a clear path. Avoid moving your furniture around. If any of your floors are uneven, fix them. If there are any pets around you, be aware of where they are. Review your medicines with your doctor. Some medicines can make you  feel dizzy. This can increase your chance of falling. Ask your doctor what other things that you can do to help prevent falls. This information is not intended to replace advice given to you by your health care provider. Make sure you discuss any questions you have with your health care provider. Document Released: 02/09/2009 Document Revised: 09/21/2015 Document Reviewed: 05/20/2014 Elsevier Interactive Patient Education  2017 [employer].

## 2023-08-15 ENCOUNTER — Other Ambulatory Visit

## 2023-08-15 DIAGNOSIS — I10 Essential (primary) hypertension: Secondary | ICD-10-CM | POA: Diagnosis not present

## 2023-08-15 DIAGNOSIS — N1831 Chronic kidney disease, stage 3a: Secondary | ICD-10-CM

## 2023-08-16 ENCOUNTER — Other Ambulatory Visit: Payer: Self-pay | Admitting: Family Medicine

## 2023-08-16 DIAGNOSIS — J4489 Other specified chronic obstructive pulmonary disease: Secondary | ICD-10-CM

## 2023-08-16 DIAGNOSIS — J209 Acute bronchitis, unspecified: Secondary | ICD-10-CM

## 2023-08-16 LAB — COMPREHENSIVE METABOLIC PANEL WITH GFR
AG Ratio: 1.8 (calc) (ref 1.0–2.5)
ALT: 21 U/L (ref 9–46)
AST: 17 U/L (ref 10–40)
Albumin: 4.2 g/dL (ref 3.6–5.1)
Alkaline phosphatase (APISO): 78 U/L (ref 36–130)
BUN/Creatinine Ratio: 9 (calc) (ref 6–22)
BUN: 12 mg/dL (ref 7–25)
CO2: 28 mmol/L (ref 20–32)
Calcium: 9.4 mg/dL (ref 8.6–10.3)
Chloride: 103 mmol/L (ref 98–110)
Creat: 1.31 mg/dL — ABNORMAL HIGH (ref 0.60–1.29)
Globulin: 2.4 g/dL (ref 1.9–3.7)
Glucose, Bld: 128 mg/dL — ABNORMAL HIGH (ref 65–99)
Potassium: 4 mmol/L (ref 3.5–5.3)
Sodium: 139 mmol/L (ref 135–146)
Total Bilirubin: 0.2 mg/dL (ref 0.2–1.2)
Total Protein: 6.6 g/dL (ref 6.1–8.1)
eGFR: 70 mL/min/{1.73_m2} (ref >=60)

## 2023-08-16 LAB — PROTEIN / CREATININE RATIO, URINE
Creatinine, Urine: 149 mg/dL (ref 20–320)
Protein/Creat Ratio: 47 mg/g{creat} (ref 25–148)
Protein/Creatinine Ratio: 0.047 mg/mg{creat} (ref 0.025–0.148)
Total Protein, Urine: 7 mg/dL (ref 5–25)

## 2023-08-18 NOTE — Telephone Encounter (Signed)
 Requested Prescriptions  Pending Prescriptions Disp Refills   SYMBICORT  160-4.5 MCG/ACT inhaler [Pharmacy Med Name: SYMBICORT  160-4.5 MCG INHALER] 20.4 each 1    Sig: INHALE 2 PUFFS INTO THE LUNGS TWICE A DAY     Pulmonology:  Combination Products Passed - 08/18/2023 11:47 AM      Passed - Valid encounter within last 12 months    Recent Outpatient Visits           1 month ago General medical exam   [place] [facility] Austine Lefort, MD   7 months ago CKD (chronic kidney disease) stage 2, GFR 60-89 ml/min   [place] [facility] Austine Lefort, MD   1 year ago CKD (chronic kidney disease) stage 2, GFR 60-89 ml/min   [place] [facility] Pickard, Cisco Crest, MD

## 2023-09-11 ENCOUNTER — Other Ambulatory Visit: Payer: Self-pay | Admitting: Family Medicine

## 2023-09-11 DIAGNOSIS — J209 Acute bronchitis, unspecified: Secondary | ICD-10-CM

## 2023-09-12 NOTE — Telephone Encounter (Signed)
 Requested Prescriptions  Pending Prescriptions Disp Refills   albuterol  (VENTOLIN  HFA) 108 (90 Base) MCG/ACT inhaler [Pharmacy Med Name: ALBUTEROL  HFA (PROAIR ) INHALER] 8.5 each 0    Sig: INHALE 2 PUFFS INTO THE LUNGS UP TO EVERY 6HRS AS NEEDED FOR WHEEZING/SHORTNESS OF BREATH     Pulmonology:  Beta Agonists 2 Passed - 09/12/2023 12:09 PM      Passed - Last BP in normal range    BP Readings from Last 1 Encounters:  07/15/23 126/70         Passed - Last Heart Rate in normal range    Pulse Readings from Last 1 Encounters:  07/15/23 63         Passed - Valid encounter within last 12 months    Recent Outpatient Visits           1 month ago General medical exam   [place] [facility] Austine Lefort, MD   8 months ago CKD (chronic kidney disease) stage 2, GFR 60-89 ml/min   [place] [facility] Austine Lefort, MD   1 year ago CKD (chronic kidney disease) stage 2, GFR 60-89 ml/min   [place] [facility] Pickard, Cisco Crest, MD

## 2023-09-18 DIAGNOSIS — M549 Dorsalgia, unspecified: Secondary | ICD-10-CM | POA: Diagnosis not present

## 2023-09-18 DIAGNOSIS — M51362 Other intervertebral disc degeneration, lumbar region with discogenic back pain and lower extremity pain: Secondary | ICD-10-CM | POA: Diagnosis not present

## 2023-09-18 DIAGNOSIS — M5459 Other low back pain: Secondary | ICD-10-CM | POA: Diagnosis not present

## 2023-09-18 DIAGNOSIS — G894 Chronic pain syndrome: Secondary | ICD-10-CM | POA: Diagnosis not present

## 2023-09-18 DIAGNOSIS — M533 Sacrococcygeal disorders, not elsewhere classified: Secondary | ICD-10-CM | POA: Diagnosis not present

## 2023-09-18 DIAGNOSIS — M25551 Pain in right hip: Secondary | ICD-10-CM | POA: Diagnosis not present

## 2023-09-24 DIAGNOSIS — M533 Sacrococcygeal disorders, not elsewhere classified: Secondary | ICD-10-CM | POA: Diagnosis not present

## 2023-10-04 ENCOUNTER — Other Ambulatory Visit: Payer: Self-pay | Admitting: Family Medicine

## 2023-10-04 DIAGNOSIS — J209 Acute bronchitis, unspecified: Secondary | ICD-10-CM

## 2023-10-27 ENCOUNTER — Encounter: Payer: Self-pay | Admitting: Family Medicine

## 2023-10-27 NOTE — Telephone Encounter (Addendum)
 Patient is calling to ask why his albuterol  (VENTOLIN  HFA) 108 (90 Base) MCG/ACT inhaler [[id number]] was denied. Please advise

## 2023-10-28 ENCOUNTER — Other Ambulatory Visit: Payer: Self-pay

## 2023-10-28 DIAGNOSIS — J209 Acute bronchitis, unspecified: Secondary | ICD-10-CM

## 2023-10-28 MED ORDER — ALBUTEROL SULFATE HFA 108 (90 BASE) MCG/ACT IN AERS: INHALATION_SPRAY | RESPIRATORY_TRACT | 0 refills | Status: DC

## 2023-11-22 ENCOUNTER — Other Ambulatory Visit: Payer: Self-pay | Admitting: Family Medicine

## 2023-11-22 DIAGNOSIS — J209 Acute bronchitis, unspecified: Secondary | ICD-10-CM

## 2023-11-28 ENCOUNTER — Other Ambulatory Visit: Payer: Self-pay | Admitting: Family Medicine

## 2023-11-28 DIAGNOSIS — I10 Essential (primary) hypertension: Secondary | ICD-10-CM

## 2023-11-28 NOTE — Telephone Encounter (Signed)
 Requested Prescriptions  Pending Prescriptions Disp Refills   lisinopril  (ZESTRIL ) 10 MG tablet [Pharmacy Med Name: LISINOPRIL  10 MG TABLET] 90 tablet 0    Sig: TAKE 1 TABLET BY MOUTH EVERY DAY     Cardiovascular:  ACE Inhibitors Failed - 11/28/2023  2:11 PM      Failed - Cr in normal range and within 180 days    Creat  Date Value Ref Range Status  08/15/2023 1.31 (H) 0.60 - 1.29 mg/dL Final   Creatinine, Urine  Date Value Ref Range Status  08/15/2023 149 20 - 320 mg/dL Final         Passed - K in normal range and within 180 days    Potassium  Date Value Ref Range Status  08/15/2023 4.0 3.5 - 5.3 mmol/L Final         Passed - Patient is not pregnant      Passed - Last BP in normal range    BP Readings from Last 1 Encounters:  07/15/23 126/70         Passed - Valid encounter within last 6 months    Recent Outpatient Visits           4 months ago General medical exam   [place] [facility] Medicine Duanne Butler DASEN, MD   10 months ago CKD (chronic kidney disease) stage 2, GFR 60-89 ml/min   [place] [facility] Duanne Butler DASEN, MD   1 year ago CKD (chronic kidney disease) stage 2, GFR 60-89 ml/min   [place] [facility] Pickard, Butler DASEN, MD

## 2023-12-14 ENCOUNTER — Other Ambulatory Visit: Payer: Self-pay | Admitting: Family Medicine

## 2023-12-14 DIAGNOSIS — J4489 Other specified chronic obstructive pulmonary disease: Secondary | ICD-10-CM

## 2023-12-14 DIAGNOSIS — J209 Acute bronchitis, unspecified: Secondary | ICD-10-CM

## 2023-12-15 ENCOUNTER — Other Ambulatory Visit: Payer: Self-pay

## 2023-12-15 ENCOUNTER — Encounter: Payer: Self-pay | Admitting: Family Medicine

## 2023-12-15 DIAGNOSIS — J4489 Other specified chronic obstructive pulmonary disease: Secondary | ICD-10-CM

## 2023-12-15 DIAGNOSIS — J209 Acute bronchitis, unspecified: Secondary | ICD-10-CM

## 2023-12-15 MED ORDER — ALBUTEROL SULFATE HFA 108 (90 BASE) MCG/ACT IN AERS: INHALATION_SPRAY | RESPIRATORY_TRACT | 3 refills | Status: DC

## 2023-12-15 MED ORDER — BUDESONIDE-FORMOTEROL FUMARATE 160-4.5 MCG/ACT IN AERO
2.0000 | INHALATION_SPRAY | Freq: Two times a day (BID) | RESPIRATORY_TRACT | 3 refills | Status: AC
Start: 2023-12-15 — End: ?

## 2023-12-16 NOTE — Telephone Encounter (Signed)
 Duplicate request.  Requested Prescriptions  Pending Prescriptions Disp Refills   SYMBICORT  160-4.5 MCG/ACT inhaler [Pharmacy Med Name: SYMBICORT  160-4.5 MCG INHALER] 20.4 each 1    Sig: INHALE 2 PUFFS INTO THE LUNGS TWICE A DAY     Pulmonology:  Combination Products Passed - 12/16/2023  4:24 PM      Passed - Valid encounter within last 12 months    Recent Outpatient Visits           5 months ago General medical exam   [place] [facility] Duanne Butler DASEN, MD   11 months ago CKD (chronic kidney disease) stage 2, GFR 60-89 ml/min   [place] [facility] Duanne Butler DASEN, MD   1 year ago CKD (chronic kidney disease) stage 2, GFR 60-89 ml/min   [place] [facility] Medicine Pickard, Butler DASEN, MD               albuterol  (VENTOLIN  HFA) 108 (90 Base) MCG/ACT inhaler [Pharmacy Med Name: ALBUTEROL  HFA (PROAIR ) INHALER] 8.5 each 0    Sig: INHALE 2 PUFFS INTO THE LUNGS UP TO EVERY 6HRS AS NEEDED FOR WHEEZING/SHORTNESS OF BREATH     Pulmonology:  Beta Agonists 2 Passed - 12/16/2023  4:24 PM      Passed - Last BP in normal range    BP Readings from Last 1 Encounters:  07/15/23 126/70         Passed - Last Heart Rate in normal range    Pulse Readings from Last 1 Encounters:  07/15/23 63         Passed - Valid encounter within last 12 months    Recent Outpatient Visits           5 months ago General medical exam   [place] [facility] Duanne Butler DASEN, MD   11 months ago CKD (chronic kidney disease) stage 2, GFR 60-89 ml/min   [place] [facility] Medicine Duanne Butler DASEN, MD   1 year ago CKD (chronic kidney disease) stage 2, GFR 60-89 ml/min   [place] [facility] Pickard, Butler DASEN, MD

## 2024-01-19 DIAGNOSIS — M5416 Radiculopathy, lumbar region: Secondary | ICD-10-CM | POA: Diagnosis not present

## 2024-01-19 DIAGNOSIS — M542 Cervicalgia: Secondary | ICD-10-CM | POA: Diagnosis not present

## 2024-01-19 DIAGNOSIS — M87051 Idiopathic aseptic necrosis of right femur: Secondary | ICD-10-CM | POA: Diagnosis not present

## 2024-01-19 DIAGNOSIS — M51362 Other intervertebral disc degeneration, lumbar region with discogenic back pain and lower extremity pain: Secondary | ICD-10-CM | POA: Diagnosis not present

## 2024-02-26 ENCOUNTER — Other Ambulatory Visit: Payer: Self-pay | Admitting: Family Medicine

## 2024-02-26 DIAGNOSIS — I10 Essential (primary) hypertension: Secondary | ICD-10-CM

## 2024-03-12 ENCOUNTER — Ambulatory Visit (INDEPENDENT_AMBULATORY_CARE_PROVIDER_SITE_OTHER): Admitting: Family Medicine

## 2024-03-12 DIAGNOSIS — Z23 Encounter for immunization: Secondary | ICD-10-CM | POA: Diagnosis not present

## 2024-03-12 NOTE — Progress Notes (Signed)
 Patient is in office today for a nurse visit for fluzone Immunization. Patient Injection was given in the  Left deltoid. Patient tolerated injection well.

## 2024-03-20 ENCOUNTER — Other Ambulatory Visit: Payer: Self-pay | Admitting: Family Medicine

## 2024-03-20 DIAGNOSIS — J209 Acute bronchitis, unspecified: Secondary | ICD-10-CM

## 2024-03-22 NOTE — Telephone Encounter (Signed)
 Requested Prescriptions  Pending Prescriptions Disp Refills   albuterol  (VENTOLIN  HFA) 108 (90 Base) MCG/ACT inhaler [Pharmacy Med Name: ALBUTEROL  HFA (VENTOLIN ) INH] 18 each 3    Sig: INHALE 2 PUFFS INTO THE LUNGS UP TO EVERY 6HRS AS NEEDED FOR WHEEZING/SHORTNESS OF BREATH     Pulmonology:  Beta Agonists 2 Passed - 03/22/2024  2:53 PM      Passed - Last BP in normal range    BP Readings from Last 1 Encounters:  07/15/23 126/70         Passed - Last Heart Rate in normal range    Pulse Readings from Last 1 Encounters:  07/15/23 63         Passed - Valid encounter within last 12 months    Recent Outpatient Visits           1 week ago Need for vaccination   [place] [facility] Duanne Butler DASEN, MD   8 months ago General medical exam   [place] [facility] Duanne Butler DASEN, MD   1 year ago CKD (chronic kidney disease) stage 2, GFR 60-89 ml/min   [place] [facility] Medicine Duanne Butler DASEN, MD   1 year ago CKD (chronic kidney disease) stage 2, GFR 60-89 ml/min   [place] [facility] Pickard, Butler DASEN, MD

## 2024-04-08 ENCOUNTER — Encounter: Payer: Self-pay | Admitting: Family Medicine

## 2024-04-08 ENCOUNTER — Ambulatory Visit: Admitting: Family Medicine

## 2024-04-08 VITALS — BP 126/84 | HR 89 | Temp 98.3°F | Ht 68.0 in | Wt 232.4 lb

## 2024-04-08 DIAGNOSIS — R5382 Chronic fatigue, unspecified: Secondary | ICD-10-CM

## 2024-04-08 DIAGNOSIS — F411 Generalized anxiety disorder: Secondary | ICD-10-CM

## 2024-04-08 MED ORDER — LAMOTRIGINE 25 MG PO TABS: 25.0000 mg | ORAL_TABLET | Freq: Every day | ORAL | 0 refills | Status: DC

## 2024-04-08 NOTE — Progress Notes (Signed)
 Subjective:    Patient ID: Tim Christianne Raddle., male    DOB: Oct 23, 1980, 43 y.o.   MRN: [id number]  Patient is a 43 year old Caucasian gentleman with a history of hypertension, borderline stage III chronic kidney disease, tobacco abuse, dyslipidemia who presents today for follow-up of his chronic kidney disease.  However the patient is clearly distraught today.  He is tearful on exam.  He begins crying often during our encounter talking about how bad he feels.  He has 2 new tattoos.  1 is on his left forearm.  He has 1 tattoo on the right side of his neck.  He has gotten both of these tattoos within the last week.  He reports feeling extremely anxious.  He states he cannot sleep at night.  Cannot turn his mind off.  He is calling his psychiatrist asking to be seen earlier but they are unable to fit him into their schedule.  His appointment is not for another several weeks.  He is asking for something to help calm him down.  He is afraid he is going to die.  He is concerned that there may be cancer in his liver.  He wants me to check his lymph nodes.  He is concerned that there is something wrong.  He states that he just wants to make sure that he is here to help raise his son.  It is my clinical impression that the patient is dealing with mania from his bipolar.  He seems extremely anxious.  His mind seems to be racing as we discussed things today.  He has a fixation on the head and medical problems somewhere in his body.  He is not sleeping.  He is already on Seroquel 400 mg p.o. nightly, trazodone , fluvoxamine, and BuSpar. Past Medical History:  Diagnosis Date   Avascular necrosis of bone of right hip (HCC)    Cervicalgia    COPD (chronic obstructive pulmonary disease) (HCC)    GAD (generalized anxiety disorder)    Hypertension    Hypertriglyceridemia    Lumbago    Lumbosacral spondylosis without myelopathy    Myalgia and myositis, unspecified    Other symptoms referable to back    PSA  (psoriatic arthritis) (HCC)    Smoker    Thoracic spondylosis without myelopathy    Past Surgical History:  Procedure Laterality Date   DISTAL CLAVICLE EXCISION Right 2017   ROTATOR CUFF REPAIR  2010   right   TONSILLECTOMY AND ADENOIDECTOMY  Age 43   Current Outpatient Medications on File Prior to Visit  Medication Sig Dispense Refill   albuterol  (VENTOLIN  HFA) 108 (90 Base) MCG/ACT inhaler INHALE 2 PUFFS INTO THE LUNGS UP TO EVERY 6HRS AS NEEDED FOR WHEEZING/SHORTNESS OF BREATH 18 each 3   budesonide -formoterol  (SYMBICORT ) 160-4.5 MCG/ACT inhaler Inhale 2 puffs into the lungs in the morning and at bedtime. 20.4 each 3   buPROPion  (WELLBUTRIN  XL) 150 MG 24 hr tablet Take 1 tablet (150 mg total) by mouth daily. 90 tablet 1   fluvoxaMINE (LUVOX) 50 MG tablet Take 100 mg by mouth at bedtime. Take 50mg  in 50 mg at night per pt-07/15/2023.     lisinopril  (ZESTRIL ) 10 MG tablet TAKE 1 TABLET BY MOUTH EVERY DAY 90 tablet 0   naloxone (NARCAN) nasal spray 4 mg/0.1 mL Place 0.4 mg into the nose once.     nitroGLYCERIN  (NITROSTAT ) 0.4 MG SL tablet Place 1 tablet (0.4 mg total) under the tongue every 5 (five) minutes as needed for  chest pain. 25 tablet 3   omega-3 acid ethyl esters (LOVAZA ) 1 g capsule Take 1 capsule (1 g total) by mouth 2 (two) times daily. 180 capsule 3   Omega-3 Fatty Acids (FISH OIL) 1000 MG CAPS Take 1,000 each by mouth. 2 times daily    one in morning   and one in evening     omeprazole  (PRILOSEC) 20 MG capsule Take 1 capsule (20 mg total) by mouth daily. 30 capsule 10   Oxycodone HCl 10 MG TABS Take 10 mg by mouth 4 (four) times daily.      pregabalin (LYRICA) 75 MG capsule Take 75 mg by mouth 2 (two) times daily.     QUEtiapine (SEROQUEL) 400 MG tablet Take 400 mg by mouth at bedtime.     tiZANidine (ZANAFLEX) 4 MG tablet Take 6 mg by mouth 3 (three) times daily. Take 1-1/2 tabs 3x daily     traZODone  (DESYREL ) 100 MG tablet Take 100 mg by mouth at bedtime.     No current  facility-administered medications on file prior to visit.   Allergies  Allergen Reactions   Ceclor [Cefaclor]    Sulfa Antibiotics    Misc. Sulfonamide Containing Compounds Rash   Social History   Socioeconomic History   Marital status: Single    Spouse name: Not on file   Number of children: Not on file   Years of education: Not on file   Highest education level: Some college, no degree  Occupational History   Not on file  Tobacco Use   Smoking status: Every Day    Current packs/day: 1.50    Average packs/day: 1.5 packs/day for 26.9 years (40.4 ttl pk-yrs)    Types: Cigarettes    Start date: 04/29/1997   Smokeless tobacco: Never  Substance and Sexual Activity   Alcohol use: Yes    Alcohol/week: 0.0 standard drinks of alcohol    Comment: occasional once a month   Drug use: No   Sexual activity: Not Currently  Other Topics Concern   Not on file  Social History Narrative   05/21/2012 AHW Garrel was born and grew up in [place], [place] . He reports he had a good childhood. He has one sister who is 3 years younger than he. His parents are still married. He graduated from high school and attended [employer] studying information systems for 2 years but did not turn a degree. In high school he was a member of the [employer]. 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 He has never been married he has one son who is currently 23 years old. He shares custody with his son's mother, but the patient keeps his son most of the time. He denies any current legal issues. He reports that his social support system consists of his mother, father, and sister. He currently lives with his family. 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 He reports that he believes in God but he does not practice any religion. 05/21/2012 AHW   Social Drivers of Health   Tobacco Use: High Risk (08/14/2023)   Patient History    Smoking Tobacco Use:  Every Day    Smokeless Tobacco Use: Never    Passive Exposure: Not on file  Financial Resource Strain: High Risk (08/14/2023)   Overall Financial Resource Strain (CARDIA)    Difficulty of Paying Living Expenses: Very hard  Food Insecurity: Food Insecurity Present (08/14/2023)   Hunger Vital Sign  Worried About Programme Researcher, Broadcasting/film/video in the Last Year: Often true    Ran Out of Food in the Last Year: Often true  Transportation Needs: No Transportation Needs (08/14/2023)   PRAPARE - Administrator, Civil Service (Medical): No    Lack of Transportation (Non-Medical): No  Physical Activity: Unknown (08/14/2023)   Exercise Vital Sign    Days of Exercise per Week: Patient declined    Minutes of Exercise per Session: 0 min  Stress: Stress Concern Present (08/14/2023)   [employer] of Occupational Health - Occupational Stress Questionnaire    Feeling of Stress : To some extent  Social Connections: Unknown (08/14/2023)   Social Connection and Isolation Panel    Frequency of Communication with Friends and Family: Patient declined    Frequency of Social Gatherings with Friends and Family: Patient declined    Attends Religious Services: Patient declined    Active Member of Clubs or Organizations: Patient declined    Attends Banker Meetings: Patient declined    Marital Status: Never married  Intimate Partner Violence: Not At Risk (08/14/2023)   Humiliation, Afraid, Rape, and Kick questionnaire    Fear of Current or Ex-Partner: No    Emotionally Abused: No    Physically Abused: No    Sexually Abused: No  Depression (PHQ2-9): High Risk (08/14/2023)   Depression (PHQ2-9)    PHQ-2 Score: 12  Alcohol Screen: Low Risk (08/14/2023)   Alcohol Screen    Last Alcohol Screening Score (AUDIT): 0  Housing: High Risk (08/14/2023)   Housing Stability Vital Sign    Unable to Pay for Housing in the Last Year: Yes    Number of Times Moved in the Last Year: 0    Homeless in the Last  Year: No  Utilities: Not At Risk (08/14/2023)   [facility] Utilities    Threatened with loss of utilities: No  Health Literacy: Adequate Health Literacy (08/14/2023)   B1300 Health Literacy    Frequency of need for help with medical instructions: Never    Review of Systems  All other systems reviewed and are negative.      Objective:   Physical Exam Vitals reviewed.  Constitutional:      General: He is not in acute distress.    Appearance: Normal appearance. He is obese. He is not ill-appearing, toxic-appearing or diaphoretic.  HENT:     Head: Normocephalic and atraumatic.     Right Ear: Tympanic membrane and ear canal normal. There is no impacted cerumen.     Left Ear: Tympanic membrane and ear canal normal. There is no impacted cerumen.     Nose: Nose normal. No congestion or rhinorrhea.     Mouth/Throat:     Mouth: Mucous membranes are moist.     Pharynx: Oropharynx is clear. No oropharyngeal exudate or posterior oropharyngeal erythema.  Eyes:     General: No scleral icterus.       Right eye: No discharge.        Left eye: No discharge.     Extraocular Movements: Extraocular movements intact.     Conjunctiva/sclera: Conjunctivae normal.     Pupils: Pupils are equal, round, and reactive to light.  Neck:     Vascular: No carotid bruit.  Cardiovascular:     Rate and Rhythm: Normal rate and regular rhythm.     Heart sounds: Normal heart sounds. No murmur heard.    No friction rub. No gallop.  Pulmonary:  Effort: Pulmonary effort is normal. No respiratory distress.     Breath sounds: Normal breath sounds. No stridor. No wheezing, rhonchi or rales.  Abdominal:     General: Abdomen is flat. Bowel sounds are normal. There is no distension.     Palpations: There is no mass.     Tenderness: There is no abdominal tenderness. There is no right CVA tenderness, left CVA tenderness, guarding or rebound.     Hernia: No hernia is present.  Musculoskeletal:     Cervical back: Normal  range of motion. No rigidity or tenderness.     Right lower leg: No edema.     Left lower leg: No edema.  Lymphadenopathy:     Cervical: No cervical adenopathy.  Skin:    General: Skin is warm.     Coloration: Skin is not jaundiced.     Findings: No bruising, erythema, lesion or rash.  Neurological:     General: No focal deficit present.     Mental Status: He is alert and oriented to person, place, and time. Mental status is at baseline.     Cranial Nerves: No cranial nerve deficit.     Sensory: No sensory deficit.     Motor: No weakness.     Coordination: Coordination normal.     Gait: Gait normal.     Deep Tendon Reflexes: Reflexes normal.  Psychiatric:        Mood and Affect: Mood is anxious. Affect is tearful.        Speech: Speech normal.        Behavior: Behavior is hyperactive.        Thought Content: Thought content normal.        Cognition and Memory: Cognition and memory normal.        Judgment: Judgment normal.           Assessment & Plan:   Chronic fatigue - Plan: CBC with Differential/Platelet, Comprehensive metabolic panel with GFR, TSH, Sedimentation rate, Urinalysis, Routine w reflex microscopic  GAD (generalized anxiety disorder) Patient seems to be focused that he is going to die.  He seems to be overly concerned something is wrong.  He states that he just feels tired.  He feels like there is something off.  However clearly, the patient appears to be dealing with severe anxiety.  He also seems to have racing thoughts and pressured speech and hyperactivity.  I am concerned that the patient is demonstrating signs of hypomania.  I recommended that he contact his psychiatrist and try to be seen earlier.  He states that he has been trying to call them every day and that they are unable to work him in any sooner.  He does have an appointment to see his psychologist later this week.  I recommended that we try Lamictal 25 mg p.o. nightly as a mood stabilizer.  I would  like to uptitrate this to 50 mg.  I hesitate to start the medication but I really feel the majority of his symptoms are related to his poorly controlled anxiety and mood symptoms.  Hopefully this will help start the patient down a path to improving at least until he can see the psychologist/psychiatrist to make additional changes.  Meanwhile given the fatigue, I promised the patient a thorough diagnostic workup including a CBC a CMP a sed rate and a urinalysis along with a TSH to evaluate for any underlying additional medical problems.

## 2024-04-09 ENCOUNTER — Ambulatory Visit: Payer: Self-pay | Admitting: Family Medicine

## 2024-04-09 LAB — CBC WITH DIFFERENTIAL/PLATELET
Absolute Lymphocytes: 910 {cells}/uL (ref 850–3900)
Absolute Monocytes: 440 {cells}/uL (ref 200–950)
Basophils Absolute: 10 {cells}/uL (ref 0–200)
Basophils Relative: 0.1 %
Eosinophils Absolute: 0 {cells}/uL — ABNORMAL LOW (ref 15–500)
Eosinophils Relative: 0 %
HCT: 39.9 % (ref 39.4–51.1)
Hemoglobin: 13.4 g/dL (ref 13.2–17.1)
MCH: 30.4 pg (ref 27.0–33.0)
MCHC: 33.6 g/dL (ref 31.6–35.4)
MCV: 90.5 fL (ref 81.4–101.7)
MPV: 10.6 fL (ref 7.5–12.5)
Monocytes Relative: 4.4 %
Neutro Abs: 8640 {cells}/uL — ABNORMAL HIGH (ref 1500–7800)
Neutrophils Relative %: 86.4 %
Platelets: 300 Thousand/uL (ref 140–400)
RBC: 4.41 Million/uL (ref 4.20–5.80)
RDW: 13.2 % (ref 11.0–15.0)
Total Lymphocyte: 9.1 %
WBC: 10 Thousand/uL (ref 3.8–10.8)

## 2024-04-09 LAB — URINALYSIS, ROUTINE W REFLEX MICROSCOPIC
Bilirubin Urine: NEGATIVE
Glucose, UA: NEGATIVE
Hgb urine dipstick: NEGATIVE
Ketones, ur: NEGATIVE
Leukocytes,Ua: NEGATIVE
Nitrite: NEGATIVE
Protein, ur: NEGATIVE
Specific Gravity, Urine: 1.024 (ref 1.001–1.035)
pH: <=5 — AB (ref 5.0–8.0)

## 2024-04-09 LAB — COMPREHENSIVE METABOLIC PANEL WITH GFR
AG Ratio: 2.1 (calc) (ref 1.0–2.5)
ALT: 20 U/L (ref 9–46)
AST: 14 U/L (ref 10–40)
Albumin: 4.6 g/dL (ref 3.6–5.1)
Alkaline phosphatase (APISO): 73 U/L (ref 36–130)
BUN: 15 mg/dL (ref 7–25)
CO2: 28 mmol/L (ref 20–32)
Calcium: 9.7 mg/dL (ref 8.6–10.3)
Chloride: 100 mmol/L (ref 98–110)
Creat: 1.07 mg/dL (ref 0.60–1.29)
Globulin: 2.2 g/dL (ref 1.9–3.7)
Glucose, Bld: 118 mg/dL — ABNORMAL HIGH (ref 65–99)
Potassium: 3.8 mmol/L (ref 3.5–5.3)
Sodium: 136 mmol/L (ref 135–146)
Total Bilirubin: 0.3 mg/dL (ref 0.2–1.2)
Total Protein: 6.8 g/dL (ref 6.1–8.1)
eGFR: 89 mL/min/1.73m2 (ref >=60)

## 2024-04-09 LAB — TSH: TSH: 0.81 m[IU]/L (ref 0.40–4.50)

## 2024-04-09 LAB — SEDIMENTATION RATE: Sed Rate: 19 mm/h — ABNORMAL HIGH (ref 0–15)

## 2024-04-26 ENCOUNTER — Telehealth: Payer: Self-pay

## 2024-04-26 ENCOUNTER — Other Ambulatory Visit: Payer: Self-pay | Admitting: Family Medicine

## 2024-04-26 MED ORDER — DOXYCYCLINE HYCLATE 100 MG PO TABS: 100.0000 mg | ORAL_TABLET | Freq: Two times a day (BID) | ORAL | 0 refills | Status: DC

## 2024-04-26 NOTE — Telephone Encounter (Signed)
 Message sent to [facility] on 04/24/24. Pt has a tattoo that Is getting infected. Red and irritated. Pt got twoi tattoos 2 weeks ago. No fever. Please address.

## 2024-04-26 NOTE — Telephone Encounter (Signed)
Noted  -LS

## 2024-05-07 ENCOUNTER — Ambulatory Visit (HOSPITAL_COMMUNITY)
Admission: EM | Admit: 2024-05-07 | Discharge: 2024-05-08 | Disposition: A | Discharge status: Home / Self Care | Attending: Family Medicine | Admitting: Family Medicine

## 2024-05-07 DIAGNOSIS — G8929 Other chronic pain: Secondary | ICD-10-CM | POA: Diagnosis not present

## 2024-05-07 DIAGNOSIS — R45851 Suicidal ideations: Secondary | ICD-10-CM | POA: Diagnosis not present

## 2024-05-07 DIAGNOSIS — F17210 Nicotine dependence, cigarettes, uncomplicated: Secondary | ICD-10-CM | POA: Insufficient documentation

## 2024-05-07 DIAGNOSIS — R9431 Abnormal electrocardiogram [ECG] [EKG]: Secondary | ICD-10-CM

## 2024-05-07 DIAGNOSIS — E781 Pure hyperglyceridemia: Secondary | ICD-10-CM | POA: Diagnosis not present

## 2024-05-07 DIAGNOSIS — R001 Bradycardia, unspecified: Secondary | ICD-10-CM

## 2024-05-07 DIAGNOSIS — F1920 Other psychoactive substance dependence, uncomplicated: Secondary | ICD-10-CM | POA: Diagnosis not present

## 2024-05-07 DIAGNOSIS — F3162 Bipolar disorder, current episode mixed, moderate: Secondary | ICD-10-CM | POA: Insufficient documentation

## 2024-05-07 DIAGNOSIS — I4510 Unspecified right bundle-branch block: Secondary | ICD-10-CM

## 2024-05-07 DIAGNOSIS — M25551 Pain in right hip: Secondary | ICD-10-CM | POA: Diagnosis not present

## 2024-05-07 LAB — COMPREHENSIVE METABOLIC PANEL WITH GFR
ALT: 35 U/L (ref 0–44)
AST: 24 U/L (ref 15–41)
Albumin: 4.4 g/dL (ref 3.5–5.0)
Alkaline Phosphatase: 97 U/L (ref 38–126)
Anion gap: 10 (ref 5–15)
BUN: 15 mg/dL (ref 6–20)
CO2: 27 mmol/L (ref 22–32)
Calcium: 9.6 mg/dL (ref 8.9–10.3)
Chloride: 94 mmol/L — ABNORMAL LOW (ref 98–111)
Creatinine, Ser: 1.36 mg/dL — ABNORMAL HIGH (ref 0.61–1.24)
GFR, Estimated: >60 mL/min
Glucose, Bld: 96 mg/dL (ref 70–99)
Potassium: 4.2 mmol/L (ref 3.5–5.1)
Sodium: 131 mmol/L — ABNORMAL LOW (ref 135–145)
Total Bilirubin: 0.4 mg/dL (ref 0.0–1.2)
Total Protein: 7 g/dL (ref 6.5–8.1)

## 2024-05-07 LAB — POCT URINE DRUG SCREEN - MANUAL ENTRY (I-SCREEN)
POC Amphetamine UR: POSITIVE — AB
POC Buprenorphine (BUP): NOT DETECTED
POC Cocaine UR: NOT DETECTED
POC Marijuana UR: NOT DETECTED
POC Methadone UR: NOT DETECTED
POC Methamphetamine UR: NOT DETECTED
POC Morphine: POSITIVE — AB
POC Oxazepam (BZO): POSITIVE — AB
POC Oxycodone UR: POSITIVE — AB
POC Secobarbital (BAR): NOT DETECTED

## 2024-05-07 LAB — CBC WITH DIFFERENTIAL/PLATELET
Abs Immature Granulocytes: 0.02 K/uL (ref 0.00–0.07)
Basophils Absolute: 0 K/uL (ref 0.0–0.1)
Basophils Relative: 1 %
Eosinophils Absolute: 0.1 K/uL (ref 0.0–0.5)
Eosinophils Relative: 3 %
HCT: 39.7 % (ref 39.0–52.0)
Hemoglobin: 13.9 g/dL (ref 13.0–17.0)
Immature Granulocytes: 0 %
Lymphocytes Relative: 40 %
Lymphs Abs: 2.1 K/uL (ref 0.7–4.0)
MCH: 31.8 pg (ref 26.0–34.0)
MCHC: 35 g/dL (ref 30.0–36.0)
MCV: 90.8 fL (ref 80.0–100.0)
Monocytes Absolute: 0.3 K/uL (ref 0.1–1.0)
Monocytes Relative: 6 %
Neutro Abs: 2.7 K/uL (ref 1.7–7.7)
Neutrophils Relative %: 50 %
Platelets: 202 K/uL (ref 150–400)
RBC: 4.37 MIL/uL (ref 4.22–5.81)
RDW: 12.7 % (ref 11.5–15.5)
WBC: 5.3 K/uL (ref 4.0–10.5)
nRBC: 0 % (ref 0.0–0.2)

## 2024-05-07 LAB — LIPID PANEL
Cholesterol: 160 mg/dL (ref 0–200)
HDL: 31 mg/dL — ABNORMAL LOW
LDL Cholesterol: 79 mg/dL (ref 0–99)
Total CHOL/HDL Ratio: 5.1 ratio
Triglycerides: 251 mg/dL — ABNORMAL HIGH
VLDL: 50 mg/dL — ABNORMAL HIGH (ref 0–40)

## 2024-05-07 LAB — HEMOGLOBIN A1C
Hgb A1c MFr Bld: 5.5 % (ref 4.8–5.6)
Mean Plasma Glucose: 111.15 mg/dL

## 2024-05-07 LAB — GLUCOSE, CAPILLARY: Glucose-Capillary: 114 mg/dL — ABNORMAL HIGH (ref 70–99)

## 2024-05-07 MED ORDER — FLUVOXAMINE MALEATE 50 MG PO TABS
100.0000 mg | ORAL_TABLET | Freq: Two times a day (BID) | ORAL | Status: DC
  Administered 2024-05-07 – 2024-05-08 (×2): 100 mg via ORAL
  Filled 2024-05-07 (×2): qty 2

## 2024-05-07 MED ORDER — BUSPIRONE HCL 10 MG PO TABS
10.0000 mg | ORAL_TABLET | Freq: Two times a day (BID) | ORAL | Status: DC
  Administered 2024-05-07 – 2024-05-08 (×2): 10 mg via ORAL
  Filled 2024-05-07 (×2): qty 1

## 2024-05-07 MED ORDER — DIPHENHYDRAMINE HCL 50 MG PO CAPS: 50.0000 mg | ORAL_CAPSULE | Freq: Three times a day (TID) | ORAL | Status: DC | PRN

## 2024-05-07 MED ORDER — LISINOPRIL 10 MG PO TABS
10.0000 mg | ORAL_TABLET | Freq: Every day | ORAL | Status: DC
  Administered 2024-05-08: 10 mg via ORAL
  Filled 2024-05-07: qty 1

## 2024-05-07 MED ORDER — HALOPERIDOL 5 MG PO TABS: 5.0000 mg | ORAL_TABLET | Freq: Three times a day (TID) | ORAL | Status: DC | PRN

## 2024-05-07 MED ORDER — ACETAMINOPHEN 325 MG PO TABS
650.0000 mg | ORAL_TABLET | Freq: Four times a day (QID) | ORAL | Status: DC | PRN
  Administered 2024-05-07 – 2024-05-08 (×2): 650 mg via ORAL
  Filled 2024-05-07 (×2): qty 2

## 2024-05-07 MED ORDER — HALOPERIDOL LACTATE 5 MG/ML IJ SOLN: 10.0000 mg | Freq: Three times a day (TID) | INTRAMUSCULAR | Status: DC | PRN

## 2024-05-07 MED ORDER — TRAZODONE HCL 100 MG PO TABS
200.0000 mg | ORAL_TABLET | Freq: Every day | ORAL | Status: DC
  Administered 2024-05-07: 200 mg via ORAL
  Filled 2024-05-07: qty 2

## 2024-05-07 MED ORDER — HALOPERIDOL LACTATE 5 MG/ML IJ SOLN: 5.0000 mg | Freq: Three times a day (TID) | INTRAMUSCULAR | Status: DC | PRN

## 2024-05-07 MED ORDER — LORAZEPAM 2 MG/ML IJ SOLN: 2.0000 mg | Freq: Three times a day (TID) | INTRAMUSCULAR | Status: DC | PRN

## 2024-05-07 MED ORDER — MAGNESIUM HYDROXIDE 400 MG/5ML PO SUSP: 30.0000 mL | Freq: Every day | ORAL | Status: DC | PRN

## 2024-05-07 MED ORDER — QUETIAPINE FUMARATE 400 MG PO TABS
400.0000 mg | ORAL_TABLET | Freq: Every day | ORAL | Status: DC
  Administered 2024-05-07: 400 mg via ORAL
  Filled 2024-05-07: qty 1

## 2024-05-07 MED ORDER — DIPHENHYDRAMINE HCL 50 MG/ML IJ SOLN: 50.0000 mg | Freq: Three times a day (TID) | INTRAMUSCULAR | Status: DC | PRN

## 2024-05-07 MED ORDER — OXYCODONE HCL 5 MG PO TABS
10.0000 mg | ORAL_TABLET | Freq: Four times a day (QID) | ORAL | Status: DC | PRN
  Administered 2024-05-08: 10 mg via ORAL
  Filled 2024-05-07: qty 2

## 2024-05-07 MED ORDER — LAMOTRIGINE 100 MG PO TABS
100.0000 mg | ORAL_TABLET | Freq: Every day | ORAL | Status: DC
  Administered 2024-05-08: 100 mg via ORAL
  Filled 2024-05-07: qty 1

## 2024-05-07 MED ORDER — PREGABALIN 75 MG PO CAPS
75.0000 mg | ORAL_CAPSULE | Freq: Two times a day (BID) | ORAL | Status: DC
  Administered 2024-05-07 – 2024-05-08 (×2): 75 mg via ORAL
  Filled 2024-05-07 (×2): qty 1

## 2024-05-07 MED ORDER — ALUM & MAG HYDROXIDE-SIMETH 200-200-20 MG/5ML PO SUSP: 30.0000 mL | ORAL | Status: DC | PRN

## 2024-05-07 NOTE — ED Notes (Signed)
  Picked up labs one red tube, two lavender tubes and light green tube, with patients name, labels and req order

## 2024-05-07 NOTE — BH Assessment (Signed)
 Comprehensive Clinical Assessment (CCA) Note  05/07/2024 Tim [name]. [id number]  DISPOSITION: Pt was evaluated by Sherrell Culver, NP who recommended Pt be admitted to [facility] for continuous assessment and re-evaluated tomorrow.  The patient demonstrates the following risk factors for suicide: Chronic risk factors for suicide include: psychiatric disorder of bipolar disorder. Acute risk factors for suicide include: family or marital conflict. Protective factors for this patient include: positive social support, positive therapeutic relationship, and responsibility to others (children, family). Considering these factors, the overall suicide risk at this point appears to be moderate. Patient is not appropriate for outpatient follow up.  Pt presents via law enforcement after being petitioned for involuntary commitment by his sister, Tim [name] [phone]. Affidavit and petition states: Respondent states he wanted to hold a gun to his head and shoot his brains out. He states he wants to kill himself. He thinks someone is always watching him. He has been abusing prescription pain pills on a daily basis and also using some kind of synthetic drug. He has been diagnosed with bipolar manic depression.  Pt is extremely drowsy upon assessment and took several minutes of rousing Pt before he was alert enough to answer questions. Pt says he was staying at a motel last night and did not sleep well. He says he lives with his mother, sister, and Pt's 56 year old old. He states he is moving to an apartment in [place], [place] because his family is too controlling and always has me under their thumb. He says he has chronic back and hip pain and has also been diagnosed with bipolar disorder. Pt describes his mood recently as frustrated. He reports problems with sleep. He says his family is angry at him and thinks I'm crazy because he believes he is being monitored by unknown people. He states there are small holes in  the walls of his residence where someone has installed recording devices. He insists he has found cameras in the walls, electrical outlets, and light fixtures. He acknowledges sending a text to his sister stating he was going to shoot himself and paint the walls red. He denies he intended to kill himself and says he was expressing frustration. He denies access to firearms, adding that his uncle owns a gun, which is in a locked box that Pt is unable to open. He denies history of suicide attempts. He denies homicidal ideation or history of violence. He denies auditory hallucinations. He states he has seen bugs crawling in his residence which might not be real.   Pt reports a history of alcohol abuse but has maintained sobriety. He also states he was using kratom, last use November 2025.He denies abusing pain medications. He denies abusing other substances.  Pt says he has been on disability due to medical and mental health diagnosis. He denies current legal problems. He denies history of abuse or trauma. He states he is receiving therapy with Tim [name], [facility], LCAS at [facility]. He states he is receiving medications from Dr. Charlie Dolores. He states his medications include Luvox , fluoxetine, Seroquel , and Trazodone . He says he is prescribed oxycodone  10 mg five times daily. He denies abusing medications.  Pt is casually dressed, drowsy, and oriented x4. Pt speaks in a clear tone, at moderate volume and slow pace. Motor behavior appears normal. Eye contact is fair. Pt's mood is frustrated and affect is congruent with mood. Thought process is coherent and relevant. Insight is limited and judgment is currently impaired.  Chief Complaint: No chief complaint on file.  Visit Diagnosis:  F31.9 Bipolar I disorder, mixed Rule out: F11.20 Opioid use disorder   CCA Screening, Triage and Referral (STR)  Patient Reported Information How did you hear about us ? Other (Comment) Government Social Research Officer)  What Is the Reason for Your Visit/Call Today? Per the IVC (please review for reference), the respondent stated he wanted to hold a gun to his head and shoot his brains out, states he wants to kill himself, thinks someone is always watching him, has been abusing prescription pain pill on a daily basis and also using some type of synthetic drug, has been diagnosed w/ bipolar manic depression.  How Long Has This Been Causing You Problems? <Week  What Do You Feel Would Help You the Most Today? Treatment for Depression or other mood problem   Have You Recently Had Any Thoughts About Hurting Yourself? No  Are You Planning to Commit Suicide/Harm Yourself At This time? No   Flowsheet Row ED from 05/07/2024 in [facility] ED from 05/22/2020 in [facility] at [facility]  C-SSRS RISK CATEGORY No Risk No Risk    Have you Recently Had Thoughts About Hurting Someone Sherral? No  Are You Planning to Harm Someone at This Time? No  Explanation: Pt reports making threats of suicide out of frustration. He denies current suicidal ideation.   Have You Used Any Alcohol or Drugs in the Past 24 Hours? No  How Long Ago Did You Use Drugs or Alcohol? No data recorded What Did You Use and How Much? No data recorded  Do You Currently Have a Therapist/Psychiatrist? Yes  Name of Therapist/Psychiatrist: Name of Therapist/Psychiatrist: Asberry [name], [facility], LCAS   Have You Been Recently Discharged From Any Office Practice or Programs? No  Explanation of Discharge From Practice/Program: No data recorded    CCA Screening Triage Referral Assessment Type of Contact: Face-to-Face  Telemedicine Service Delivery:   Is this Initial or Reassessment?   Date Telepsych consult ordered in [facility]:    Time Telepsych consult ordered in [facility]:    Location of Assessment: [facility] Assessment Services  Provider Location: [facility] Assessment  Services   Collateral Involvement: Pt's sister: Tim [name]   Does Patient Have a Automotive Engineer Guardian? No  Legal Guardian Contact Information: Pt does not have a legal guardian  Copy of Legal Guardianship Form: -- (Pt does not have a legal guardian)  Legal Guardian Notified of Arrival: -- (Pt does not have a legal guardian)  Legal Guardian Notified of Pending Discharge: -- (Pt does not have a legal guardian)  If Minor and Not Living with Parent(s), Who has Custody? Pt is an adult  Is CPS involved or ever been involved? Never  Is APS involved or ever been involved? Never   Patient Determined To Be At Risk for Harm To Self or Others Based on Review of Patient Reported Information or Presenting Complaint? Yes, for Self-Harm  Method: Plan without intent  Availability of Means: No access or NA  Intent: Vague intent or NA  Notification Required: No need or identified person  Additional Information for Danger to Others Potential: No data recorded Additional Comments for Danger to Others Potential: Pt acknowledges threatening to shoot himself in the head. He denies intent or means.  Are There Guns or Other Weapons in Your Home? No  Types of Guns/Weapons: Pt says he does not have access to a gun  Are These Weapons  Safely Secured?                            -- (Pt says his uncle owns a gun but it is locked and Pt cannot access it.)  Who Could Verify You Are Able To Have These Secured: Pt's sister  Do You Have any Outstanding Charges, Pending Court Dates, Parole/Probation? Pt denies current legal problems.  Contacted To Inform of Risk of Harm To Self or Others: Family/Significant Other:    Does Patient Present under Involuntary Commitment? Yes    [place] of Residence: [place]   Patient Currently Receiving the Following Services: Medication Management; Individual Therapy   Determination of Need: Urgent (48 hours)   Options For Referral: Inpatient  Hospitalization; [facility]; Outpatient Therapy; Medication Management     CCA Biopsychosocial Patient Reported Schizophrenia/Schizoaffective Diagnosis in Past: No   Strengths: Pt participates in outpatient treatment.   Mental Health Symptoms Depression:  Sleep (too much or little); Change in energy/activity; Difficulty Concentrating; Fatigue   Duration of Depressive symptoms: Duration of Depressive Symptoms: Greater than two weeks   Mania:  Change in energy/activity   Anxiety:   Tension; Sleep; Difficulty concentrating; Fatigue   Psychosis:  Delusions   Duration of Psychotic symptoms: Duration of Psychotic Symptoms: Less than six months   Trauma:  None   Obsessions:  None   Compulsions:  None   Inattention:  None   Hyperactivity/Impulsivity:  None   Oppositional/Defiant Behaviors:  None   Emotional Irregularity:  None   Other Mood/Personality Symptoms:  Pt has diagnosis of bipolar disorder    Mental Status Exam Appearance and self-care  Stature:  Average   Weight:  Overweight   Clothing:  Casual   Grooming:  Normal   Cosmetic use:  Age appropriate   Posture/gait:  Slumped   Motor activity:  Not Remarkable   Sensorium  Attention:  Distractible   Concentration:  Variable   Orientation:  X5   Recall/memory:  Normal   Affect and Mood  Affect:  Appropriate   Mood:  Other (Comment) Software Engineer)   Relating  Eye contact:  Fleeting   Facial expression:  Responsive   Attitude toward examiner:  Cooperative   Thought and Language  Speech flow: Slow   Thought content:  Delusions   Preoccupation:  None   Hallucinations:  None   Organization:  Coherent   [employer] of Knowledge:  Average   Intelligence:  Average   Abstraction:  Normal   Judgement:  Fair   Dance Movement Psychotherapist:  Variable   Insight:  Fair   Decision Making:  Vacilates   Social Functioning  Social Maturity:  Responsible   Social Judgement:   Normal   Stress  Stressors:  Family conflict   Coping Ability:  Exhausted   Skill Deficits:  None   Supports:  Family     Religion: Religion/Spirituality Are You A Religious Person?: Yes What is Your Religious Affiliation?: Christian How Might This Affect Treatment?: Pt participates in 12-step program  Leisure/Recreation: Leisure / Recreation Do You Have Hobbies?: Yes Leisure and Hobbies: Fishing  Exercise/Diet: Exercise/Diet Do You Exercise?: No Have You Gained or Lost A Significant Amount of Weight in the Past Six Months?: No Do You Follow a Special Diet?: No Do You Have Any Trouble Sleeping?: Yes Explanation of Sleeping Difficulties: Pt reports variable sleep. He takes medications for sleep.   CCA Employment/Education Employment/Work Situation: Employment / Work Psychologist, Occupational  Employment Situation: On disability Why is Patient on Disability: Medical and mental health problems How Long has Patient Been on Disability: Since 2018 Patient's Job has Been Impacted by Current Illness: No Has Patient ever Been in the [employer]?: No  Education: Education Is Patient Currently Attending School?: No Last Grade Completed: 13 Did You Attend College?: Yes What Type of College Degree Do you Have?: Pt did not complete degree Did You Have An Individualized Education Program (IIEP): No Did You Have Any Difficulty At School?: No Patient's Education Has Been Impacted by Current Illness: No   CCA Family/Childhood History Family and Relationship History: Family history Marital status: Single Does patient have children?: Yes How many children?: 1 How is patient's relationship with their children?: 13 year old son  Childhood History:  Childhood History By whom was/is the patient raised?: Both parents Did patient suffer any verbal/emotional/physical/sexual abuse as a child?: No Did patient suffer from severe childhood neglect?: No Has patient ever been sexually  abused/assaulted/raped as an adolescent or adult?: No Was the patient ever a victim of a crime or a disaster?: No Witnessed domestic violence?: No Has patient been affected by domestic violence as an adult?: No       CCA Substance Use Alcohol/Drug Use: Alcohol / Drug Use Pain Medications: Pt is prescribed Oxycodone . He denies abuse. Prescriptions: Pt denies abusing prescription medications Over the Counter: Pt denies abuse History of alcohol / drug use?: Yes (Pt reports history of abusing alcohll and kratom) Longest period of sobriety (when/how long): Pt reports he has not used kratom since November 2025 Negative Consequences of Use: Personal relationships Withdrawal Symptoms: None                         ASAM's:  Six Dimensions of Multidimensional Assessment  Dimension 1:  Acute Intoxication and/or Withdrawal Potential:      Dimension 2:  Biomedical Conditions and Complications:      Dimension 3:  Emotional, Behavioral, or Cognitive Conditions and Complications:     Dimension 4:  Readiness to Change:     Dimension 5:  Relapse, Continued use, or Continued Problem Potential:     Dimension 6:  Recovery/Living Environment:     ASAM Severity Score:    ASAM Recommended Level of Treatment:     Substance use Disorder (SUD)    Recommendations for Services/Supports/Treatments:    Disposition Recommendation per psychiatric provider: We recommend inpatient psychiatric hospitalization after medical hospitalization. Patient has been involuntarily committed on 05/07/2024.    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   Nocturnal hypoxia 10/22/2017    Hypertriglyceridemia    Smoker    Lumbar spondylosis 07/22/2011   Myalgia and myositis, unspecified 07/22/2011   PSA (psoriatic arthritis) (HCC) 10/18/2010   HTN (hypertension) 10/18/2010   GAD (generalized anxiety disorder) 10/18/2010     Referrals to Alternative Service(s): Referred to Alternative Service(s):   Place:   Date:   Time:    Referred to Alternative Service(s):   Place:   Date:   Time:    Referred to Alternative Service(s):   Place:   Date:   Time:    Referred to Alternative Service(s):  Place:   Date:   Time:     Vivi Mickey Primus [place], [facility]

## 2024-05-07 NOTE — ED Notes (Signed)
Patient sleeping with no s/s of distress.

## 2024-05-07 NOTE — ED Provider Notes (Incomplete)
 [facility] Urgent Care Continuous Assessment Admission H&P  Date: 05/07/2024 Patient Name: Tim [name]. MRN: [id number] Chief Complaint: IVC  Diagnoses:  Final diagnoses:  Bipolar 1 disorder, mixed, moderate (HCC)   Chart reviewed and discussed with attending psychiatrist, Dr Garvin Gaines.   HPI: Per triage, Tim [name] 44y male escorted by [employer] under IVC. Per the IVC (please review for reference), the respondent stated he wanted to hold a gun to his head and shoot his brains out, states he wants to kill himself, thinks someone is always watching him, has been abusing prescription pain pill on a daily basis and also using some type of synthetic drug, has been diagnosed w/ bipolar manic depression. PT denied SI, HI, AVH and alcohol and substance use. PT mentioned he wanted his medications looked at.   Pt presents via law enforcement after being petitioned for involuntary commitment by his sister, Tim [name] [phone]. Affidavit and petition states: Respondent states he wanted to hold a gun to his head and shoot his brains out. He states he wants to kill himself. He thinks someone is always watching him. He has been abusing prescription pain pills on a daily basis and also using some kind of synthetic drug. He has been diagnosed with bipolar manic depression.   Pt is seen face-to-face on the [facility] Adult treatment area. Pt is seated at the table during this assessment. He requires multiple attempts to arouse patient to participate in today's assessment. Pt states he lives with his mother, sister and 65 yo son. States he is in the process of securing alternate housing in [place], [place] due to his family being too controlled and feels they always have me under their thumb and they think I am crazy. Pt believes his he is being watched in his home via cameras in light fixtures and holes in the his wall. He is adamant about this belief and states he found a camera in the light fixture with a L shaped piece  to it and found a USB camera in a vent. When asked about making a statement that he wanted to hold a gun to his head and shoot his brains out, pt states he did send a text to his sister stating he was going to shoot himself and paint the walls red. He denies intent to end his life and states he verbalized this out of frustration. He denies access to firearms stating that the weapons they inherited from his uncle and states they are locked in a gun safe in his mother's home and that he does not have access to the key and is unable to open the safe. He denies previous suicide attempts. He denies homicidal ideation, intent or plan. Denies AVH.   [id number] Collateral Information obtained from [facility] petitioner and sister Tim [name] [phone] who states pt  threatened to kill himself yesterday. States pt was mad about his headphones and sent petitioner a text which said if these fucking headphones don't start connecting to my meetings I'm going to blow my fucking brains out and paint the walls red instead of this ugly lime color. Petitioner states there are guns in the home and that the weapons are in a gun safe, however, petitioner states the pt has broken into where the key is secured previously. Petitioner states she has caught the patient misusing his prescription Oxycodone  stating she has found straws with pink residue on them and states he has plugged the pills [rectally] in the past. States patient has also  abused Kratom, THC gummies and synthetic weed for the past year. States pt is paranoid and thinks the pin holes (from where there were posters hung on the wall) are cameras and the doors have listening devices telling him to be quiet. States pt feels there are listening devices in his room and he has covered the smoke alarm because he thinks there are cameras in it watching him. She estimates the pt has placed 20 pieces of tapes and inserted 20 screws into the wall because he thinks someone is  watching him. States he also taped the bottom of the TV because he thought someone was watching him.   Petitioner confirms that the patient is planning to move out. States when she caught him misusing pills on Wed he started wanting to move out, however his next housing is identified as a drug house and that patient stayed at a hotel in [place], [place] last night which is a drug hotel. States he was seen by his outpatient psychiatric provider on Tuesday and was telling them he was angry and was trying to Xanax. The provider gave him a low dose of Seroquel  to begin taking in the morning. Due to family's concern regarding pt abusing prescription medication (oxycodone , lyrica , trazodone , seroquel ), the family maintains possession of the patient's medications and administers them to him.   States due to patient's erratic behavior, she told the pt to come to the [employer] under the impression she would be giving him $5,000 for a car and when the patient arrived Total Time spent with patient: 20 minutes  Musculoskeletal  Strength & Muscle Tone: within normal limits Gait & Station: normal Patient leans: N/A  Psychiatric Specialty Exam  Presentation General Appearance: No data recorded Eye Contact:No data recorded Speech:No data recorded Speech Volume:No data recorded Handedness:No data recorded  Mood and Affect  Mood:No data recorded Affect:No data recorded  Thought Process  Thought Processes:No data recorded Descriptions of Associations:No data recorded Orientation:No data recorded Thought Content:No data recorded Diagnosis of Schizophrenia or Schizoaffective disorder in past: No  Duration of Psychotic Symptoms: Less than six months  Hallucinations:No data recorded Ideas of Reference:No data recorded Suicidal Thoughts:No data recorded Homicidal Thoughts:No data recorded  Sensorium  Memory:No data recorded Judgment:No data recorded Insight:No data recorded  Executive Functions   Concentration:No data recorded Attention Span:No data recorded Recall:No data recorded Fund of Knowledge:No data recorded Language:No data recorded  Psychomotor Activity  Psychomotor Activity:No data recorded  Assets  Assets:No data recorded  Sleep  Sleep:No data recorded  No data recorded  Physical Exam Vitals and nursing note reviewed.  HENT:     Head: Normocephalic.     Mouth/Throat:     Mouth: Mucous membranes are moist.  Cardiovascular:     Rate and Rhythm: Normal rate.  Pulmonary:     Effort: Pulmonary effort is normal.  Skin:    General: Skin is warm and dry.  Neurological:     Mental Status: He is alert and oriented to person, place, and time.  Psychiatric:     Comments: See HPI    Review of Systems  Constitutional:  Positive for malaise/fatigue. Negative for fever.  HENT:  Negative for congestion and sore throat.   Respiratory:  Negative for cough and shortness of breath.   Cardiovascular:  Negative for chest pain and palpitations.  Gastrointestinal:  Negative for diarrhea, nausea and vomiting.  Musculoskeletal:  Positive for back pain and joint pain.  Psychiatric/Behavioral:  Positive for suicidal ideas. Negative for  substance abuse.     Blood pressure 105/70, pulse 69, temperature (!) 97.5 F (36.4 C), temperature source Temporal, resp. rate 16, SpO2 99%. There is no height or weight on file to calculate BMI.  Past Psychiatric History: ***  Hospitalization: Rehab at age 56 Is the patient at risk to self? Yes  Has the patient been a risk to self in the past 6 months? Yes .    Has the patient been a risk to self within the distant past? unknown Is the patient a risk to others? Yes   Has the patient been a risk to others in the past 6 months? unknown Has the patient been a risk to others within the distant past? unknown  Past Medical History:  has a past medical history of Avascular necrosis of bone of right hip (HCC), Cervicalgia, COPD (chronic  obstructive pulmonary disease) (HCC), GAD (generalized anxiety disorder), Hypertension, Hypertriglyceridemia, Lumbago, Lumbosacral spondylosis without myelopathy, Myalgia and myositis, unspecified, Other symptoms referable to back, PSA (psoriatic arthritis) (HCC), Smoker, and Thoracic spondylosis without myelopathy.   Family History: family history includes ADD / ADHD in his son; COPD in his father; Cancer in his maternal grandmother, paternal grandmother, and paternal uncle; Diabetes in his father; Hypertension in his father and mother.   Social History:  reports that he has been smoking cigarettes. He started smoking about 27 years ago. He has a 40.5 pack-year smoking history. He has never used smokeless tobacco. He reports current alcohol use. He reports that he does not use drugs.   Last Labs:  Admission on 05/07/2024  Component Date Value Ref Range Status   Glucose-Capillary 05/07/2024 114 (H)  70 - 99 mg/dL Final   Glucose reference range applies only to samples taken after fasting for at least 8 hours.  Office Visit on 04/08/2024  Component Date Value Ref Range Status   WBC 04/08/2024 10.0  3.8 - 10.8 Thousand/uL Final   RBC 04/08/2024 4.41  4.20 - 5.80 Million/uL Final   Hemoglobin 04/08/2024 13.4  13.2 - 17.1 g/dL Final   HCT 87/88/7974 39.9  39.4 - 51.1 % Final   MCV 04/08/2024 90.5  81.4 - 101.7 fL Final   MCH 04/08/2024 30.4  27.0 - 33.0 pg Final   MCHC 04/08/2024 33.6  31.6 - 35.4 g/dL Final   RDW 87/88/7974 13.2  11.0 - 15.0 % Final   Platelets 04/08/2024 300  140 - 400 Thousand/uL Final   MPV 04/08/2024 10.6  7.5 - 12.5 fL Final   Neutro Abs 04/08/2024 8,640 (H)  1,500 - 7,800 cells/uL Final   Absolute Lymphocytes 04/08/2024 910  850 - 3,900 cells/uL Final   Absolute Monocytes 04/08/2024 440  200 - 950 cells/uL Final   Eosinophils Absolute 04/08/2024 0 (L)  15 - 500 cells/uL Final   Basophils Absolute 04/08/2024 10  0 - 200 cells/uL Final   Neutrophils Relative % 04/08/2024  86.4  % Final   Total Lymphocyte 04/08/2024 9.1  % Final   Monocytes Relative 04/08/2024 4.4  % Final   Eosinophils Relative 04/08/2024 0.0  % Final   Basophils Relative 04/08/2024 0.1  % Final   Glucose, Bld 04/08/2024 118 (H)  65 - 99 mg/dL Final   Comment: .            Fasting reference interval . For someone without known diabetes, a glucose value between 100 and 125 mg/dL is consistent with prediabetes and should be confirmed with a follow-up test. .    BUN  04/08/2024 15  7 - 25 mg/dL Final   Creat 87/88/7974 1.07  0.60 - 1.29 mg/dL Final   eGFR 87/88/7974 89  > OR = 60 mL/min/1.35m2 Final   BUN/Creatinine Ratio 04/08/2024 SEE NOTE:  6 - 22 (calc) Final   Comment:    Not Reported: BUN and Creatinine are within    reference range. .    Sodium 04/08/2024 136  135 - 146 mmol/L Final   Potassium 04/08/2024 3.8  3.5 - 5.3 mmol/L Final   Chloride 04/08/2024 100  98 - 110 mmol/L Final   CO2 04/08/2024 28  20 - 32 mmol/L Final   Calcium 04/08/2024 9.7  8.6 - 10.3 mg/dL Final   Total Protein 87/88/7974 6.8  6.1 - 8.1 g/dL Final   Albumin 87/88/7974 4.6  3.6 - 5.1 g/dL Final   Globulin 87/88/7974 2.2  1.9 - 3.7 g/dL (calc) Final   AG Ratio 04/08/2024 2.1  1.0 - 2.5 (calc) Final   Total Bilirubin 04/08/2024 0.3  0.2 - 1.2 mg/dL Final   Alkaline phosphatase (APISO) 04/08/2024 73  36 - 130 U/L Final   AST 04/08/2024 14  10 - 40 U/L Final   ALT 04/08/2024 20  9 - 46 U/L Final   TSH 04/08/2024 0.81  0.40 - 4.50 mIU/L Final   Sed Rate 04/08/2024 19 (H)  0 - 15 mm/h Final   Color, Urine 04/08/2024 YELLOW  YELLOW Final   APPearance 04/08/2024 CLEAR  CLEAR Final   Specific Gravity, Urine 04/08/2024 1.024  1.001 - 1.035 Final   pH 04/08/2024 < OR = 5.0 (A)  5.0 - 8.0 Final   Glucose, UA 04/08/2024 NEGATIVE  NEGATIVE Final   Bilirubin Urine 04/08/2024 NEGATIVE  NEGATIVE Final   Ketones, ur 04/08/2024 NEGATIVE  NEGATIVE Final   Hgb urine dipstick 04/08/2024 NEGATIVE  NEGATIVE Final    Protein, ur 04/08/2024 NEGATIVE  NEGATIVE Final   Nitrite 04/08/2024 NEGATIVE  NEGATIVE Final   Leukocytes,Ua 04/08/2024 NEGATIVE  NEGATIVE Final    Allergies: Ceclor [cefaclor] and Misc. sulfonamide containing compounds  Medications:  Facility Ordered Medications  Medication   acetaminophen  (TYLENOL ) tablet 650 mg   alum & mag hydroxide-simeth (MAALOX/MYLANTA) 200-200-20 MG/5ML suspension 30 mL   magnesium  hydroxide (MILK OF MAGNESIA) suspension 30 mL   haloperidol  (HALDOL ) tablet 5 mg   And   diphenhydrAMINE  (BENADRYL ) capsule 50 mg   haloperidol  lactate (HALDOL ) injection 5 mg   And   diphenhydrAMINE  (BENADRYL ) injection 50 mg   And   LORazepam  (ATIVAN ) injection 2 mg   haloperidol  lactate (HALDOL ) injection 10 mg   And   diphenhydrAMINE  (BENADRYL ) injection 50 mg   And   LORazepam  (ATIVAN ) injection 2 mg   PTA Medications  Medication Sig   busPIRone  (BUSPAR ) 10 MG tablet Take 10 mg by mouth 2 (two) times daily.   fluvoxaMINE  (LUVOX ) 100 MG tablet Take 100 mg by mouth 2 (two) times daily.   QUEtiapine  (SEROQUEL ) 25 MG tablet Take 25 mg by mouth at bedtime.   lamoTRIgine  (LAMICTAL ) 100 MG tablet Take 100 mg by mouth daily.   tiZANidine (ZANAFLEX) 4 MG tablet Take 6 mg by mouth 3 (three) times daily. Take 1-1/2 tabs 3x daily   Oxycodone  HCl 10 MG TABS Take 10 mg by mouth 4 (four) times daily.    traZODone  (DESYREL ) 100 MG tablet Take 100 mg by mouth at bedtime.   pregabalin  (LYRICA ) 75 MG capsule Take 75 mg by mouth 2 (two) times daily.  buPROPion  (WELLBUTRIN  XL) 150 MG 24 hr tablet Take 1 tablet (150 mg total) by mouth daily.   omega-3 acid ethyl esters (LOVAZA ) 1 g capsule Take 1 capsule (1 g total) by mouth 2 (two) times daily.   Omega-3 Fatty Acids (FISH OIL) 1000 MG CAPS Take 1,000 each by mouth. 2 times daily    one in morning   and one in evening   budesonide -formoterol  (SYMBICORT ) 160-4.5 MCG/ACT inhaler Inhale 2 puffs into the lungs in the morning and at bedtime.    lisinopril  (ZESTRIL ) 10 MG tablet TAKE 1 TABLET BY MOUTH EVERY DAY   albuterol  (VENTOLIN  HFA) 108 (90 Base) MCG/ACT inhaler INHALE 2 PUFFS INTO THE LUNGS UP TO EVERY 6HRS AS NEEDED FOR WHEEZING/SHORTNESS OF BREATH   lamoTRIgine  (LAMICTAL ) 25 MG tablet Take 1 tablet (25 mg total) by mouth at bedtime.   doxycycline  (VIBRA -TABS) 100 MG tablet Take 1 tablet (100 mg total) by mouth 2 (two) times daily.      Medical Decision Making  Lab Orders         Glucose, capillary         CBC with Differential/Platelet         Comprehensive metabolic panel         Hemoglobin A1c         Lipid panel         POCT Urine Drug Screen - (I-Screen)     EKG - QTc 446 - EKG discussed with Dr Lawrnce regarding ST and T wave abnormalities. No acute cardiac event present. EKG is comparable to last one obtained in 2024.     Recommendations  Based on my evaluation the patient does not appear to have an emergency medical condition.   Medications Buspar  10 mg PO BID Fluvoxamine  100 mg PO BID Lamotrigine  100 mg PO every day Lisinopril  10 mg PO daily Oxycodone  10 mg QID prn pain Pregabalin  75 mg PO BID Seroquel  400 mg PO at bedtime - will hold for excessive sedation on 05/07/2024 Trazodone  200 mg PO at bedtime - will hold for excessive sedation 05/07/2024   Sherrell Culver, PMHNP-BC, FNP-BC   05/07/2024  3:10 PM

## 2024-05-07 NOTE — Progress Notes (Signed)
" °   05/07/24 1239  [facility] Triage Screening (Walk-ins at [facility] only)  What Is the Reason for Your Visit/Call Today? Tim [name] 43y male escorted by [employer] under IVC. Per the IVC (please review for reference), the respondent stated he wanted to hold a gun to his head and shoot his brains out, states he wants to kill himself, thinks someone is always watching him, has been abusing prescription pain pill on a daily basis and also using some type of synthetic drug, has been diagnosed w/ bipolar manic depression. PT denied SI, HI, AVH and alcohol and substance use. PT mentioned he wanted his medications looked at.  Have You Recently Had Any Thoughts About Hurting Yourself? No  Are You Planning to Commit Suicide/Harm Yourself At This time? No  Have you Recently Had Thoughts About Hurting Someone Sherral? No  Are You Planning To Harm Someone At This Time? No  Physical Abuse Denies  Verbal Abuse Denies  Sexual Abuse Denies  Self-Neglect Yes, present (Comment)  Are you currently experiencing any auditory, visual or other hallucinations? No  Have You Used Any Alcohol or Drugs in the Past 24 Hours? No  Do you have any current medical co-morbidities that require immediate attention?  (pt states he has an enlarged heart that causes chest pain)  Clinician description of patient physical appearance/behavior: calm, cooperative, lazy/sleep mood-affect  What Do You Feel Would Help You the Most Today? Treatment for Depression or other mood problem;Medication(s)  Determination of Need Urgent (48 hours)  Options For Referral [facility] Urgent Care;Intensive Outpatient Therapy;Medication Management;Outpatient Therapy;Inpatient Hospitalization  Determination of Need filed? Yes    "

## 2024-05-07 NOTE — ED Notes (Signed)
 Pt is IVC for Aggression/SI at home (to shoot himself)and is admitted to [facility] for observation. Pt cooperative with admission process. No contraband. Q15 min safety check in place. Behavior controlled. Pt contracted to safety. Endorsed.

## 2024-05-08 DIAGNOSIS — F3162 Bipolar disorder, current episode mixed, moderate: Secondary | ICD-10-CM | POA: Diagnosis not present

## 2024-05-08 MED ORDER — FLUTICASONE FUROATE-VILANTEROL 200-25 MCG/ACT IN AEPB: 1.0000 | INHALATION_SPRAY | Freq: Every day | RESPIRATORY_TRACT | Status: DC

## 2024-05-08 MED ORDER — ALBUTEROL SULFATE HFA 108 (90 BASE) MCG/ACT IN AERS
2.0000 | INHALATION_SPRAY | Freq: Four times a day (QID) | RESPIRATORY_TRACT | Status: DC | PRN
  Administered 2024-05-08: 2 via RESPIRATORY_TRACT
  Filled 2024-05-08: qty 6.7

## 2024-05-08 NOTE — ED Notes (Signed)
 Patient requesting respiratory meds that are not on his MAR from his locker. NP made advised pending medication reconcilation/order.

## 2024-05-08 NOTE — ED Notes (Signed)
 Patient discharged home, IVC rescinded. Patient belongings returned complete and intact. Staff escorted patient to lobby for transport to their location. Safety maintained.

## 2024-05-08 NOTE — ED Notes (Signed)
 Patient alert and oriented, given decaf coffee with no s/s of distress. Pain reassessed. Denies SI/HI/AV/VH.

## 2024-05-08 NOTE — ED Notes (Signed)
 Reported fall in shower, safety zone [phone] submitted. Ene, NP notified and asked to come assess patient for any harm.

## 2024-05-08 NOTE — ED Notes (Signed)
Patient resting comfortably with no s/s of distress.

## 2024-05-08 NOTE — ED Notes (Signed)
 Patient came up to this writer numerous times this morning. Asking for the dr, to be discharged, for food, pain medicine, his cell phone, other items out of his locker. Staff let patient know that we are working on everything that he needs in a timely manor. Patient also came up to nurse station giving the NP a time limit to be discharged before he loses his shit. Patient very agitated and irritable also stating that he will not be paying his bill.

## 2024-05-08 NOTE — ED Provider Notes (Addendum)
 Pt reported an unwitnessed fall while in the bathroom. This provider came to the unit to assess the pt. Pt reports hitting his head during the fall. He reports head pain and right hip pain rated 10/10. RN made aware to give prn pain med.  Vital signs: Blood pressure (!) 126/94, pulse (!) 126, temperature 99.4 F (37.4 C), temperature source Oral, resp. rate 18, SpO2 99%  time of assessment. EDP provider Dr. Midge was contacted for transfer pt to the ED for medical clearance.  /

## 2024-05-08 NOTE — Discharge Instructions (Addendum)
 Discharge Recommendations:   Medications: Patient is to take medications as prescribed. The patient or patient's guardian is to contact a medical professional and/or outpatient provider to address any new side effects that develop. The patient or the patient's guardian should update outpatient providers of any new medications and/or medication changes.   Outpatient Follow up: Please follow up with you outpatient provider, Laymon Glatter, NP at [facility] for your next scheduled appointment next week.   Continue daily Narcotics Anonymous meetings  You declined to be sent to the ED for evaluation after a fall. It is recommended that you seek immediate medical attention should you experience headache, dizziness, confusion, blurred vision, mood changes, vomiting, slurred speech, or issues with balance.   Therapy: We recommend that patient participate in individual therapy to address mental health concerns.  Atypical antipsychotics: If you are prescribed an atypical antipsychotic (Seroquel  (quetiapine )), it is recommended that your height, weight, BMI, blood pressure, fasting lipid panel, and fasting blood sugar be monitored by your outpatient providers.  Safety:   The following safety precautions should be taken:   No sharp objects. This includes scissors, razors, scrapers, and putty knives.   Chemicals should be removed and locked up.   Medications should be removed and locked up.   Weapons should be removed and locked up. This includes firearms, knives and instruments that can be used to cause injury.   The patient should abstain from use of illicit substances/drugs and abuse of any medications.  If symptoms worsen or do not continue to improve or if the patient becomes actively suicidal or homicidal then it is recommended that the patient return to the closest hospital emergency department, the [facility], or call 911 for  further evaluation and treatment.  National Suicide Prevention Lifeline 1-800-SUICIDE or (413)217-8187.  About 988 988 offers 24/7 access to trained crisis counselors who can help people experiencing mental health-related distress. People can call or text 988 or chat 988lifeline.org for themselves or if they are worried about a loved one who may need crisis support.

## 2024-05-09 NOTE — ED Provider Notes (Signed)
 FBC/OBS ASAP Discharge Summary  Date and Time: 05/09/2024 10:15 AM  Name: Tim [name].  MRN:  [id number]   Discharge Diagnoses:  Final diagnoses:  Bipolar 1 disorder, mixed, moderate (HCC)   Chart reviewed and discussed with attending psychiatrist, Dr Garvin Gaines.   Subjective: I feel so much better   Stay Summary: Per triage on 05/07/2024, Tim [name] 44y male escorted by [employer] under IVC. Per the IVC (please review for reference), the respondent stated he wanted to hold a gun to his head and shoot his brains out, states he wants to kill himself, thinks someone is always watching him, has been abusing prescription pain pill on a daily basis and also using some type of synthetic drug, has been diagnosed w/ bipolar manic depression. PT denied SI, HI, AVH and alcohol and substance use. PT mentioned he wanted his medications looked at.   Pt [facility] on [facility]/08/2024 after he was petitioned for IVC by his sister. Pt was extremely drowsy on presentation and required multiple attempts to arouse patient to participate in the assessment.  Patient attributed IVC to his family being too controlling.  On assessment patient was exhibiting paranoia in the context of feeling that he was being watched in his home via cameras in the light fixtures and holes in his wall.  He was adamant about the belief and states he found a camera in the light fixture.  Patient endorsed making a statement that he wanted to hold a gun to his head and shoot his brains out, patient states he did send a text message to his sister stating he was going to shoot himself and paint the walls read.  Patient denied this was an intent to end his life and states he verbalized this out of frustration.  He denied access to firearms stating that the weapons in the home were inherited from a family member and that weapons were currently locked in a gun safe in his mother's home and that he did not have access to the key and is unable  to open the gun safe.  Patient denied previous suicide attempts.  He denied homicidal ideation intent or plan.  He denied AVH.  Collateral information was obtained from the IVC petitioner and the patient's sister.  Based on collateral information obtained from petitioner as well as patient's current presentation, patient was admitted to the [facility] see continuous assessment unit overnight for continuous observation for reassessment today to determine the clinically appropriate level of care.  On arrival to the [facility] continuous observation unit, pt is observed sitting up in a chair and talking to another peer on the unit. Pt is smiling and laughing. Pt is seen face-to-face by this practitioner on morning rounds. Pt is alert & oriented x 4 and engages in today's visit. He is much more alert than on previous assessment on 05/07/2024.  Today, patient states I feel so much better. States he took 2 sleeping pills that I had in my pocket yesterday prior to arrival which contributed to his excessive sedation. States that he slept a great deal throughout the afternoon and sleep ok overnight due to hip pain. He is asked the reason he was brought in for evaluation and he states I  had sent a text to my sister telling her I was going to repaint the room. States he said this out of frustration. Describes conflict with his family, I'm under their thumb and states he wants to move out into his own housing.  At this time he denies suicidal or homicidal ideation, intent or plan. He denies AVH.   Patient sustained a fall earlier during the shift and was assessed by overnight NP and was recommended to be sent to ER which had not occurred prior to rounding on day of discharge. Pt declines to be sent to ER stating I'm alright. On assessment, pt does not have any edema to the scalp, there is a small amount of tenderness to palpation on the right parietal area. He again declines to go to he ER for evaluation and is requesting  discharge home. Again, he adamantly denies suicidal ideation, intent or plan. Denies AVH. Pt is advised that is is recommended that the pt seek immediate medical attention should he experience headache, dizziness, confusion, blurred vision, mood changes, vomiting, slurred speech, or issues with balance. Pt verbalized understanding.  Patient is currently stable, alert and oriented x 4, and has been cooperative with treatment.  Patient demonstrates adequate insight and judgment and is not exhibiting behaviors or symptoms that would warrant continued involuntary hospitalization.  Patient has expressed a willingness to continue voluntary outpatient follow-up care with current outpatient psychiatric provider and substance use treatment through [facility] and he has an appropriate discharge plan in place.  There is no evidence of imminent risk to self or others at this time.  No acute safety concerns or active suicidal ideation are present at the time of discharge. The petition for involuntary commitment will be rescinded and patient will be discharged home.  Total Time spent with patient: 15 minutes  Past Psychiatric History: Bipolar disorder, history of Kratom use, history of cannabis use Hospitalization: Rehab at age 89 Past Medical History:  has a past medical history of Avascular necrosis of bone of right hip (HCC), Cervicalgia, COPD (chronic obstructive pulmonary disease) (HCC), GAD (generalized anxiety disorder), Hypertension, Hypertriglyceridemia, Lumbago, Lumbosacral spondylosis without myelopathy, Myalgia and myositis, unspecified, Other symptoms referable to back, PSA (psoriatic arthritis) (HCC), Smoker, and Thoracic spondylosis without myelopathy.    Family History: family history includes ADD / ADHD in his son; COPD in his father; Cancer in his maternal grandmother, paternal grandmother, and paternal uncle; Diabetes in his father; Hypertension in his father and mother.    Social History:   reports that he has been smoking cigarettes. He started smoking about 27 years ago. He has a 40.5 pack-year smoking history. He has never used smokeless tobacco. He reports current alcohol use. He reports that he does not use drugs.  Tobacco Cessation:  A prescription for an FDA-approved tobacco cessation medication was offered at discharge and the patient refused  Current Medications:  No current facility-administered medications for this encounter.   Current Outpatient Medications  Medication Sig Dispense Refill   albuterol  (VENTOLIN  HFA) 108 (90 Base) MCG/ACT inhaler INHALE 2 PUFFS INTO THE LUNGS UP TO EVERY 6HRS AS NEEDED FOR WHEEZING/SHORTNESS OF BREATH (Patient taking differently: Inhale 2 puffs into the lungs every 6 (six) hours as needed for wheezing or shortness of breath.) 18 each 3   budesonide -formoterol  (SYMBICORT ) 160-4.5 MCG/ACT inhaler Inhale 2 puffs into the lungs in the morning and at bedtime. 20.4 each 3   busPIRone  (BUSPAR ) 10 MG tablet Take 10 mg by mouth 2 (two) times daily.     fluvoxaMINE  (LUVOX ) 100 MG tablet Take 100 mg by mouth 2 (two) times daily.     lamoTRIgine  (LAMICTAL ) 100 MG tablet Take 100 mg by mouth daily.     lisinopril  (ZESTRIL ) 10 MG tablet TAKE 1  TABLET BY MOUTH EVERY DAY 90 tablet 0   omega-3 acid ethyl esters (LOVAZA ) 1 g capsule Take 1 capsule (1 g total) by mouth 2 (two) times daily. (Patient taking differently: Take 1 g by mouth daily.) 180 capsule 3   Oxycodone  HCl 10 MG TABS Take 10 mg by mouth See admin instructions. Takes five times daily as needed for pain     pregabalin  (LYRICA ) 75 MG capsule Take 75 mg by mouth 2 (two) times daily.     QUEtiapine  (SEROQUEL ) 25 MG tablet Take 25 mg by mouth at bedtime.     tiZANidine (ZANAFLEX) 4 MG tablet Take 6 mg by mouth every 8 (eight) hours as needed for muscle spasms.     traZODone  (DESYREL ) 100 MG tablet Take 200 mg by mouth at bedtime.      PTA Medications:  PTA Medications  Medication Sig    tiZANidine (ZANAFLEX) 4 MG tablet Take 6 mg by mouth every 8 (eight) hours as needed for muscle spasms.   Oxycodone  HCl 10 MG TABS Take 10 mg by mouth See admin instructions. Takes five times daily as needed for pain   traZODone  (DESYREL ) 100 MG tablet Take 200 mg by mouth at bedtime.   pregabalin  (LYRICA ) 75 MG capsule Take 75 mg by mouth 2 (two) times daily.   omega-3 acid ethyl esters (LOVAZA ) 1 g capsule Take 1 capsule (1 g total) by mouth 2 (two) times daily. (Patient taking differently: Take 1 g by mouth daily.)   budesonide -formoterol  (SYMBICORT ) 160-4.5 MCG/ACT inhaler Inhale 2 puffs into the lungs in the morning and at bedtime.   lisinopril  (ZESTRIL ) 10 MG tablet TAKE 1 TABLET BY MOUTH EVERY DAY   albuterol  (VENTOLIN  HFA) 108 (90 Base) MCG/ACT inhaler INHALE 2 PUFFS INTO THE LUNGS UP TO EVERY 6HRS AS NEEDED FOR WHEEZING/SHORTNESS OF BREATH (Patient taking differently: Inhale 2 puffs into the lungs every 6 (six) hours as needed for wheezing or shortness of breath.)   busPIRone  (BUSPAR ) 10 MG tablet Take 10 mg by mouth 2 (two) times daily.   fluvoxaMINE  (LUVOX ) 100 MG tablet Take 100 mg by mouth 2 (two) times daily.   QUEtiapine  (SEROQUEL ) 25 MG tablet Take 25 mg by mouth at bedtime.   lamoTRIgine  (LAMICTAL ) 100 MG tablet Take 100 mg by mouth daily.       04/08/2024   11:12 AM 08/14/2023   11:10 AM 04/11/2022   10:36 AM  Depression screen PHQ 2/9  Decreased Interest 0 2 0  Down, Depressed, Hopeless 0 1 0  PHQ - 2 Score 0 3 0  Altered sleeping  2   Tired, decreased energy  3   Change in appetite  1   Feeling bad or failure about yourself   1   Trouble concentrating  2   Moving slowly or fidgety/restless  0   Suicidal thoughts  0   PHQ-9 Score  12    Difficult doing work/chores  Not difficult at all      Data saved with a previous flowsheet row definition    Flowsheet Row ED from 05/07/2024 in [facility] ED from 05/22/2020 in [facility]  Department at [facility]  C-SSRS RISK CATEGORY No Risk No Risk    Musculoskeletal  Strength & Muscle Tone: within normal limits Gait & Station: normal Patient leans: N/A  Psychiatric Specialty Exam  Presentation  General Appearance:  Appropriate for Environment; Fairly Groomed  Eye Contact: Fair  Speech: Clear and  Coherent; Normal Rate  Speech Volume: Normal  Handedness:No data recorded  Mood and Affect  Mood:No data recorded Affect: Flat   Thought Process  Thought Processes: Coherent  Descriptions of Associations:Intact  Orientation:Full (Time, Place and Person)  Thought Content:Logical  Diagnosis of Schizophrenia or Schizoaffective disorder in past: No  Duration of Psychotic Symptoms: Less than six months   Hallucinations:No data recorded Ideas of Reference:None  Suicidal Thoughts:No data recorded Homicidal Thoughts:No data recorded  Sensorium  Memory: Immediate Fair; Recent Fair  Judgment: Fair  Insight: Fair   Art Therapist  Concentration: Fair  Attention Span: Fair  Recall: [employer] of Knowledge: Fair  Language: Fair   Psychomotor Activity  Psychomotor Activity:No data recorded  Assets  Assets: Communication Skills; Desire for Improvement; Housing; Resilience   Sleep  Sleep:No data recorded No Safety Checks orders active in given range  No data recorded  Physical Exam  Physical Exam Vitals and nursing note reviewed.  HENT:     Head: Normocephalic.     Comments: No swelling palpated. Has tenderness on the right parietal area.     Mouth/Throat:     Mouth: Mucous membranes are moist.  Cardiovascular:     Rate and Rhythm: Normal rate.  Pulmonary:     Effort: Pulmonary effort is normal.  Musculoskeletal:     Cervical back: Normal range of motion.  Skin:    General: Skin is warm and dry.  Neurological:     Mental Status: He is alert and oriented to person, place, and time.  Psychiatric:      Comments: See HPI    Review of Systems  Constitutional:  Negative for chills and fever.  HENT:  Negative for congestion and sore throat.   Respiratory:  Negative for cough and shortness of breath.   Cardiovascular:  Negative for chest pain and palpitations.  Gastrointestinal:  Negative for diarrhea, nausea and vomiting.  Musculoskeletal:        Right hip pain - baseline pain prior to arrival  Psychiatric/Behavioral:  Negative for depression, hallucinations and suicidal ideas. The patient is not nervous/anxious.    Blood pressure 106/81, pulse 72, temperature 98 F (36.7 C), temperature source Oral, resp. rate 18, SpO2 99%. There is no height or weight on file to calculate BMI.  Demographic Factors:  Male and Caucasian  Loss Factors: Financial problems/change in socioeconomic status  Historical Factors: NA  Risk Reduction Factors:   Living with another person, especially a relative, Positive social support, and Positive therapeutic relationship  Continued Clinical Symptoms:  Chronic Pain, history of substance abuse and currently with substance dependence   Cognitive Features That Contribute To Risk:  None    Suicide Risk:  Minimal: No identifiable suicidal ideation.  Patients presenting with no risk factors but with morbid ruminations; may be classified as minimal risk based on the severity of the depressive symptoms  Plan Of Care/Follow-up recommendations:  Activity: as tolerated  Diet:  regular Tests:  as determined by outpatient psychiatric provider/PCP  Disposition: Discharge home.  First examination completed and pt released from IVC. First exam faxed to magistrate. Pt was advised to follow up with outpatient provider, Laymon Glatter, NP at [facility] for ongoing mental health treatment. Advised pt continue Narcotics Anonymous meetings.   Sherrell Culver, PMHNP-BC, FNP-BC  05/09/2024, 10:15 AM

## 2024-05-27 ENCOUNTER — Other Ambulatory Visit: Payer: Self-pay | Admitting: Family Medicine

## 2024-05-27 DIAGNOSIS — I10 Essential (primary) hypertension: Secondary | ICD-10-CM

## 2024-07-16 ENCOUNTER — Encounter: Admitting: Family Medicine

## 2024-08-19 ENCOUNTER — Encounter
